# Patient Record
Sex: Female | Born: 1985 | Hispanic: No | Marital: Single | Smoking: Never smoker
Health system: Southern US, Community
[De-identification: ages and names within clinical notes are randomized; demographics above are authoritative.]

## PROBLEM LIST (undated history)

## (undated) DIAGNOSIS — G809 Cerebral palsy, unspecified: Secondary | ICD-10-CM

## (undated) DIAGNOSIS — K219 Gastro-esophageal reflux disease without esophagitis: Secondary | ICD-10-CM

## (undated) HISTORY — PX: OTHER SURGICAL HISTORY: SHX169

---
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
## 2007-07-20 ENCOUNTER — Encounter: Admission: RE | Admit: 2007-07-20 | Discharge: 2007-09-10 | Payer: Self-pay | Admitting: Physician Assistant

## 2011-12-24 ENCOUNTER — Emergency Department (HOSPITAL_COMMUNITY)
Admission: EM | Admit: 2011-12-24 | Discharge: 2011-12-24 | Disposition: A | Discharge status: Home / Self Care | Payer: Medicaid Other | Attending: Emergency Medicine | Admitting: Emergency Medicine

## 2011-12-24 ENCOUNTER — Encounter: Payer: Self-pay | Admitting: *Deleted

## 2011-12-24 DIAGNOSIS — L8992 Pressure ulcer of unspecified site, stage 2: Secondary | ICD-10-CM | POA: Insufficient documentation

## 2011-12-24 DIAGNOSIS — G809 Cerebral palsy, unspecified: Secondary | ICD-10-CM | POA: Insufficient documentation

## 2011-12-24 DIAGNOSIS — L8990 Pressure ulcer of unspecified site, unspecified stage: Secondary | ICD-10-CM | POA: Insufficient documentation

## 2011-12-24 HISTORY — DX: Gastro-esophageal reflux disease without esophagitis: K21.9

## 2011-12-24 HISTORY — DX: Cerebral palsy, unspecified: G80.9

## 2011-12-24 MED ORDER — DUODERM CGF DRESSING EX MISC: 1.0000 | Freq: Once | CUTANEOUS | Status: DC

## 2011-12-24 NOTE — ED Notes (Signed)
Restore placed to LT sided stage 2

## 2011-12-24 NOTE — ED Notes (Signed)
Pt has returned from bathroom on w/c.  Family assisting her back into stretcher bed.

## 2011-12-24 NOTE — ED Notes (Signed)
Pt states "noticed x days ago, where your butt meet your hip is open, also on right foot, there are dark spots"

## 2011-12-24 NOTE — ED Notes (Signed)
Spoke w/AC  About getting a duoderm or equivalent for the pt.  She will be bringing one to the floor shortly.

## 2011-12-24 NOTE — ED Provider Notes (Signed)
History     CSN: [id number]  Arrival date & time 12/24/11  1316   First MD Initiated Contact with Patient 12/24/11 1714      Chief Complaint  Patient presents with  . Skin Ulcer    (Consider location/radiation/quality/duration/timing/severity/associated sxs/prior treatment) HPI Comments: Patient with PMH significant for Cerebral Palsy.  She is currently wheel chair bound.  She comes in today today with a complaint of a skin ulcer on the left side of her lower buttocks.  Her father reports that she gets these sores frequently.  Patient reports that she usually applies barrier cream to the area and then it will resolve.  This ulcer has been present for 2 days.  She denies any drainage from the area.  She denies any fever/chills.  She reports that the area is tender to the touch.  The history is provided by the patient and a parent.    Past Medical History  Diagnosis Date  . Cerebral palsy   . Acid reflux     Past Surgical History  Procedure Date  . Dorsal rhizotomy   . Left arm surgery     No family history on file.  History  Substance Use Topics  . Smoking status: Never Smoker   . Smokeless tobacco: Not on file  . Alcohol Use: No    OB History    Grav Para Term Preterm Abortions TAB SAB Ect Mult Living                  Review of Systems  Constitutional: Negative for fever, chills and diaphoresis.  Respiratory: Negative for shortness of breath.   Gastrointestinal: Negative for nausea, vomiting and abdominal pain.  Musculoskeletal: Negative for back pain.  Skin: Positive for wound.    Allergies  Review of patient's allergies indicates no known allergies.  Home Medications   Current Outpatient Rx  Name Route Sig Dispense Refill  . NAPROXEN 250 MG PO TABS Oral Take 250 mg by mouth 2 (two) times daily with a meal.        BP 83/43  Pulse 71  Temp(Src) 98.4 F (36.9 C) (Oral)  Resp 17  Wt 132 lb 4.4 oz (60 kg)  SpO2 100%  LMP 12/21/2011  Physical  Exam  Nursing note and vitals reviewed. Constitutional: She is oriented to person, place, and time. She appears well-developed and well-nourished. No distress.  HENT:  Head: Normocephalic and atraumatic.  Cardiovascular: Normal rate, regular rhythm and normal heart sounds.   Pulmonary/Chest: Effort normal and breath sounds normal. No respiratory distress.  Abdominal: Soft. There is no tenderness.  Neurological: She is alert and oriented to person, place, and time.  Skin: Skin is warm and dry. She is not diaphoretic.     Psychiatric: She has a normal mood and affect.    ED Course  Procedures (including critical care time)  Labs Reviewed - No data to display No results found.   1. Pressure ulcer stage II     Duoderm applied to the wound.    MDM  Duoderm applied to the area.  Pressure ulcer was stage II.  Therefore, debridment was not needed at this time.   Patient instructed to follow up with Primary Care Physician.        Pascal Lux [facility] 12/27/11 1950

## 2011-12-24 NOTE — ED Notes (Signed)
Patient transported to bathroom on w/c.

## 2011-12-29 NOTE — ED Provider Notes (Signed)
Medical screening examination/treatment/procedure(s) were performed by non-physician practitioner and as supervising physician I was immediately available for consultation/collaboration.   Gerhard Munch, MD 12/29/11 1247

## 2016-05-23 ENCOUNTER — Encounter (HOSPITAL_COMMUNITY): Payer: Self-pay | Admitting: Emergency Medicine

## 2016-05-23 ENCOUNTER — Emergency Department (HOSPITAL_COMMUNITY)
Admission: EM | Admit: 2016-05-23 | Discharge: 2016-05-23 | Disposition: A | Discharge status: Home / Self Care | Payer: Medicaid Other | Attending: Emergency Medicine | Admitting: Emergency Medicine

## 2016-05-23 DIAGNOSIS — N939 Abnormal uterine and vaginal bleeding, unspecified: Secondary | ICD-10-CM | POA: Diagnosis present

## 2016-05-23 DIAGNOSIS — Z791 Long term (current) use of non-steroidal anti-inflammatories (NSAID): Secondary | ICD-10-CM | POA: Diagnosis not present

## 2016-05-23 DIAGNOSIS — N760 Acute vaginitis: Secondary | ICD-10-CM | POA: Diagnosis not present

## 2016-05-23 DIAGNOSIS — Z7982 Long term (current) use of aspirin: Secondary | ICD-10-CM | POA: Diagnosis not present

## 2016-05-23 DIAGNOSIS — B9689 Other specified bacterial agents as the cause of diseases classified elsewhere: Secondary | ICD-10-CM

## 2016-05-23 DIAGNOSIS — R300 Dysuria: Secondary | ICD-10-CM | POA: Diagnosis not present

## 2016-05-23 LAB — CBC WITH DIFFERENTIAL/PLATELET
BASOS ABS: 0 10*3/uL (ref 0.0–0.1)
Basophils Relative: 0 %
EOS ABS: 0.1 10*3/uL (ref 0.0–0.7)
Eosinophils Relative: 1 %
HEMATOCRIT: 31.5 % — AB (ref 36.0–46.0)
Hemoglobin: 8.7 g/dL — ABNORMAL LOW (ref 12.0–15.0)
LYMPHS ABS: 2.8 10*3/uL (ref 0.7–4.0)
Lymphocytes Relative: 30 %
MCH: 16 pg — AB (ref 26.0–34.0)
MCHC: 27.6 g/dL — AB (ref 30.0–36.0)
MCV: 57.9 fL — ABNORMAL LOW (ref 78.0–100.0)
Monocytes Absolute: 0.5 10*3/uL (ref 0.1–1.0)
Monocytes Relative: 5 %
NEUTROS ABS: 6 10*3/uL (ref 1.7–7.7)
Neutrophils Relative %: 64 %
Platelets: 187 10*3/uL (ref 150–400)
RBC: 5.44 MIL/uL — ABNORMAL HIGH (ref 3.87–5.11)
RDW: 21.1 % — AB (ref 11.5–15.5)
WBC: 9.4 10*3/uL (ref 4.0–10.5)

## 2016-05-23 LAB — WET PREP, GENITAL
SPERM: NONE SEEN
TRICH WET PREP: NONE SEEN
YEAST WET PREP: NONE SEEN

## 2016-05-23 LAB — BASIC METABOLIC PANEL
Anion gap: 7 (ref 5–15)
BUN: 20 mg/dL (ref 6–20)
CALCIUM: 9.1 mg/dL (ref 8.9–10.3)
CO2: 24 mmol/L (ref 22–32)
CREATININE: 0.39 mg/dL — AB (ref 0.44–1.00)
Chloride: 105 mmol/L (ref 101–111)
GFR calc non Af Amer: >60 mL/min (ref >60)
Glucose, Bld: 88 mg/dL (ref 65–99)
Potassium: 3.8 mmol/L (ref 3.5–5.1)
SODIUM: 136 mmol/L (ref 135–145)

## 2016-05-23 LAB — URINE MICROSCOPIC-ADD ON

## 2016-05-23 LAB — I-STAT BETA HCG BLOOD, ED (MC, WL, AP ONLY): I-stat hCG, quantitative: <5 m[IU]/mL (ref <5)

## 2016-05-23 LAB — URINALYSIS, ROUTINE W REFLEX MICROSCOPIC
Bilirubin Urine: NEGATIVE
Glucose, UA: NEGATIVE mg/dL
Ketones, ur: NEGATIVE mg/dL
NITRITE: NEGATIVE
Protein, ur: 30 mg/dL — AB
SPECIFIC GRAVITY, URINE: 1.03 (ref 1.005–1.030)
pH: 6 (ref 5.0–8.0)

## 2016-05-23 MED ORDER — METRONIDAZOLE 500 MG PO TABS: 500.0000 mg | ORAL_TABLET | Freq: Two times a day (BID) | ORAL | Status: DC

## 2016-05-23 NOTE — ED Provider Notes (Signed)
CSN: [id number]     Arrival date & time 05/23/16  1312 History   First MD Initiated Contact with Patient 05/23/16 1433     Chief Complaint  Patient presents with  . Vaginal Bleeding     (Consider location/radiation/quality/duration/timing/severity/associated sxs/prior Treatment) HPI Comments: Patient with PMH of cerebral palsy presents to the ED with a chief complaint of vaginal bleeding.  She states that she has been having bleeding for the past 2 weeks.  States that it felt like her normal menstrual cycle, but lasted longer.  Bleeding has now slowed down, but she still complains of some pain.  She states that it now hurts when she sits and when she urinates.  She denies SOB, dizziness, chest pain, or abdominal pain.  There are no modifying factors.  The history is provided by the patient. No language interpreter was used.    Past Medical History  Diagnosis Date  . Cerebral palsy (HCC)   . Acid reflux    Past Surgical History  Procedure Laterality Date  . Dorsal rhizotomy    . Left arm surgery     No family history on file. Social History  Substance Use Topics  . Smoking status: Never Smoker   . Smokeless tobacco: None  . Alcohol Use: No   OB History    No data available     Review of Systems  Constitutional: Negative for fever and chills.  Respiratory: Negative for shortness of breath.   Cardiovascular: Negative for chest pain.  Gastrointestinal: Negative for nausea, vomiting, diarrhea and constipation.  Genitourinary: Positive for dysuria and vaginal bleeding.  All other systems reviewed and are negative.     Allergies  Review of patient's allergies indicates no known allergies.  Home Medications   Prior to Admission medications   Medication Sig Start Date End Date Taking? Authorizing Provider  Control Gel Formula Dressing (DUODERM CGF DRESSING) MISC Apply 1 each topically once. 12/24/11   Heather Laisure, PA-C  naproxen (NAPROSYN) 250 MG tablet Take 250 mg  by mouth 2 (two) times daily with a meal.      Historical Provider, MD   BP 128/75 mmHg  Pulse 96  Temp(Src) 98.4 F (36.9 C) (Oral)  Resp 18  SpO2 100% Physical Exam  Constitutional: She is oriented to person, place, and time. She appears well-developed and well-nourished.  HENT:  Head: Normocephalic and atraumatic.  Eyes: Conjunctivae and EOM are normal. Pupils are equal, round, and reactive to light.  Neck: Normal range of motion. Neck supple.  Cardiovascular: Normal rate and regular rhythm.  Exam reveals no gallop and no friction rub.   No murmur heard. Pulmonary/Chest: Effort normal and breath sounds normal. No respiratory distress. She has no wheezes. She has no rales. She exhibits no tenderness.  Abdominal: Soft. Bowel sounds are normal. She exhibits no distension and no mass. There is no tenderness. There is no rebound and no guarding.  Genitourinary:  Patient unable to tolerate speculum exam, no active bleeding, on visual inspection and bimanual exam, non adnexal tenderness, external exam is remarkable a few small lesions which look like warts, no evidence of herpes  Musculoskeletal: She exhibits no edema or tenderness.  Minimal ROM and movement of lower extremities (baseline)  Neurological: She is alert and oriented to person, place, and time.  Skin: Skin is warm and dry.  Psychiatric: She has a normal mood and affect. Her behavior is normal. Judgment and thought content normal.  Nursing note and vitals reviewed.   ED  Course  Procedures (including critical care time) Results for orders placed or performed during the hospital encounter of 05/23/16  Wet prep, genital  Result Value Ref Range   Yeast Wet Prep HPF POC NONE SEEN NONE SEEN   Trich, Wet Prep NONE SEEN NONE SEEN   Clue Cells Wet Prep HPF POC PRESENT (A) NONE SEEN   WBC, Wet Prep HPF POC FEW (A) NONE SEEN   Sperm NONE SEEN   Urinalysis, Routine w reflex microscopic (not at [facility])  Result Value Ref Range    Color, Urine YELLOW YELLOW   APPearance HAZY (A) CLEAR   Specific Gravity, Urine 1.030 1.005 - 1.030   pH 6.0 5.0 - 8.0   Glucose, UA NEGATIVE NEGATIVE mg/dL   Hgb urine dipstick LARGE (A) NEGATIVE   Bilirubin Urine NEGATIVE NEGATIVE   Ketones, ur NEGATIVE NEGATIVE mg/dL   Protein, ur 30 (A) NEGATIVE mg/dL   Nitrite NEGATIVE NEGATIVE   Leukocytes, UA TRACE (A) NEGATIVE  CBC with Differential/Platelet  Result Value Ref Range   WBC 9.4 4.0 - 10.5 K/uL   RBC 5.44 (H) 3.87 - 5.11 MIL/uL   Hemoglobin 8.7 (L) 12.0 - 15.0 g/dL   HCT 16.1 (L) 09.6 - 04.5 %   MCV 57.9 (L) 78.0 - 100.0 fL   MCH 16.0 (L) 26.0 - 34.0 pg   MCHC 27.6 (L) 30.0 - 36.0 g/dL   RDW 40.9 (H) 81.1 - 91.4 %   Platelets 187 150 - 400 K/uL   Neutrophils Relative % 64 %   Lymphocytes Relative 30 %   Monocytes Relative 5 %   Eosinophils Relative 1 %   Basophils Relative 0 %   Neutro Abs 6.0 1.7 - 7.7 K/uL   Lymphs Abs 2.8 0.7 - 4.0 K/uL   Monocytes Absolute 0.5 0.1 - 1.0 K/uL   Eosinophils Absolute 0.1 0.0 - 0.7 K/uL   Basophils Absolute 0.0 0.0 - 0.1 K/uL   RBC Morphology ELLIPTOCYTES   Basic metabolic panel  Result Value Ref Range   Sodium 136 135 - 145 mmol/L   Potassium 3.8 3.5 - 5.1 mmol/L   Chloride 105 101 - 111 mmol/L   CO2 24 22 - 32 mmol/L   Glucose, Bld 88 65 - 99 mg/dL   BUN 20 6 - 20 mg/dL   Creatinine, Ser 7.82 (L) 0.44 - 1.00 mg/dL   Calcium 9.1 8.9 - 95.6 mg/dL   GFR calc non Af Amer >60 >60 mL/min   GFR calc Af Amer >60 >60 mL/min   Anion gap 7 5 - 15  Urine microscopic-add on  Result Value Ref Range   Squamous Epithelial / LPF 6-30 (A) NONE SEEN   WBC, UA 0-5 0 - 5 WBC/hpf   RBC / HPF 6-30 0 - 5 RBC/hpf   Bacteria, UA FEW (A) NONE SEEN   Urine-Other LESS THAN 10 mL OF URINE SUBMITTED   I-Stat Beta hCG blood, ED ([facility], [facility], [facility] only)  Result Value Ref Range   I-stat hCG, quantitative <5.0 <5 mIU/mL   Comment 3            I have personally reviewed and evaluated these lab results as part  of my medical decision-making.    MDM   Final diagnoses:  Abnormal uterine bleeding  BV (bacterial vaginosis)    Patient with complaint of vaginal bleeding and dysuria. The bleeding has mostly stopped the past few days, but was heavier than normal. Hemoglobin is 8.7, I have nothing to  compare this to. She is not tachycardic nor hypotensive.  Wet prep remarkable for clue cells. Will treat with vaginitis resolved. Patient discussed with Dr. Clayborne Dana.   Roxy Horseman, PA-C 05/23/16 1843  Marily Memos, MD 05/24/16 2250

## 2016-05-23 NOTE — Discharge Instructions (Signed)
Abnormal Uterine Bleeding °Abnormal uterine bleeding means bleeding from the vagina that is not your normal menstrual period. This can be: °· Bleeding or spotting between periods. °· Bleeding after sex (sexual intercourse). °· Bleeding that is heavier or more than normal. °· Periods that last longer than usual. °· Bleeding after menopause. °There are many problems that may cause this. Treatment will depend on the cause of the bleeding. Any kind of bleeding that is not normal should be reviewed by your doctor.  °HOME CARE °Watch your condition for any changes. These actions may lessen any discomfort you are having: °· Do not use tampons or douches as told by your doctor. °· Change your pads often. °You should get regular pelvic exams and Pap tests. Keep all appointments for tests as told by your doctor. °GET HELP IF: °· You are bleeding for more than 1 week. °· You feel dizzy at times. °GET HELP RIGHT AWAY IF:  °· You pass out. °· You have to change pads every 15 to 30 minutes. °· You have belly pain. °· You have a fever. °· You become sweaty or weak. °· You are passing large blood clots from the vagina. °· You feel sick to your stomach (nauseous) and throw up (vomit). °MAKE SURE YOU: °· Understand these instructions. °· Will watch your condition. °· Will get help right away if you are not doing well or get worse. °  °This information is not intended to replace advice given to you by your health care provider. Make sure you discuss any questions you have with your health care provider. °  °Document Released: 10/12/2009 Document Revised: 12/20/2013 Document Reviewed: 07/14/2013 °Elsevier Interactive Patient Education ©2016 Elsevier Inc. ° °Bacterial Vaginosis °Bacterial vaginosis is a vaginal infection that occurs when the normal balance of bacteria in the vagina is disrupted. It results from an overgrowth of certain bacteria. This is the most common vaginal infection in women of childbearing age. Treatment is  important to prevent complications, especially in pregnant women, as it can cause a premature delivery. °CAUSES  °Bacterial vaginosis is caused by an increase in harmful bacteria that are normally present in smaller amounts in the vagina. Several different kinds of bacteria can cause bacterial vaginosis. However, the reason that the condition develops is not fully understood. °RISK FACTORS °Certain activities or behaviors can put you at an increased risk of developing bacterial vaginosis, including: °· Having a new sex partner or multiple sex partners. °· Douching. °· Using an intrauterine device (IUD) for contraception. °Women do not get bacterial vaginosis from toilet seats, bedding, swimming pools, or contact with objects around them. °SIGNS AND SYMPTOMS  °Some women with bacterial vaginosis have no signs or symptoms. Common symptoms include: °· Grey vaginal discharge. °· A fishlike odor with discharge, especially after sexual intercourse. °· Itching or burning of the vagina and vulva. °· Burning or pain with urination. °DIAGNOSIS  °Your health care provider will take a medical history and examine the vagina for signs of bacterial vaginosis. A sample of vaginal fluid may be taken. Your health care provider will look at this sample under a microscope to check for bacteria and abnormal cells. A vaginal pH test may also be done.  °TREATMENT  °Bacterial vaginosis may be treated with antibiotic medicines. These may be given in the form of a pill or a vaginal cream. A second round of antibiotics may be prescribed if the condition comes back after treatment. Because bacterial vaginosis increases your risk for sexually transmitted diseases,   getting treated can help reduce your risk for chlamydia, gonorrhea, HIV, and herpes. °HOME CARE INSTRUCTIONS  °· Only take over-the-counter or prescription medicines as directed by your health care provider. °· If antibiotic medicine was prescribed, take it as directed. Make sure you  finish it even if you start to feel better. °· Tell all sexual partners that you have a vaginal infection. They should see their health care provider and be treated if they have problems, such as a mild rash or itching. °· During treatment, it is important that you follow these instructions: °¨ Avoid sexual activity or use condoms correctly. °¨ Do not douche. °¨ Avoid alcohol as directed by your health care provider. °¨ Avoid breastfeeding as directed by your health care provider. °SEEK MEDICAL CARE IF:  °· Your symptoms are not improving after 3 days of treatment. °· You have increased discharge or pain. °· You have a fever. °MAKE SURE YOU:  °· Understand these instructions. °· Will watch your condition. °· Will get help right away if you are not doing well or get worse. °FOR MORE INFORMATION  °Centers for Disease Control and Prevention, Division of STD Prevention: www.cdc.gov/std °American Sexual Health Association (ASHA): www.ashastd.org  °  °This information is not intended to replace advice given to you by your health care provider. Make sure you discuss any questions you have with your health care provider. °  °Document Released: 12/15/2005 Document Revised: 01/05/2015 Document Reviewed: 07/27/2013 °Elsevier Interactive Patient Education ©2016 Elsevier Inc. ° °

## 2016-05-23 NOTE — ED Notes (Signed)
Pt is in stable condition upon d/c and leaves ED in motorized wheelchair.

## 2016-05-23 NOTE — ED Notes (Addendum)
Vag bleed x 2 weeks now hurts to void  And hurts to sit, pt is in w/c no foley

## 2016-05-24 LAB — URINE CULTURE

## 2016-05-27 LAB — GC/CHLAMYDIA PROBE AMP (~~LOC~~) NOT AT ARMC
CHLAMYDIA, DNA PROBE: NEGATIVE
Neisseria Gonorrhea: NEGATIVE

## 2016-08-20 ENCOUNTER — Encounter (HOSPITAL_COMMUNITY): Payer: Self-pay | Admitting: Emergency Medicine

## 2016-08-20 ENCOUNTER — Emergency Department (HOSPITAL_COMMUNITY)
Admission: EM | Admit: 2016-08-20 | Discharge: 2016-08-20 | Disposition: A | Discharge status: Home / Self Care | Payer: Medicaid Other | Attending: Emergency Medicine | Admitting: Emergency Medicine

## 2016-08-20 DIAGNOSIS — Z7982 Long term (current) use of aspirin: Secondary | ICD-10-CM | POA: Insufficient documentation

## 2016-08-20 DIAGNOSIS — K920 Hematemesis: Secondary | ICD-10-CM | POA: Diagnosis present

## 2016-08-20 DIAGNOSIS — Z791 Long term (current) use of non-steroidal anti-inflammatories (NSAID): Secondary | ICD-10-CM | POA: Diagnosis not present

## 2016-08-20 DIAGNOSIS — G809 Cerebral palsy, unspecified: Secondary | ICD-10-CM | POA: Diagnosis not present

## 2016-08-20 MED ORDER — ONDANSETRON 4 MG PO TBDP
4.0000 mg | ORAL_TABLET | Freq: Once | ORAL | Status: AC
  Administered 2016-08-20: 4 mg via ORAL
  Filled 2016-08-20: qty 1

## 2016-08-20 MED ORDER — OMEPRAZOLE 20 MG PO CPDR: 20.0000 mg | DELAYED_RELEASE_CAPSULE | Freq: Every day | ORAL | 2 refills | Status: DC

## 2016-08-20 MED ORDER — ONDANSETRON 4 MG PO TBDP: 4.0000 mg | ORAL_TABLET | Freq: Three times a day (TID) | ORAL | 0 refills | Status: DC | PRN

## 2016-08-20 NOTE — ED Provider Notes (Signed)
[facility]-EMERGENCY DEPT Provider Note   CSN: [id number] Arrival date & time: 08/20/16  1317     History   Chief Complaint Chief Complaint  Patient presents with  . Hematemesis    HPI Christine Gomez is a 30 y.o. female.  HPI   Christine Gomez is a 30 y.o. female, with a history of GERD and cerebral palsy, presenting to the ED with Hematemesis. Patient states she was eating fried chicken in the [employer]. With her first bite, patient began to vomit blood. States that it was initially blood mixed with the food, but then became loud mixed with saliva. Patient states it felt as though is coming from the back of her throat. Patient adds that she has bad as her reflux, but has not taken any medications for it since her teenage years. Patient has instead been controlling it with dietary changes. However, patient adds that fried foods will exacerbate her GERD. Also endorses occasional nonbloody vomiting over the past couple weeks, especially when eating foods that she knows exacerbate her GERD. Patient has had multiple episodes in the past of hematemesis related to her GERD, but this episode frightened her because she has never had it happen with her first bite of food. Patient's only current complaint is nausea. Vomiting stopped prior to EMS arrival. Denies abdominal pain, chest pain, shortness of breath, diarrhea, or any other pain or complaints.  Patient is accompanied by her mother at bedside.      Past Medical History:  Diagnosis Date  . Acid reflux   . Cerebral palsy (HCC)     There are no active problems to display for this patient.   Past Surgical History:  Procedure Laterality Date  . dorsal rhizotomy    . left arm surgery      OB History    No data available       Home Medications    Prior to Admission medications   Medication Sig Start Date End Date Taking? Authorizing Provider  Aspirin-Salicylamide-Caffeine (BC HEADACHE POWDER PO) Take 1 packet by mouth 2 (two)  times daily as needed (pain).    Yes Historical Provider, MD  Ibuprofen (MIDOL) 200 MG CAPS Take 1 capsule by mouth 2 (two) times daily as needed (pain).    Yes Historical Provider, MD  Control Gel Formula Dressing (DUODERM CGF DRESSING) MISC Apply 1 each topically once. Patient not taking: Reported on 08/20/2016 12/24/11   Santiago Glad, PA-C  metroNIDAZOLE (FLAGYL) 500 MG tablet Take 1 tablet (500 mg total) by mouth 2 (two) times daily. Patient not taking: Reported on 08/20/2016 05/23/16   Roxy Horseman, PA-C  omeprazole (PRILOSEC) 20 MG capsule Take 1 capsule (20 mg total) by mouth daily. Take this medication 20-30 minutes prior to the first meal of the day with a full glass of water. 08/20/16   Jonhatan Hearty C Cinde Ebert, PA-C  ondansetron (ZOFRAN ODT) 4 MG disintegrating tablet Take 1 tablet (4 mg total) by mouth every 8 (eight) hours as needed for nausea or vomiting. 08/20/16   Anselm Pancoast, PA-C    Family History History reviewed. No pertinent family history.  Social History Social History  Substance Use Topics  . Smoking status: Never Smoker  . Smokeless tobacco: Never Used  . Alcohol use No     Allergies   Review of patient's allergies indicates no known allergies.   Review of Systems Review of Systems  Constitutional: Negative for chills, diaphoresis and fever.  Respiratory: Negative for shortness of breath.  Cardiovascular: Negative for chest pain.  Gastrointestinal: Positive for nausea and vomiting (resolved). Negative for abdominal pain.  All other systems reviewed and are negative.    Physical Exam Updated Vital Signs BP 127/88 (BP Location: Right Wrist)   Ht 4\' 9"  (1.448 m)   Wt 77.1 kg   LMP 08/20/2016   BMI 36.79 kg/m   Physical Exam  Constitutional: She appears well-developed and well-nourished. No distress.  HENT:  Head: Normocephalic and atraumatic.  Mouth/Throat: Oropharynx is clear and moist.  Eyes: Conjunctivae are normal.  Neck: Neck supple.    Cardiovascular: Normal rate, regular rhythm, normal heart sounds and intact distal pulses.   Pulmonary/Chest: Effort normal and breath sounds normal. No respiratory distress.  Abdominal: Soft. There is no tenderness. There is no guarding.  Musculoskeletal: She exhibits no edema or tenderness.  Lymphadenopathy:    She has no cervical adenopathy.  Neurological: She is alert.  Skin: Skin is warm and dry. She is not diaphoretic.  Psychiatric: She has a normal mood and affect. Her behavior is normal.  Nursing note and vitals reviewed.    ED Treatments / Results  Labs (all labs ordered are listed, but only abnormal results are displayed) Labs Reviewed  CBC WITH DIFFERENTIAL/PLATELET  COMPREHENSIVE METABOLIC PANEL    EKG  EKG Interpretation None       Radiology No results found.  Procedures Procedures (including critical care time)  Medications Ordered in ED Medications  ondansetron (ZOFRAN-ODT) disintegrating tablet 4 mg (4 mg Oral Given 08/20/16 1458)     Initial Impression / Assessment and Plan / ED Course  I have reviewed the triage vital signs and the nursing notes.  Pertinent labs & imaging results that were available during my care of the patient were reviewed by me and considered in my medical decision making (see chart for details).  Clinical Course    Christine Gomez presents with an episode of bloody emesis that occurred about an hour prior to arrival.  Suspect patient's symptoms are due to progression of her GERD. No vomiting in the presence of medical personnel or at all during her ED course. No abnormalities on exam. Attempts were made to obtain a blood sample to verify the patient's hemoglobin, however, patient is an extraordinarily difficult stick. There are not even veins readily noted on ultrasound. Patient is hemodynamically stable. Patient to follow up with GI. I placed the patient back on a PPI. Return precautions discussed. Patient voices understanding  of these instructions, accepts the plan, and is comfortable with discharge.  Note: Although the patient has cerebral palsy, this does not seem to affect her mentally and she is able to fully make her own decisions.  Vitals:   08/20/16 1329 08/20/16 1331 08/20/16 1507  BP:  127/88 116/66  Pulse:   73  Resp:   16  SpO2:   100%  Weight: 77.1 kg    Height: 4\' 9"  (1.448 m)       Final Clinical Impressions(s) / ED Diagnoses   Final diagnoses:  Hematemesis with nausea    New Prescriptions Discharge Medication List as of 08/20/2016  3:47 PM    START taking these medications   Details  omeprazole (PRILOSEC) 20 MG capsule Take 1 capsule (20 mg total) by mouth daily. Take this medication 20-30 minutes prior to the first meal of the day with a full glass of water., Starting Wed 08/20/2016, Print    ondansetron (ZOFRAN ODT) 4 MG disintegrating tablet Take 1 tablet (4 mg  total) by mouth every 8 (eight) hours as needed for nausea or vomiting., Starting Wed 08/20/2016, Print         Anselm PancoastShawn C Murphy Duzan, PA-C 08/20/16 1606    Benjiman CoreNathan Pickering, MD 08/20/16 [phone]

## 2016-08-20 NOTE — Discharge Instructions (Signed)
You have been seen today for vomiting blood. Follow-up with gastroenterology as soon as possible to establish care and for further assessment. Return to ED should symptoms worsen. Take the Prilosec daily, 20-30 minutes prior to the first meal of the day. Use Zofran as needed for nausea.

## 2016-08-20 NOTE — ED Notes (Signed)
Bed: [facility]20 Expected date:  Expected time:  Means of arrival:  Comments: EMS- 30yo F, emesis x 

## 2016-08-20 NOTE — ED Triage Notes (Signed)
[facility] EMS[facility] states she "ate a bite of chicken and began violently throwing up bright red blood." Pt reports 6 episodes of emesis in 30 minutes. Denies abdominal pain. Hx cerebral palsy

## 2016-08-20 NOTE — ED Notes (Signed)
PA at bedside.

## 2016-08-21 ENCOUNTER — Encounter: Payer: Self-pay | Admitting: Gastroenterology

## 2016-08-27 ENCOUNTER — Ambulatory Visit: Payer: Medicaid Other | Admitting: Gastroenterology

## 2016-09-02 ENCOUNTER — Encounter: Payer: Self-pay | Admitting: Physician Assistant

## 2016-10-03 ENCOUNTER — Encounter (HOSPITAL_COMMUNITY): Payer: Self-pay | Admitting: *Deleted

## 2016-10-06 ENCOUNTER — Other Ambulatory Visit: Payer: Self-pay | Admitting: Gastroenterology

## 2016-10-08 ENCOUNTER — Ambulatory Visit (HOSPITAL_COMMUNITY): Payer: Medicaid Other | Admitting: Anesthesiology

## 2016-10-08 ENCOUNTER — Encounter (HOSPITAL_COMMUNITY): Payer: Self-pay

## 2016-10-08 ENCOUNTER — Ambulatory Visit (HOSPITAL_COMMUNITY)
Admission: RE | Admit: 2016-10-08 | Discharge: 2016-10-08 | Disposition: A | Discharge status: Home / Self Care | Payer: Medicaid Other | Source: Ambulatory Visit | Attending: Gastroenterology | Admitting: Gastroenterology

## 2016-10-08 ENCOUNTER — Encounter (HOSPITAL_COMMUNITY)
Admission: RE | Disposition: A | Discharge status: Home / Self Care | Payer: Self-pay | Source: Ambulatory Visit | Attending: Gastroenterology

## 2016-10-08 DIAGNOSIS — G809 Cerebral palsy, unspecified: Secondary | ICD-10-CM | POA: Insufficient documentation

## 2016-10-08 DIAGNOSIS — K209 Esophagitis, unspecified: Secondary | ICD-10-CM | POA: Insufficient documentation

## 2016-10-08 DIAGNOSIS — R1310 Dysphagia, unspecified: Secondary | ICD-10-CM | POA: Diagnosis present

## 2016-10-08 DIAGNOSIS — K222 Esophageal obstruction: Secondary | ICD-10-CM | POA: Diagnosis not present

## 2016-10-08 DIAGNOSIS — K219 Gastro-esophageal reflux disease without esophagitis: Secondary | ICD-10-CM | POA: Insufficient documentation

## 2016-10-08 DIAGNOSIS — K449 Diaphragmatic hernia without obstruction or gangrene: Secondary | ICD-10-CM | POA: Insufficient documentation

## 2016-10-08 DIAGNOSIS — Z79899 Other long term (current) drug therapy: Secondary | ICD-10-CM | POA: Insufficient documentation

## 2016-10-08 HISTORY — PX: ESOPHAGOGASTRODUODENOSCOPY (EGD) WITH PROPOFOL: SHX5813

## 2016-10-08 SURGERY — ESOPHAGOGASTRODUODENOSCOPY (EGD) WITH PROPOFOL
Anesthesia: Monitor Anesthesia Care

## 2016-10-08 MED ORDER — LIDOCAINE 2% (20 MG/ML) 5 ML SYRINGE
INTRAMUSCULAR | Status: DC | PRN
  Administered 2016-10-08: 40 mg via INTRAVENOUS

## 2016-10-08 MED ORDER — SODIUM CHLORIDE 0.9 % IV SOLN: INTRAVENOUS | Status: DC

## 2016-10-08 MED ORDER — PROPOFOL 10 MG/ML IV BOLUS
INTRAVENOUS | Status: AC
  Filled 2016-10-08: qty 20

## 2016-10-08 MED ORDER — LACTATED RINGERS IV SOLN
INTRAVENOUS | Status: DC
  Administered 2016-10-08: 1000 mL via INTRAVENOUS

## 2016-10-08 MED ORDER — PROPOFOL 10 MG/ML IV BOLUS
INTRAVENOUS | Status: AC
Start: 2016-10-08 — End: 2016-10-08
  Filled 2016-10-08: qty 20

## 2016-10-08 MED ORDER — PROPOFOL 500 MG/50ML IV EMUL
INTRAVENOUS | Status: DC | PRN
  Administered 2016-10-08: 75 ug/kg/min via INTRAVENOUS

## 2016-10-08 MED ORDER — PROPOFOL 10 MG/ML IV BOLUS
INTRAVENOUS | Status: DC | PRN
  Administered 2016-10-08: 40 mg via INTRAVENOUS
  Administered 2016-10-08 (×2): 20 mg via INTRAVENOUS

## 2016-10-08 SURGICAL SUPPLY — 14 items

## 2016-10-08 NOTE — Op Note (Signed)
[facility] Patient Name: Christine Gomez Procedure Date: 10/08/2016 MRN: [id number] Attending MD: Willis Modena , MD Date of Birth: 1986/10/03 CSN: [id number] Age: 30 Admit Type: Outpatient Procedure:                Upper GI endoscopy Indications:              Dysphagia, Suspected gastro-esophageal reflux                            disease Providers:                Willis Modena, MD, Dwain Sarna, RN, Rolm Bookbinder, Technician, Sherald Barge, CRNA Referring MD:              Medicines:                Propofol per Anesthesia Complications:            No immediate complications. Estimated Blood Loss:     Estimated blood loss: none. Procedure:                Pre-Anesthesia Assessment:                           - Prior to the procedure, a History and Physical                            was performed, and patient medications and                            allergies were reviewed. The patient's tolerance of                            previous anesthesia was also reviewed. The risks                            and benefits of the procedure and the sedation                            options and risks were discussed with the patient.                            All questions were answered, and informed consent                            was obtained. Prior Anticoagulants: The patient has                            taken no previous anticoagulant or antiplatelet                            agents. ASA Grade Assessment: II - A patient with                            mild  systemic disease. After reviewing the risks                            and benefits, the patient was deemed in                            satisfactory condition to undergo the procedure.                           After obtaining informed consent, the endoscope was                            passed under direct vision. Throughout the                            procedure, the  patient's blood pressure, pulse, and                            oxygen saturations were monitored continuously. The                            [id number] ([id number]) scope was introduced through the                            mouth, and advanced to the second part of duodenum.                            The upper GI endoscopy was accomplished without                            difficulty. The patient tolerated the procedure                            well. Scope In: Scope Out: Findings:      A few mild (non-circumferential scarring) benign-appearing, intrinsic       stenoses were found. Diagnostic endoscope readily passed through this       area. Given this, neighboring esophagitis (see below) as well as       patient's lack of dysphagia, I elected not to dilate this area.      LA Grade B (one or more mucosal breaks greater than 5 mm, not extending       between the tops of two mucosal folds) esophagitis was found.       Predominant in the proximal- to mid-esophagus. Some scarring in the       proximal esophagus as well. Intestinal metaplasia of the esophagus was       not seen, but can't be definitively excluded.      A large complicated-appearing hiatal hernia was present.      There was free reflux from the stomach into the esophagus witnessed       during our exam. The exam of the esophagus was otherwise normal.      The entire examined stomach was normal.      The duodenal bulb, first portion of the duodenum and second portion of       the duodenum were normal.  Impression:               - Benign-appearing esophageal stenoses with                            associated inflammation. Overall indicative of                            longstanding acid reflux.                           - LA Grade B esophagitis.                           - Large complicated hiatal hernia.                           - Normal stomach.                           - Normal duodenal bulb, first portion of the                             duodenum and second portion of the duodenum. Moderate Sedation:      None Recommendation:           - Patient has a contact number available for                            emergencies. The signs and symptoms of potential                            delayed complications were discussed with the                            patient. Return to normal activities tomorrow.                            Written discharge instructions were provided to the                            patient.                           - Discharge patient to home (via wheelchair).                           - Resume previous diet today.                           - Follow an antireflux regimen indefinitely.                           - Head of bed up at least 30 degrees at all time.                            Small, frequent meals. Avoid late-night eating.  Routine dietary precautions (avoiding spicy,                            greasy, caffeinated, acidic products).                           - Use Dexilant (dexlansoprazole) 30 mg PO daily                            indefinitely.                           - Continue present medications.                           - Return to GI clinic in 4 months.                           - Return to referring physician as previously                            scheduled. Procedure Code(s):        --- Professional ---                           [id number], Esophagogastroduodenoscopy, flexible,                            transoral; diagnostic, including collection of                            specimen(s) by brushing or washing, when performed                            (separate procedure) Diagnosis Code(s):        --- Professional ---                           K22.2, Esophageal obstruction                           K20.9, Esophagitis, unspecified                           K44.9, Diaphragmatic hernia without obstruction or                             gangrene                           R13.10, Dysphagia, unspecified CPT copyright 2016 [employer]. All rights reserved. The codes documented in this report are preliminary and upon coder review may  be revised to meet current compliance requirements. Willis ModenaWilliam Ziyah Cordoba, MD 10/08/2016 10:06:13 AM This report has been signed electronically. Number of Addenda: 0

## 2016-10-08 NOTE — Transfer of Care (Signed)
Immediate Anesthesia Transfer of Care Note  Patient: Christine Gomez  Procedure(s) Performed: Procedure(s): ESOPHAGOGASTRODUODENOSCOPY (EGD) WITH PROPOFOL (N/A)  Patient Location: PACU  Anesthesia Type:MAC  Level of Consciousness:  sedated, patient cooperative and responds to stimulation  Airway & Oxygen Therapy:Patient Spontanous Breathing and Patient connected to face mask oxgen  Post-op Assessment:  Report given to PACU RN and Post -op Vital signs reviewed and stable  Post vital signs:  Reviewed and stable  Last Vitals:  Vitals:   10/08/16 0840  BP: (!) 192/142  Pulse: 70  Resp: 16  Temp: 36.8 C    Complications: No apparent anesthesia complications

## 2016-10-08 NOTE — H&P (Signed)
[facility] Admission Note  Chief Complaint: dysphagia  HPI: Christine Gomez is an 30 y.o. female.  For evaluation of dysphagia and odynophagia and hematemesis.  Resolved with Dexilant.  No abdominal pain or dysphagia or odynophagia or hematemesis at this time.  Past Medical History:  Diagnosis Date  . Acid reflux   . Cerebral palsy [facility](HCC)     Past Surgical History:  Procedure Laterality Date  . dorsal rhizotomy     back ligament looosening surgery  . left arm surgery      Medications Prior to Admission  Medication Sig Dispense Refill  . barrier cream (NON-SPECIFIED) CREA Apply 1 application topically 2 (two) times daily. Between buttocks and thighs area healing    . Dexlansoprazole 30 MG capsule Take 30 mg by mouth daily.    . Ibuprofen (MIDOL) 200 MG CAPS Take 1 capsule by mouth 2 (two) times daily as needed (pain).     . ondansetron (ZOFRAN ODT) 4 MG disintegrating tablet Take 1 tablet (4 mg total) by mouth every 8 (eight) hours as needed for nausea or vomiting. 20 tablet 0  . Aspirin-Salicylamide-Caffeine (BC HEADACHE POWDER PO) Take 1 packet by mouth 2 (two) times daily as needed (pain).     . Control Gel Formula Dressing (DUODERM CGF DRESSING) MISC Apply 1 each topically once. (Patient not taking: Reported on 10/08/2016) 5 each 0  . metroNIDAZOLE (FLAGYL) 500 MG tablet Take 1 tablet (500 mg total) by mouth 2 (two) times daily. (Patient not taking: Reported on 08/20/2016) 14 tablet 0  . omeprazole (PRILOSEC) 20 MG capsule Take 1 capsule (20 mg total) by mouth daily. Take this medication 20-30 minutes prior to the first meal of the day with a full glass of water. 30 capsule 2    Allergies: No Known Allergies  History reviewed. No pertinent family history.  Social History:  reports that she has never smoked. She has never used smokeless tobacco. She reports that she does not drink alcohol or use drugs.   ROS: As per HPI, all others negative   Blood pressure (!)  192/142, pulse 70, temperature 98.3 F (36.8 C), temperature source Oral, resp. rate 16, height 4\' 9"  (1.448 m), weight 77.1 kg (170 lb), last menstrual period 09/27/2016, SpO2 100 %. General appearance: in wheelchair, overweight, NAD Cardio: Regular GI: Soft, non-tender Neurologic: alert and oriented  No results found for this or any previous visit (from the past 48 hour(s)). No results found.  Assessment/Plan  1.  Dysphagia and odynophagia and hematemesis.  Resolved. 2.  Plan for endoscopy with possible esophageal dilatation as next step in management. 3.  Risks (bleeding, infection, bowel perforation that could require surgery, sedation-related changes in cardiopulmonary systems), benefits (identification and possible treatment of source of symptoms, exclusion of certain causes of symptoms), and alternatives (watchful waiting, radiographic imaging studies, empiric medical treatment) of upper endoscopy with possible esophageal dilatation (EGD + DIL) were explained to patient/family in detail and patient wishes to proceed.  Freddy JakschOUTLAW,Maurilio Puryear M 10/08/2016, 9:36 AM

## 2016-10-08 NOTE — Anesthesia Preprocedure Evaluation (Addendum)
Anesthesia Evaluation  Patient identified by MRN, date of birth, ID band Patient awake    Reviewed: Allergy & Precautions, NPO status , Patient's Chart, lab work & pertinent test results  Airway Mallampati: I  TM Distance: >3 FB Neck ROM: Full    Dental  (+) Teeth Intact, Dental Advisory Given   Pulmonary neg pulmonary ROS,    breath sounds clear to auscultation       Cardiovascular negative cardio ROS   Rhythm:Regular Rate:Normal     Neuro/Psych negative neurological ROS  negative psych ROS   GI/Hepatic Neg liver ROS, GERD  Medicated,  Endo/Other  negative endocrine ROS  Renal/GU negative Renal ROS  negative genitourinary   Musculoskeletal negative musculoskeletal ROS (+)   Abdominal   Peds negative pediatric ROS (+)  Hematology negative hematology ROS (+)   Anesthesia Other Findings   Reproductive/Obstetrics negative OB ROS                            Anesthesia Physical Anesthesia Plan  ASA: II  Anesthesia Plan: MAC   Post-op Pain Management:    Induction: Intravenous  Airway Management Planned: Natural Airway  Additional Equipment:   Intra-op Plan:   Post-operative Plan:   Informed Consent: I have reviewed the patients History and Physical, chart, labs and discussed the procedure including the risks, benefits and alternatives for the proposed anesthesia with the patient or authorized representative who has indicated his/her understanding and acceptance.     Plan Discussed with: CRNA  Anesthesia Plan Comments:        Anesthesia Quick Evaluation

## 2016-10-08 NOTE — Discharge Instructions (Signed)
Endoscopy °Care After °Please read the instructions outlined below and refer to this sheet in the next few weeks. These discharge instructions provide you with general information on caring for yourself after you leave the hospital. Your doctor may also give you specific instructions. While your treatment has been planned according to the most current medical practices available, unavoidable complications occasionally occur. If you have any problems or questions after discharge, please call Dr. Nikeisha Klutz ([facility]) at [phone]. ° °HOME CARE INSTRUCTIONS °Activity °· You may resume your regular activity but move at a slower pace for the next 24 hours.  °· Take frequent rest periods for the next 24 hours.  °· Walking will help expel (get rid of) the air and reduce the bloated feeling in your abdomen.  °· No driving for 24 hours (because of the anesthesia (medicine) used during the test).  °· You may shower.  °· Do not sign any important legal documents or operate any machinery for 24 hours (because of the anesthesia used during the test).  °Nutrition °· Drink plenty of fluids.  °· You may resume your normal diet.  °· Begin with a light meal and progress to your normal diet.  °· Avoid alcoholic beverages for 24 hours or as instructed by your caregiver.  °Medications °You may resume your normal medications unless your caregiver tells you otherwise. °What you can expect today °· You may experience abdominal discomfort such as a feeling of fullness or "gas" pains.  °· You may experience a sore throat for 2 to 3 days. This is normal. Gargling with salt water may help this.  °·  °SEEK IMMEDIATE MEDICAL CARE IF: °· You have excessive nausea (feeling sick to your stomach) and/or vomiting.  °· You have severe abdominal pain and distention (swelling).  °· You have trouble swallowing.  °· You have a temperature over 100° F (37.8° C).  °· You have rectal bleeding or vomiting of blood.  °Document Released:  07/29/2004 Document Revised: 08/27/2011 Document Reviewed: 02/09/2008 °[employer]® Patient Information ©2012 [employer]. °

## 2016-10-08 NOTE — Anesthesia Postprocedure Evaluation (Signed)
Anesthesia Post Note  Patient: Christine Gomez  Procedure(s) Performed: Procedure(s) (LRB): ESOPHAGOGASTRODUODENOSCOPY (EGD) WITH PROPOFOL (N/A)  Patient location during evaluation: PACU Anesthesia Type: MAC Level of consciousness: awake and alert Pain management: pain level controlled Vital Signs Assessment: post-procedure vital signs reviewed and stable Respiratory status: spontaneous breathing, nonlabored ventilation, respiratory function stable and patient connected to nasal cannula oxygen Cardiovascular status: stable and blood pressure returned to baseline Anesthetic complications: no    Last Vitals:  Vitals:   10/08/16 1005 10/08/16 1010  BP:    Pulse: 87   Resp: 16   Temp:  36.5 C    Last Pain:  Vitals:   10/08/16 1010  TempSrc: Oral                 Shelton SilvasKevin D Keven Osborn

## 2016-10-10 ENCOUNTER — Encounter (HOSPITAL_COMMUNITY): Payer: Self-pay | Admitting: Gastroenterology

## 2017-07-10 ENCOUNTER — Ambulatory Visit (HOSPITAL_BASED_OUTPATIENT_CLINIC_OR_DEPARTMENT_OTHER): Payer: Medicaid Other

## 2017-07-10 ENCOUNTER — Telehealth: Payer: Self-pay | Admitting: Hematology and Oncology

## 2017-07-10 ENCOUNTER — Ambulatory Visit (HOSPITAL_BASED_OUTPATIENT_CLINIC_OR_DEPARTMENT_OTHER): Payer: Medicaid Other | Admitting: Hematology and Oncology

## 2017-07-10 ENCOUNTER — Encounter: Payer: Self-pay | Admitting: Hematology and Oncology

## 2017-07-10 VITALS — BP 116/59 | HR 88 | Temp 98.3°F | Resp 18 | Ht <= 58 in | Wt 175.0 lb

## 2017-07-10 DIAGNOSIS — D500 Iron deficiency anemia secondary to blood loss (chronic): Secondary | ICD-10-CM | POA: Insufficient documentation

## 2017-07-10 DIAGNOSIS — D509 Iron deficiency anemia, unspecified: Secondary | ICD-10-CM

## 2017-07-10 LAB — CBC & DIFF AND RETIC
BASO%: 0.4 % (ref 0.0–2.0)
BASOS ABS: 0 10*3/uL (ref 0.0–0.1)
EOS ABS: 0.1 10*3/uL (ref 0.0–0.5)
EOS%: 1.6 % (ref 0.0–7.0)
HCT: 29.5 % — ABNORMAL LOW (ref 34.8–46.6)
HEMOGLOBIN: 8.2 g/dL — AB (ref 11.6–15.9)
IMMATURE RETIC FRACT: 10.4 % — AB (ref 1.60–10.00)
LYMPH%: 29.4 % (ref 14.0–49.7)
MCH: 15.6 pg — ABNORMAL LOW (ref 25.1–34.0)
MCHC: 27.8 g/dL — ABNORMAL LOW (ref 31.5–36.0)
MCV: 56 fL — AB (ref 79.5–101.0)
MONO#: 0.5 10*3/uL (ref 0.1–0.9)
MONO%: 7.2 % (ref 0.0–14.0)
NEUT#: 4.5 10*3/uL (ref 1.5–6.5)
NEUT%: 61.4 % (ref 38.4–76.8)
NRBC: 0 % (ref 0–0)
PLATELETS: 179 10*3/uL (ref 145–400)
RBC: 5.27 10*6/uL (ref 3.70–5.45)
RDW: 21.1 % — ABNORMAL HIGH (ref 11.2–14.5)
Retic %: 1.3 % (ref 0.70–2.10)
Retic Ct Abs: 68.51 10*3/uL (ref 33.70–90.70)
WBC: 7.4 10*3/uL (ref 3.9–10.3)
lymph#: 2.2 10*3/uL (ref 0.9–3.3)

## 2017-07-10 LAB — COMPREHENSIVE METABOLIC PANEL
ALBUMIN: 3.6 g/dL (ref 3.5–5.0)
ALK PHOS: 80 U/L (ref 40–150)
ALT: 10 U/L (ref 0–55)
AST: 13 U/L (ref 5–34)
Anion Gap: 12 mEq/L — ABNORMAL HIGH (ref 3–11)
BILIRUBIN TOTAL: 0.23 mg/dL (ref 0.20–1.20)
BUN: 14.4 mg/dL (ref 7.0–26.0)
CO2: 24 mEq/L (ref 22–29)
CREATININE: 0.6 mg/dL (ref 0.6–1.1)
Calcium: 9.3 mg/dL (ref 8.4–10.4)
Chloride: 107 mEq/L (ref 98–109)
GLUCOSE: 97 mg/dL (ref 70–140)
Potassium: 3.7 mEq/L (ref 3.5–5.1)
Sodium: 143 mEq/L (ref 136–145)
TOTAL PROTEIN: 7.5 g/dL (ref 6.4–8.3)

## 2017-07-10 NOTE — Telephone Encounter (Signed)
Scheduled appt per 7/13 los- Gave patient AVS and calender per los. - and release form for labs from PCP.

## 2017-07-10 NOTE — Assessment & Plan Note (Signed)
31 year old female with microcytic hypochromic anemia with peripheral blood characterized by presence of elliptocytes, target cells, and occasional teardrop cells in the setting of previous history of upper GI bleeding and heavy menstrual periods.  High on our differential is anemia of iron deficiency. I am not sure if iron levels have been checked by her primary care provider or a gastroenterologist as I do not have any of the lab work of the kinesin to us. Recent available lab work from referral documents dates back to May 2017.  In this situation, we will obtain blood work including CBC with differential, CMP, iron panel, and hemoglobin electrophoresis.  Plan: --Labs as outlined below --Patient can start iron sulfate 1 tablet daily for one week then 2 tablets daily for 1 week and then, if tolerated, up to 3 tablets per day. The patient has hard time swallowing the pills, we will adjust oral supplementation chewable gummy forms or liquid forms of iron. Monthly, if absorption of the iron is poor or if the patient shows ability to tolerate adequate oral supplementation, we can consider intravenous iron supplementation. --RTC 1 week for labwork review  Voice recognition software was used and creation of this note. Despite my best effort at editing the text, some misspelling/errors may have occurred.

## 2017-07-10 NOTE — Progress Notes (Signed)
[facility] New Visit:  Assessment: Microcytic anemia 31 year old female with microcytic hypochromic anemia with peripheral blood characterized by presence of elliptocytes, target cells, and occasional teardrop cells in the setting of previous history of upper GI bleeding and heavy menstrual periods.  High on our differential is anemia of iron deficiency. I am not sure if iron levels have been checked by her primary care provider or a gastroenterologist as I do not have any of the lab work of the kinesin to [place]. Recent available lab work from referral documents dates back to May 2017.  In this situation, we will obtain blood work including CBC with differential, CMP, iron panel, and hemoglobin electrophoresis.  Plan: --Labs as outlined below --Patient can start iron sulfate 1 tablet daily for one week then 2 tablets daily for 1 week and then, if tolerated, up to 3 tablets per day. The patient has hard time swallowing the pills, we will adjust oral supplementation chewable gummy forms or liquid forms of iron. Monthly, if absorption of the iron is poor or if the patient shows ability to tolerate adequate oral supplementation, we can consider intravenous iron supplementation. --RTC 1 week for labwork review  Voice recognition software was used and creation of this note. Despite my best effort at editing the text, some misspelling/errors may have occurred.   Orders Placed This Encounter  Procedures  . CBC & Diff and Retic    Standing Status:   Future    Standing Expiration Date:   07/10/2018  . Ferritin    Standing Status:   Future    Standing Expiration Date:   07/10/2018  . Iron and TIBC    Standing Status:   Future    Standing Expiration Date:   07/10/2018  . Hemoglobinopathy evaluation    Standing Status:   Future    Standing Expiration Date:   07/10/2018  . Comprehensive metabolic panel    Standing Status:   Future    Standing Expiration Date:   07/10/2018    All  questions were answered.  . The patient knows to call the clinic with any problems, questions or concerns.  This note was electronically signed.    History of Presenting Illness Christine Gomez 31 y.o. presenting to the [facility] for Additional evaluation of diagnosis of anemia. Patient has a history of palsy, but remains very highly functional and active. The only available CBC dates back to May 2017 when patient was found to have hemoglobin of 8.7 with an presence of elliptocytes, target cells, and teardrop cells. It appears that the anemia time was attributed to heavy menstrual periods. In October 2017, patient had an episode of progressive dysphagia with abrupt onset, hematemesis. Patient was evaluated with endoscopy with results reported below. To my knowledge, blood counts have not been repeated at that time. Patient reports that she had another blood draw done approximately 3 weeks ago, but the results of that evaluation are not immediately available at this time. Based on the results of the recent blood work, patient was started on iron, which she has not started taking it yet, and was referred to [place] with question of possible presence of hemoglobinopathy or other causes of anemia.  The present time, patient denies any active complaints. She denies lightheadedness, dizziness. No fevers, chills or night sweats. No appetite changes or activity tolerance change. Denies any epistaxis, hemoptysis, hematemesis, hemoptysis, melena, or hematochezia. Reports regular menstrual periods. Patient denies any family history of anemia, bleeding disorder,  or hemoglobinopathy to her knowledge.  Oncological/hematological History: --Labs, 05/23/16: WBC 9.4, Hgb 8.7, MCV 57.9, MCH 16.0, RDW 21.1, Plt 187; pBlood smear --  Positive for elliptocytes, target cells and teardrop cells;  --EGD, 10/08/16: LA grade B esophagitis in the proximal to mid esophagus without intestinal metaplasia. Her multiple appearance of  stomach and duodenum.  Medical History: Past Medical History:  Diagnosis Date  . Acid reflux   . Cerebral palsy [facility])     Surgical History: Past Surgical History:  Procedure Laterality Date  . dorsal rhizotomy     back ligament looosening surgery  . ESOPHAGOGASTRODUODENOSCOPY (EGD) WITH PROPOFOL N/A 10/08/2016   Procedure: ESOPHAGOGASTRODUODENOSCOPY (EGD) WITH PROPOFOL;  Surgeon: Arta Silence, MD;  Location: [facility];  Service: Endoscopy;  Laterality: N/A;  . left arm surgery      Family History: Family History  Problem Relation Age of Onset  . Diabetes Father   . Cancer Maternal Grandmother        Breast  . Diabetes Maternal Grandfather   . Diabetes Paternal Grandfather     Social History: Social History   Social History  . Marital status: Single    Spouse name: N/A  . Number of children: N/A  . Years of education: N/A   Occupational History  . Not on file.   Social History Main Topics  . Smoking status: Never Smoker  . Smokeless tobacco: Never Used  . Alcohol use No  . Drug use: No  . Sexual activity: No   Other Topics Concern  . Not on file   Social History Narrative  . No narrative on file    Allergies: No Known Allergies  Medications:  Current Outpatient Prescriptions  Medication Sig Dispense Refill  . naproxen sodium (ANAPROX) 220 MG tablet Take 220 mg by mouth as needed.    . pantoprazole (PROTONIX) 40 MG tablet Take 40 mg by mouth daily.     No current facility-administered medications for this visit.     Review of Systems: Review of Systems  All other systems reviewed and are negative.    PHYSICAL EXAMINATION Blood pressure (!) 116/59, pulse 88, temperature 98.3 F (36.8 C), temperature source Oral, resp. rate 18, height _0  (1.448 m), weight 175 lb (79.4 kg), SpO2 100 %.  ECOG PERFORMANCE STATUS: 0 - Asymptomatic  Physical Exam  Constitutional:  Wheel-chair bound, African-American female who appears to be in no acute  distress. Alert, awake, Oriented 3.  HENT:  Mouth/Throat: Oropharynx is clear and moist. No oropharyngeal exudate.  Eyes: Pupils are equal, round, and reactive to light. EOM are normal. No scleral icterus.  Cardiovascular: Normal rate and regular rhythm.  Exam reveals no gallop.   No murmur heard. Pulmonary/Chest: Breath sounds normal. She has no wheezes.  Abdominal: Soft. She exhibits no distension and no mass.  No hepatosplenomegaly  Musculoskeletal: She exhibits no edema.  Lymphadenopathy:    She has no cervical adenopathy.  Skin: Skin is warm and dry. No rash noted. No erythema.     LABORATORY DATA: I have personally reviewed the data as listed: No visits with results within 1 Week(s) from this visit.  Latest known visit with results is:  Admission on 05/23/2016, Discharged on 05/23/2016  Component Date Value Ref Range Status  . Color, Urine 05/23/2016 YELLOW  YELLOW Final  . APPearance 05/23/2016 HAZY* CLEAR Final  . Specific Gravity, Urine 05/23/2016 1.030  1.005 - 1.030 Final  . pH 05/23/2016 6.0  5.0 - 8.0 Final  .  Glucose, UA 05/23/2016 NEGATIVE  NEGATIVE mg/dL Final  . Hgb urine dipstick 05/23/2016 LARGE* NEGATIVE Final  . Bilirubin Urine 05/23/2016 NEGATIVE  NEGATIVE Final  . Ketones, ur 05/23/2016 NEGATIVE  NEGATIVE mg/dL Final  . Protein, ur 05/23/2016 30* NEGATIVE mg/dL Final  . Nitrite 05/23/2016 NEGATIVE  NEGATIVE Final  . Leukocytes, UA 05/23/2016 TRACE* NEGATIVE Final  . Yeast Wet Prep HPF POC 05/23/2016 NONE SEEN  NONE SEEN Final  . Trich, Wet Prep 05/23/2016 NONE SEEN  NONE SEEN Final  . Clue Cells Wet Prep HPF POC 05/23/2016 PRESENT* NONE SEEN Final  . WBC, Wet Prep HPF POC 05/23/2016 FEW* NONE SEEN Final  . Sperm 05/23/2016 NONE SEEN   Final  . Chlamydia 05/23/2016 Negative   Final   Normal Reference Range - Negative  . Neisseria gonorrhea 05/23/2016 Negative   Final   Normal Reference Range - Negative  . WBC 05/23/2016 9.4  4.0 - 10.5 K/uL Final  .  RBC 05/23/2016 5.44* 3.87 - 5.11 MIL/uL Final  . Hemoglobin 05/23/2016 8.7* 12.0 - 15.0 g/dL Final  . HCT 05/23/2016 31.5* 36.0 - 46.0 % Final  . MCV 05/23/2016 57.9* 78.0 - 100.0 fL Final  . MCH 05/23/2016 16.0* 26.0 - 34.0 pg Final  . MCHC 05/23/2016 27.6* 30.0 - 36.0 g/dL Final  . RDW 05/23/2016 21.1* 11.5 - 15.5 % Final  . Platelets 05/23/2016 187  150 - 400 K/uL Final   Comment: REPEATED TO VERIFY PLATELET COUNT CONFIRMED BY SMEAR   . Neutrophils Relative % 05/23/2016 64  % Final  . Lymphocytes Relative 05/23/2016 30  % Final  . Monocytes Relative 05/23/2016 5  % Final  . Eosinophils Relative 05/23/2016 1  % Final  . Basophils Relative 05/23/2016 0  % Final  . Neutro Abs 05/23/2016 6.0  1.7 - 7.7 K/uL Final  . Lymphs Abs 05/23/2016 2.8  0.7 - 4.0 K/uL Final  . Monocytes Absolute 05/23/2016 0.5  0.1 - 1.0 K/uL Final  . Eosinophils Absolute 05/23/2016 0.1  0.0 - 0.7 K/uL Final  . Basophils Absolute 05/23/2016 0.0  0.0 - 0.1 K/uL Final  . RBC Morphology 05/23/2016 ELLIPTOCYTES   Final   Comment: POLYCHROMASIA PRESENT TARGET CELLS TEARDROP CELLS   . Sodium 05/23/2016 136  135 - 145 mmol/L Final  . Potassium 05/23/2016 3.8  3.5 - 5.1 mmol/L Final  . Chloride 05/23/2016 105  101 - 111 mmol/L Final  . CO2 05/23/2016 24  22 - 32 mmol/L Final  . Glucose, Bld 05/23/2016 88  65 - 99 mg/dL Final  . BUN 05/23/2016 20  6 - 20 mg/dL Final  . Creatinine, Ser 05/23/2016 0.39* 0.44 - 1.00 mg/dL Final  . Calcium 05/23/2016 9.1  8.9 - 10.3 mg/dL Final  . GFR calc non Af Amer 05/23/2016 >60  >60 mL/min Final  . GFR calc Af Amer 05/23/2016 >60  >60 mL/min Final   Comment: (NOTE) The eGFR has been calculated using the CKD EPI equation. This calculation has not been validated in all clinical situations. eGFR's persistently <60 mL/min signify possible Chronic Kidney Disease.   . Anion gap 05/23/2016 7  5 - 15 Final  . I-stat hCG, quantitative 05/23/2016 <5.0  <5 mIU/mL Final  . Comment 3  05/23/2016          Final   Comment:   GEST. AGE      CONC.  (mIU/mL)   <=1 WEEK        5 - 50  2 WEEKS       50 - 500     3 WEEKS       100 - 10,000     4 WEEKS     1,000 - 30,000        FEMALE AND NON-PREGNANT FEMALE:     LESS THAN 5 mIU/mL   . Squamous Epithelial / LPF 05/23/2016 6-30* NONE SEEN Final  . WBC, UA 05/23/2016 0-5  0 - 5 WBC/hpf Final  . RBC / HPF 05/23/2016 6-30  0 - 5 RBC/hpf Final  . Bacteria, UA 05/23/2016 FEW* NONE SEEN Final  . Urine-Other 05/23/2016 LESS THAN 10 mL OF URINE SUBMITTED   Final   Comment: MICROSCOPIC EXAM PERFORMED ON UNCONCENTRATED URINE MUCOUS PRESENT   . Specimen Description 05/23/2016 URINE, CATHETERIZED   Final  . Special Requests 05/23/2016 NONE   Final  . Culture 05/23/2016 MULTIPLE SPECIES PRESENT, SUGGEST RECOLLECTION*  Final  . Report Status 05/23/2016 05/24/2016 FINAL   Final         Ardath Sax, MD

## 2017-07-13 LAB — IRON AND TIBC
%SAT: 3 % — AB (ref 21–57)
Iron: 11 ug/dL — ABNORMAL LOW (ref 41–142)
TIBC: 383 ug/dL (ref 236–444)
UIBC: 371 ug/dL (ref 120–384)

## 2017-07-13 LAB — FERRITIN: Ferritin: <4 ng/ml — ABNORMAL LOW (ref 9–269)

## 2017-07-16 LAB — HEMOGLOBINOPATHY EVALUATION
HEMOGLOBIN A2 QUANTITATION: 1.4 % — AB (ref 1.8–3.2)
HGB C: 0 %
HGB S: 0 %
HGB VARIANT: 0 %
Hemoglobin F Quantitation: 0 % (ref 0.0–2.0)
Hgb A: 98.6 % (ref 96.4–98.8)

## 2017-07-17 ENCOUNTER — Ambulatory Visit (HOSPITAL_BASED_OUTPATIENT_CLINIC_OR_DEPARTMENT_OTHER): Payer: Medicaid Other | Admitting: Hematology and Oncology

## 2017-07-17 ENCOUNTER — Encounter: Payer: Self-pay | Admitting: Hematology and Oncology

## 2017-07-17 VITALS — BP 110/69 | HR 82 | Temp 98.6°F | Resp 20 | Ht <= 58 in

## 2017-07-17 DIAGNOSIS — D509 Iron deficiency anemia, unspecified: Secondary | ICD-10-CM

## 2017-07-17 DIAGNOSIS — D500 Iron deficiency anemia secondary to blood loss (chronic): Secondary | ICD-10-CM

## 2017-07-17 MED ORDER — FERROUS GLUCONATE 324 (38 FE) MG PO TABS: 324.0000 mg | ORAL_TABLET | Freq: Three times a day (TID) | ORAL | 3 refills | Status: DC

## 2017-07-17 NOTE — Assessment & Plan Note (Signed)
31 year old female with microcytic hypochromic anemia with peripheral blood characterized by presence of elliptocytes, target cells, and occasional teardrop cells in the setting of previous history of upper GI bleeding and heavy menstrual periods. Lab work obtained at the last visit to the clinic confirms presence of severe deficiency likely causing the anemia with undetectable ferritin level indicating near complete exhaustion of the iron stores. Patient was started on oral iron supplementation, but has been experiencing significant difficulties with nausea and constipation even at one tablet of iron sulfate per day. This is unlikely to be a tolerable treatment for this particular patient.   Plan: --Change iron supplement to iron gluconate, patient will start with one tablet per day and increase as tolerated up to 3 tablets per day --Patient will call us in the clinic if not tolerating the supplementation by mouth. --RTC 4weeks with labs to assess illness on oral iron supplementation. If patient's iron stores and hemoglobin and not responding, we'll consider parenteral iron supplementation at that time.  Voice recognition software was used and creation of this note. Despite my best effort at editing the text, some misspelling/errors may have occurred.

## 2017-07-17 NOTE — Progress Notes (Signed)
[facility] Cancer Follow-up Visit:  Assessment: No problem-specific Assessment & Plan notes found for this encounter.   Orders Placed This Encounter  Procedures  . CBC & Diff and Retic    Standing Status:   Future    Standing Expiration Date:   07/17/2018  . Ferritin    Standing Status:   Future    Standing Expiration Date:   07/17/2018  . Iron and TIBC    Standing Status:   Future    Standing Expiration Date:   07/17/2018  . Comprehensive metabolic panel    Standing Status:   Future    Standing Expiration Date:   07/17/2018    All questions were answered.  . The patient knows to call the clinic with any problems, questions or concerns.  This note was electronically signed.    History of Presenting Illness Christine Gomez 31 y.o. returning [facility] the [facility] for evaluation of diagnosis of anemia.   Patient has a history of cerebral palsy, but remains very highly functional and active. The only available CBC dates back to May 2017 when patient was found to have hemoglobin of 8.7 with an presence of elliptocytes, target cells, and teardrop cells. It appears that the anemia time was attributed to heavy menstrual periods. In October 2017, patient had an episode of progressive dysphagia with abrupt onset, hematemesis. Patient was evaluated with endoscopy with results reported below. To my knowledge, blood counts have not been repeated at that time. Patient reports that she had another blood draw done approximately 3 weeks ago, but the results of that evaluation are not immediately available at this time. Based on the results of the recent blood work, patient was started on iron, which she has not started taking it yet, and was referred to us with question of possible presence of hemoglobinopathy or other causes of anemia.  The present time, patient denies any active complaints. She denies lightheadedness, dizziness. No fevers, chills or night sweats. No appetite changes or  activity tolerance change. Denies any epistaxis, hemoptysis, hematemesis, hemoptysis, melena, or hematochezia. Reports regular menstrual periods. Patient denies any family history of anemia, bleeding disorder, or hemoglobinopathy to her knowledge.  At the last visit to the clinic, patient was started on oral ferrous sulfate supplementation. She has taken 1 pill per day over the past week, but has difficulty continuing due to the Gleevec and nausea without vomiting and constipation caused by the medication. Otherwise, patient denies any new complaints.  Oncological/hematological History: --Labs, 05/23/16: WBC 9.4, Hgb 8.7, MCV 57.9, MCH 16.0, RDW 21.1, Plt 187; pBlood smear --  Positive for elliptocytes, target cells and teardrop cells;  --EGD, 10/08/16: LA grade B esophagitis in the proximal to mid esophagus without intestinal metaplasia. Her multiple appearance of stomach and duodenum. --Labs, 07/10/17: WBC 7.4, Hgb 8.2, Retic 1.3%, MCV 56.0, MCH 15.6, RDW 21.1, Plt 179; Fe 11, FeSat 3%, TIBC 383, Ferritin <4; Hgb electrophoresis -- normal profile without aberrant hemoglobin forms;  Medical History: Past Medical History:  Diagnosis Date  . Acid reflux   . Cerebral palsy (HCC)     Surgical History: Past Surgical History:  Procedure Laterality Date  . dorsal rhizotomy     back ligament looosening surgery  . ESOPHAGOGASTRODUODENOSCOPY (EGD) WITH PROPOFOL N/A 10/08/2016   Procedure: ESOPHAGOGASTRODUODENOSCOPY (EGD) WITH PROPOFOL;  Surgeon: William Outlaw, MD;  Location: [facility];  Service: Endoscopy;  Laterality: N/A;  . left arm surgery      Family History: Family History    Problem Relation Age of Onset  . Diabetes Father   . Cancer Maternal Grandmother        Breast  . Diabetes Maternal Grandfather   . Diabetes Paternal Grandfather     Social History: Social History   Social History  . Marital status: Single    Spouse name: N/A  . Number of children: N/A  . Years of  education: N/A   Occupational History  . Not on file.   Social History Main Topics  . Smoking status: Never Smoker  . Smokeless tobacco: Never Used  . Alcohol use No  . Drug use: No  . Sexual activity: No   Other Topics Concern  . Not on file   Social History Narrative  . No narrative on file    Allergies: No Known Allergies  Medications:  Current Outpatient Prescriptions  Medication Sig Dispense Refill  . naproxen sodium (ANAPROX) 220 MG tablet Take 220 mg by mouth as needed.    . pantoprazole (PROTONIX) 40 MG tablet Take 40 mg by mouth daily.    . ferrous gluconate (FERGON) 324 MG tablet Take 1 tablet (324 mg total) by mouth 3 (three) times daily with meals. 30 tablet 3   No current facility-administered medications for this visit.     Review of Systems: Review of Systems  All other systems reviewed and are negative.    PHYSICAL EXAMINATION Blood pressure 110/69, pulse 82, temperature 98.6 F (37 C), temperature source Oral, resp. rate 20, height 4' 9" (1.448 m), SpO2 100 %.  ECOG PERFORMANCE STATUS: 1 - Symptomatic but completely ambulatory  Physical Exam  Constitutional:  Wheel-chair bound, African-American female who appears to be in no acute distress. Alert, awake, Oriented 3.  HENT:  Mouth/Throat: Oropharynx is clear and moist. No oropharyngeal exudate.  Eyes: Pupils are equal, round, and reactive to light. EOM are normal. No scleral icterus.  Cardiovascular: Normal rate and regular rhythm.  Exam reveals no gallop.   No murmur heard. Pulmonary/Chest: Breath sounds normal. She has no wheezes.  Abdominal: Soft. She exhibits no distension and no mass.  No hepatosplenomegaly  Musculoskeletal: She exhibits no edema.  Lymphadenopathy:    She has no cervical adenopathy.  Skin: Skin is warm and dry. No rash noted. No erythema.     LABORATORY DATA: I have personally reviewed the data as listed: No visits with results within 1 Week(s) from this visit.   Latest known visit with results is:  Appointment on 07/10/2017  Component Date Value Ref Range Status  . WBC 07/10/2017 7.4  3.9 - 10.3 10e3/uL Final  . NEUT# 07/10/2017 4.5  1.5 - 6.5 10e3/uL Final  . HGB 07/10/2017 8.2* 11.6 - 15.9 g/dL Final  . HCT 07/10/2017 29.5* 34.8 - 46.6 % Final  . Platelets 07/10/2017 179  145 - 400 10e3/uL Final  . MCV 07/10/2017 56.0* 79.5 - 101.0 fL Final  . MCH 07/10/2017 15.6* 25.1 - 34.0 pg Final  . MCHC 07/10/2017 27.8* 31.5 - 36.0 g/dL Final  . RBC 07/10/2017 5.27  3.70 - 5.45 10e6/uL Final  . RDW 07/10/2017 21.1* 11.2 - 14.5 % Final  . lymph# 07/10/2017 2.2  0.9 - 3.3 10e3/uL Final  . MONO# 07/10/2017 0.5  0.1 - 0.9 10e3/uL Final  . Eosinophils Absolute 07/10/2017 0.1  0.0 - 0.5 10e3/uL Final  . Basophils Absolute 07/10/2017 0.0  0.0 - 0.1 10e3/uL Final  . NEUT% 07/10/2017 61.4  38.4 - 76.8 % Final  . LYMPH% 07/10/2017 29.4  14.0 - 49.7 % Final  . MONO% 07/10/2017 7.2  0.0 - 14.0 % Final  . EOS% 07/10/2017 1.6  0.0 - 7.0 % Final  . BASO% 07/10/2017 0.4  0.0 - 2.0 % Final  . nRBC 07/10/2017 0  0 - 0 % Final  . Retic % 07/10/2017 1.30  0.70 - 2.10 % Final  . Retic Ct Abs 07/10/2017 68.51  33.70 - 90.70 10e3/uL Final  . Immature Retic Fract 07/10/2017 10.40* 1.60 - 10.00 % Final  . Ferritin 07/10/2017 <4* 9 - 269 ng/ml Final  . Iron 07/10/2017 11* 41 - 142 ug/dL Final  . TIBC 07/10/2017 383  236 - 444 ug/dL Final  . UIBC 07/10/2017 371  120 - 384 ug/dL Final  . %SAT 07/10/2017 3* 21 - 57 % Final  . Hemoglobin F Quantitation 07/10/2017 0.0  0.0 - 2.0 % Final  . Hgb A 07/10/2017 98.6  96.4 - 98.8 % Final  . HGB S 07/10/2017 0.0  0.0 % Final  . HGB C 07/10/2017 0.0  0.0 % Final  . Hemoglobin A2 Quantitation 07/10/2017 1.4* 1.8 - 3.2 % Final  . HGB VARIANT 07/10/2017 0.0  0.0 % Final  . HGB INTERPRETATION 07/10/2017 Comment   Final   Comment: Normal adult hemoglobin present. Low Hgb A2 may indicate an alpha-thalassemia or iron deficiency. Suggest  clinical and hematologic correlation.   . Sodium 07/10/2017 143  136 - 145 mEq/L Final  . Potassium 07/10/2017 3.7  3.5 - 5.1 mEq/L Final  . Chloride 07/10/2017 107  98 - 109 mEq/L Final  . CO2 07/10/2017 24  22 - 29 mEq/L Final  . Glucose 07/10/2017 97  70 - 140 mg/dl Final   Glucose reference range is for nonfasting patients. Fasting glucose reference range is 70- 100.  . BUN 07/10/2017 14.4  7.0 - 26.0 mg/dL Final  . Creatinine 07/10/2017 0.6  0.6 - 1.1 mg/dL Final  . Total Bilirubin 07/10/2017 0.23  0.20 - 1.20 mg/dL Final  . Alkaline Phosphatase 07/10/2017 80  40 - 150 U/L Final  . AST 07/10/2017 13  5 - 34 U/L Final  . ALT 07/10/2017 10  0 - 55 U/L Final  . Total Protein 07/10/2017 7.5  6.4 - 8.3 g/dL Final  . Albumin 07/10/2017 3.6  3.5 - 5.0 g/dL Final  . Calcium 07/10/2017 9.3  8.4 - 10.4 mg/dL Final  . Anion Gap 07/10/2017 12* 3 - 11 mEq/L Final  . EGFR 07/10/2017 >90  >90 ml/min/1.73 m2 Final   eGFR is calculated using the CKD-EPI Creatinine Equation (2009)       Mikhail G Perlov, MD  

## 2017-07-17 NOTE — Patient Instructions (Signed)
Thank you for choosing [facility] to provide your oncology and hematology care.  To afford each patient quality time with our providers, please arrive 30 minutes before your scheduled appointment time.  If you arrive late for your appointment, you may be asked to reschedule.  We strive to give you quality time with our providers, and arriving late affects you and other patients whose appointments are after yours.   If you are a no show for multiple scheduled visits, you may be dismissed from the clinic at the providers discretion.    Again, thank you for choosing [facility], our hope is that these requests will decrease the amount of time that you wait before being seen by our physicians.  ______________________________________________________________________  Should you have questions after your visit to the [facility], please contact our office at [phone] between the hours of 8:30 and 4:30 p.m.    Voicemails left after 4:30p.m will not be returned until the following business day.    For prescription refill requests, please have your pharmacy contact us directly.  Please also try to allow 48 hours for prescription requests.    Please contact the scheduling department for questions regarding scheduling.  For scheduling of procedures such as PET scans, CT scans, MRI, Ultrasound, etc please contact central scheduling at [phone].    Resources For Cancer Patients and Caregivers:   Oncolink.org:  A wonderful resource for patients and healthcare providers for information regarding your disease, ways to tract your treatment, what to expect, etc.     American Cancer Society:  800-227-2345  Can help patients locate various types of support and financial assistance  Cancer Care: 1-800-813-HOPE (4673) Provides financial assistance, online support groups, medication/co-pay assistance.    [place] DSS:  336-641-3447 Where to apply for food  stamps, Medicaid, and utility assistance  Medicare Rights Center: 800-333-4114 Helps people with Medicare understand their rights and benefits, navigate the Medicare system, and secure the quality healthcare they deserve  SCAT: 336-333-6589 [employer] [employer]'s shared-ride transportation service for eligible riders who have a disability that prevents them from riding the fixed route bus.    For additional information on assistance programs please contact our social worker:   Grier Hock/Abigail Elmore:  [phone]            

## 2017-08-11 ENCOUNTER — Other Ambulatory Visit (HOSPITAL_BASED_OUTPATIENT_CLINIC_OR_DEPARTMENT_OTHER): Payer: Medicaid Other

## 2017-08-11 DIAGNOSIS — D500 Iron deficiency anemia secondary to blood loss (chronic): Secondary | ICD-10-CM

## 2017-08-11 LAB — CBC & DIFF AND RETIC
BASO%: 1 % (ref 0.0–2.0)
BASOS ABS: 0.1 10*3/uL (ref 0.0–0.1)
EOS ABS: 0.1 10*3/uL (ref 0.0–0.5)
EOS%: 1.3 % (ref 0.0–7.0)
HCT: 33.3 % — ABNORMAL LOW (ref 34.8–46.6)
HEMOGLOBIN: 10.3 g/dL — AB (ref 11.6–15.9)
IMMATURE RETIC FRACT: 8.5 % (ref 1.60–10.00)
LYMPH#: 2.3 10*3/uL (ref 0.9–3.3)
LYMPH%: 31.1 % (ref 14.0–49.7)
MCH: 18.4 pg — ABNORMAL LOW (ref 25.1–34.0)
MCHC: 30.9 g/dL — ABNORMAL LOW (ref 31.5–36.0)
MCV: 59.6 fL — AB (ref 79.5–101.0)
MONO#: 0.5 10*3/uL (ref 0.1–0.9)
MONO%: 6.3 % (ref 0.0–14.0)
NEUT#: 4.5 10*3/uL (ref 1.5–6.5)
NEUT%: 60.3 % (ref 38.4–76.8)
PLATELETS: 183 10*3/uL (ref 145–400)
RBC: 5.59 10*6/uL — ABNORMAL HIGH (ref 3.70–5.45)
RDW: 28.3 % — ABNORMAL HIGH (ref 11.2–14.5)
RETIC CT ABS: 46.4 10*3/uL (ref 33.70–90.70)
Retic %: 0.83 % (ref 0.70–2.10)
WBC: 7.5 10*3/uL (ref 3.9–10.3)

## 2017-08-11 LAB — COMPREHENSIVE METABOLIC PANEL
ALT: 13 U/L (ref 0–55)
AST: 17 U/L (ref 5–34)
Albumin: 3.7 g/dL (ref 3.5–5.0)
Alkaline Phosphatase: 78 U/L (ref 40–150)
Anion Gap: 9 mEq/L (ref 3–11)
BUN: 13.4 mg/dL (ref 7.0–26.0)
CALCIUM: 9.5 mg/dL (ref 8.4–10.4)
CO2: 21 meq/L — AB (ref 22–29)
CREATININE: 0.6 mg/dL (ref 0.6–1.1)
Chloride: 109 mEq/L (ref 98–109)
EGFR: >90 mL/min/{1.73_m2} (ref >90)
GLUCOSE: 115 mg/dL (ref 70–140)
Potassium: 3.6 mEq/L (ref 3.5–5.1)
Sodium: 139 mEq/L (ref 136–145)
Total Bilirubin: 0.31 mg/dL (ref 0.20–1.20)
Total Protein: 7.7 g/dL (ref 6.4–8.3)

## 2017-08-11 LAB — IRON AND TIBC
%SAT: 5 % — AB (ref 21–57)
Iron: 19 ug/dL — ABNORMAL LOW (ref 41–142)
TIBC: 405 ug/dL (ref 236–444)
UIBC: 386 ug/dL — AB (ref 120–384)

## 2017-08-11 LAB — FERRITIN: Ferritin: 5 ng/ml — ABNORMAL LOW (ref 9–269)

## 2017-08-14 ENCOUNTER — Telehealth: Payer: Self-pay | Admitting: Hematology and Oncology

## 2017-08-14 ENCOUNTER — Encounter: Payer: Self-pay | Admitting: Hematology and Oncology

## 2017-08-14 ENCOUNTER — Ambulatory Visit (HOSPITAL_BASED_OUTPATIENT_CLINIC_OR_DEPARTMENT_OTHER): Payer: Medicaid Other | Admitting: Hematology and Oncology

## 2017-08-14 ENCOUNTER — Other Ambulatory Visit: Payer: Self-pay

## 2017-08-14 VITALS — BP 109/73 | HR 67 | Temp 98.1°F | Resp 18 | Ht <= 58 in

## 2017-08-14 DIAGNOSIS — D500 Iron deficiency anemia secondary to blood loss (chronic): Secondary | ICD-10-CM

## 2017-08-14 MED ORDER — FERROUS GLUCONATE 324 (38 FE) MG PO TABS: 324.0000 mg | ORAL_TABLET | Freq: Three times a day (TID) | ORAL | 3 refills | Status: DC

## 2017-08-14 NOTE — Telephone Encounter (Signed)
Gave pt avs and calenders for upcoming appts in Sept.

## 2017-08-14 NOTE — Progress Notes (Signed)
[name] Follow-up Visit:  Assessment: Iron deficiency anemia due to chronic blood loss 31 year old female with microcytic hypochromic anemia with peripheral blood characterized by presence of elliptocytes, target cells, and occasional teardrop cells in the setting of previous history of upper GI bleeding and heavy menstrual periods. Lab work obtained at the last visit to the clinic confirms presence of severe deficiency likely causing the anemia with undetectable ferritin level indicating near complete exhaustion of the iron stores. Patient was started on oral iron supplementation, but has been experiencing significant difficulties with nausea and constipation even at one tablet of iron sulfate per day. This is unlikely to be a tolerable treatment for this particular patient.  Patient has not started taking iron. We do have some improvement in anemia and mild improvement in the iron storage at this point in time, but this changes and likely very continuous without additional iron supplementation.  Plan: --Change iron supplement to iron gluconate, patient will start with one tablet per day and increase as tolerated up to 3 tablets per day --Patient will call us in the clinic if not tolerating the supplementation by mouth. --RTC 4weeks with labs to assess illness on oral iron supplementation. If patient's iron stores and hemoglobin and not responding, we'll consider parenteral iron supplementation at that time.  Voice recognition software was used and creation of this note. Despite my best effort at editing the text, some misspelling/errors may have occurred.   Orders Placed This Encounter  Procedures  . CBC & Diff and Retic    Standing Status:   Future    Standing Expiration Date:   08/14/2018  . Comprehensive metabolic panel    Standing Status:   Future    Standing Expiration Date:   08/14/2018  . Ferritin    Standing Status:   Future    Standing Expiration Date:    08/14/2018  . Iron and TIBC    Standing Status:   Future    Standing Expiration Date:   08/14/2018    All questions were answered.  . The patient knows to call the clinic with any problems, questions or concerns.  This note was electronically signed.    History of Presenting Illness Christine Gomez is a 31 y.o. female followed in the [facility] for  diagnosis of Iron deficiency anemia. Please see details of the initial presentation and symptoms in my note from 07/10/17.  Patient returns to the clinic to review the lab work. In the interim, patient reports improving symptoms, but she did not take her iron that was prescribed at the last visit to the clinic. It appears that the prescription went to the wrong pharmacy, but patient did not inform us about that and did not seek another prescription until now.   Oncological/hematological History: --Labs, 05/23/16: WBC 9.4, Hgb 8.7, MCV 57.9, MCH 16.0, RDW 21.1, Plt 187; pBlood smear --  Positive for elliptocytes, target cells and teardrop cells;  --EGD, 10/08/16: LA grade B esophagitis in the proximal to mid esophagus without intestinal metaplasia. Her multiple appearance of stomach and duodenum. --Labs, 07/10/17: WBC 7.4, Hgb 8.2, Retic 1.3%, MCV 56.0, MCH 15.6, RDW 21.1, Plt 179; Fe 11, FeSat 3%, TIBC 383, Ferritin <4; Hgb electrophoresis -- normal profile without aberrant hemoglobin forms; --Labs, 08/12/17: WBC 7.5, Hgb 10.3, MCV 59.6, MCH 18.4, RDW 28.3, Plt 183; Fe 19, FeSat 5%, TIBC 405, Ferritin 5;   Medical History: Past Medical History:  Diagnosis Date  . Acid reflux   .  Cerebral palsy [facility])     Surgical History: Past Surgical History:  Procedure Laterality Date  . dorsal rhizotomy     back ligament looosening surgery  . ESOPHAGOGASTRODUODENOSCOPY (EGD) WITH PROPOFOL N/A 10/08/2016   Procedure: ESOPHAGOGASTRODUODENOSCOPY (EGD) WITH PROPOFOL;  Surgeon: Arta Silence, MD;  Location: [facility] END[facility]COPY;  Service: Endoscopy;   Laterality: N/A;  . left arm surgery      Family History: Family History  Problem Relation Age of Onset  . Diabetes Father   . Cancer Maternal Grandmother        Breast  . Diabetes Maternal Grandfather   . Diabetes Paternal Grandfather     Social History: Social History   Social History  . Marital status: Single    Spouse name: N/A  . Number of children: N/A  . Years of education: N/A   Occupational History  . Not on file.   Social History Main Topics  . Smoking status: Never Smoker  . Smokeless tobacco: Never Used  . Alcohol use No  . Drug use: No  . Sexual activity: No   Other Topics Concern  . Not on file   Social History Narrative  . No narrative on file    Allergies: No Known Allergies  Medications:  Current Outpatient Prescriptions  Medication Sig Dispense Refill  . ferrous gluconate (FERGON) 324 MG tablet Take 1 tablet (324 mg total) by mouth 3 (three) times daily with meals. 30 tablet 3  . naproxen sodium (ANAPROX) 220 MG tablet Take 220 mg by mouth as needed.    . pantoprazole (PROTONIX) 40 MG tablet Take 40 mg by mouth daily.     No current facility-administered medications for this visit.     Review of Systems: Review of Systems  All other systems reviewed and are negative.    PHYSICAL EXAMINATION Blood pressure 109/73, pulse 67, temperature 98.1 F (36.7 C), temperature source Oral, resp. rate 18, height 4' 9"  (1.448 m), SpO2 100 %.  ECOG PERFORMANCE STATUS: 1 - Symptomatic but completely ambulatory  Physical Exam  Constitutional:  Wheel-chair bound, African-American female who appears to be in no acute distress. Alert, awake, Oriented 3.  HENT:  Mouth/Throat: Oropharynx is clear and moist. No oropharyngeal exudate.  Eyes: Pupils are equal, round, and reactive to light. EOM are normal. No scleral icterus.  Cardiovascular: Normal rate and regular rhythm.  Exam reveals no gallop.   No murmur heard. Pulmonary/Chest: Breath sounds  normal. She has no wheezes.  Abdominal: Soft. She exhibits no distension and no mass.  No hepatosplenomegaly  Musculoskeletal: She exhibits no edema.  Lymphadenopathy:    She has no cervical adenopathy.  Skin: Skin is warm and dry. No rash noted. No erythema.     LABORATORY DATA: I have personally reviewed the data as listed: Appointment on 08/11/2017  Component Date Value Ref Range Status  . WBC 08/11/2017 7.5  3.9 - 10.3 10e3/uL Final  . NEUT# 08/11/2017 4.5  1.5 - 6.5 10e3/uL Final  . HGB 08/11/2017 10.3* 11.6 - 15.9 g/dL Final  . HCT 08/11/2017 33.3* 34.8 - 46.6 % Final  . Platelets 08/11/2017 183  145 - 400 10e3/uL Final  . MCV 08/11/2017 59.6* 79.5 - 101.0 fL Final  . MCH 08/11/2017 18.4* 25.1 - 34.0 pg Final  . MCHC 08/11/2017 30.9* 31.5 - 36.0 g/dL Final  . RBC 08/11/2017 5.59* 3.70 - 5.45 10e6/uL Final  . RDW 08/11/2017 28.3* 11.2 - 14.5 % Final  . lymph# 08/11/2017 2.3  0.9 - 3.3  10e3/uL Final  . MONO# 08/11/2017 0.5  0.1 - 0.9 10e3/uL Final  . Eosinophils Absolute 08/11/2017 0.1  0.0 - 0.5 10e3/uL Final  . Basophils Absolute 08/11/2017 0.1  0.0 - 0.1 10e3/uL Final  . NEUT% 08/11/2017 60.3  38.4 - 76.8 % Final  . LYMPH% 08/11/2017 31.1  14.0 - 49.7 % Final  . MONO% 08/11/2017 6.3  0.0 - 14.0 % Final  . EOS% 08/11/2017 1.3  0.0 - 7.0 % Final  . BASO% 08/11/2017 1.0  0.0 - 2.0 % Final  . Retic % 08/11/2017 0.83  0.70 - 2.10 % Final  . Retic Ct Abs 08/11/2017 46.40  33.70 - 90.70 10e3/uL Final  . Immature Retic Fract 08/11/2017 8.50  1.60 - 10.00 % Final  . Ferritin 08/11/2017 5* 9 - 269 ng/ml Final  . Iron 08/11/2017 19* 41 - 142 ug/dL Final  . TIBC 08/11/2017 405  236 - 444 ug/dL Final  . UIBC 08/11/2017 386* 120 - 384 ug/dL Final  . %SAT 08/11/2017 5* 21 - 57 % Final  . Sodium 08/11/2017 139  136 - 145 mEq/L Final  . Potassium 08/11/2017 3.6  3.5 - 5.1 mEq/L Final  . Chloride 08/11/2017 109  98 - 109 mEq/L Final  . CO2 08/11/2017 21* 22 - 29 mEq/L Final  .  Glucose 08/11/2017 115  70 - 140 mg/dl Final   Glucose reference range is for nonfasting patients. Fasting glucose reference range is 70- 100.  Marland Kitchen BUN 08/11/2017 13.4  7.0 - 26.0 mg/dL Final  . Creatinine 08/11/2017 0.6  0.6 - 1.1 mg/dL Final  . Total Bilirubin 08/11/2017 0.31  0.20 - 1.20 mg/dL Final  . Alkaline Phosphatase 08/11/2017 78  40 - 150 U/L Final  . AST 08/11/2017 17  5 - 34 U/L Final  . ALT 08/11/2017 13  0 - 55 U/L Final  . Total Protein 08/11/2017 7.7  6.4 - 8.3 g/dL Final  . Albumin 08/11/2017 3.7  3.5 - 5.0 g/dL Final  . Calcium 08/11/2017 9.5  8.4 - 10.4 mg/dL Final  . Anion Gap 08/11/2017 9  3 - 11 mEq/L Final  . EGFR 08/11/2017 >90  >90 ml/min/1.73 m2 Final   eGFR is calculated using the CKD-EPI Creatinine Equation (2009)       Ardath Sax, MD

## 2017-08-14 NOTE — Assessment & Plan Note (Signed)
31 year old female with microcytic hypochromic anemia with peripheral blood characterized by presence of elliptocytes, target cells, and occasional teardrop cells in the setting of previous history of upper GI bleeding and heavy menstrual periods. Lab work obtained at the last visit to the clinic confirms presence of severe deficiency likely causing the anemia with undetectable ferritin level indicating near complete exhaustion of the iron stores. Patient was started on oral iron supplementation, but has been experiencing significant difficulties with nausea and constipation even at one tablet of iron sulfate per day. This is unlikely to be a tolerable treatment for this particular patient.  Patient has not started taking iron. We do have some improvement in anemia and mild improvement in the iron storage at this point in time, but this changes and likely very continuous without additional iron supplementation.  Plan: --Change iron supplement to iron gluconate, patient will start with one tablet per day and increase as tolerated up to 3 tablets per day --Patient will call us in the clinic if not tolerating the supplementation by mouth. --RTC 4weeks with labs to assess illness on oral iron supplementation. If patient's iron stores and hemoglobin and not responding, we'll consider parenteral iron supplementation at that time.  Voice recognition software was used and creation of this note. Despite my best effort at editing the text, some misspelling/errors may have occurred.

## 2017-09-09 ENCOUNTER — Other Ambulatory Visit: Payer: Medicaid Other

## 2017-09-11 ENCOUNTER — Ambulatory Visit: Payer: Medicaid Other | Admitting: Hematology and Oncology

## 2017-09-14 ENCOUNTER — Other Ambulatory Visit: Payer: Medicaid Other

## 2017-09-16 ENCOUNTER — Ambulatory Visit: Payer: Medicaid Other | Admitting: Hematology and Oncology

## 2017-09-28 ENCOUNTER — Other Ambulatory Visit: Payer: Self-pay | Admitting: Gastroenterology

## 2017-09-28 DIAGNOSIS — R1013 Epigastric pain: Secondary | ICD-10-CM

## 2017-09-28 DIAGNOSIS — K449 Diaphragmatic hernia without obstruction or gangrene: Secondary | ICD-10-CM

## 2017-10-07 ENCOUNTER — Other Ambulatory Visit: Payer: Medicaid Other

## 2017-10-07 ENCOUNTER — Inpatient Hospital Stay
Admission: RE | Admit: 2017-10-07 | Discharge: 2017-10-07 | Disposition: A | Discharge status: Home / Self Care | Payer: Medicaid Other | Source: Ambulatory Visit | Attending: Gastroenterology | Admitting: Gastroenterology

## 2017-10-19 ENCOUNTER — Other Ambulatory Visit: Payer: Self-pay | Admitting: Gastroenterology

## 2017-10-19 ENCOUNTER — Ambulatory Visit
Admission: RE | Admit: 2017-10-19 | Discharge: 2017-10-19 | Disposition: A | Discharge status: Home / Self Care | Payer: Medicaid Other | Source: Ambulatory Visit | Attending: Gastroenterology | Admitting: Gastroenterology

## 2017-10-19 DIAGNOSIS — K449 Diaphragmatic hernia without obstruction or gangrene: Secondary | ICD-10-CM

## 2017-10-19 DIAGNOSIS — R1013 Epigastric pain: Secondary | ICD-10-CM

## 2017-10-20 ENCOUNTER — Telehealth: Payer: Self-pay

## 2017-10-20 NOTE — Telephone Encounter (Signed)
Dr. Gweneth DimitriPerlov would like to f/u with pt in the next week or two with labs and doctor visit. Pt in agreement with plan to f/u on change in iron dosage and how she is responding. Scheduling message sent. Lab work CBC, CMET, Iron, and Ferritin.

## 2017-10-21 ENCOUNTER — Other Ambulatory Visit: Payer: Self-pay

## 2017-10-21 ENCOUNTER — Telehealth: Payer: Self-pay | Admitting: Hematology and Oncology

## 2017-10-21 NOTE — Telephone Encounter (Signed)
Scheduled appt per 10/23 sch message - left message with appt date and time and sent reminder letter in the mail.

## 2017-10-28 ENCOUNTER — Ambulatory Visit: Payer: Medicaid Other | Admitting: Hematology and Oncology

## 2017-10-28 ENCOUNTER — Other Ambulatory Visit: Payer: Medicaid Other

## 2018-12-09 IMAGING — RF DG UGI W/ KUB
3 series · 15 of 19 positions shown · non-contrast
Comparison: None.

CLINICAL DATA: Persistent right upper quadrant abdominal pain.

EXAM:
UPPER GI SERIES WITH KUB
TECHNIQUE: After obtaining a scout radiograph a routine upper GI series was
performed using thin density barium.
FLUOROSCOPY TIME:  Fluoroscopy Time:  2 minutes and 0 seconds
Radiation Exposure Index (if provided by the fluoroscopic device):
212 mGy
Number of Acquired Spot Images: 0

[Series 1: one shot · 1 of 1 slices shown (1 of 2)]
[im 1/1]
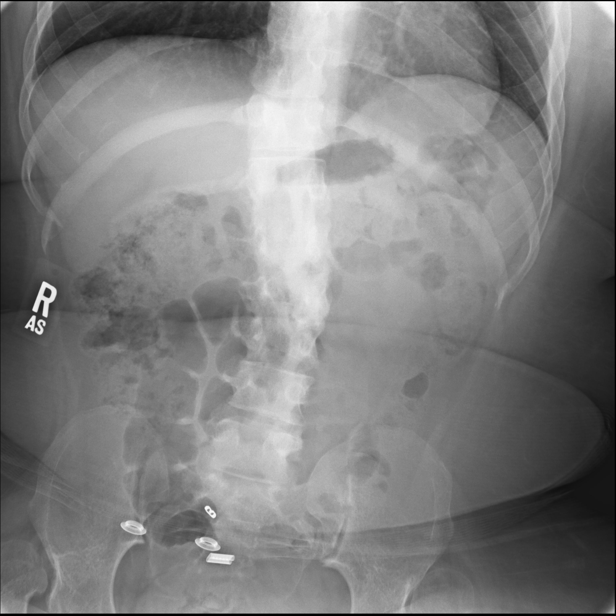

[Series 2: sequence · 3 of 33 frames shown]
[frame 5/33]
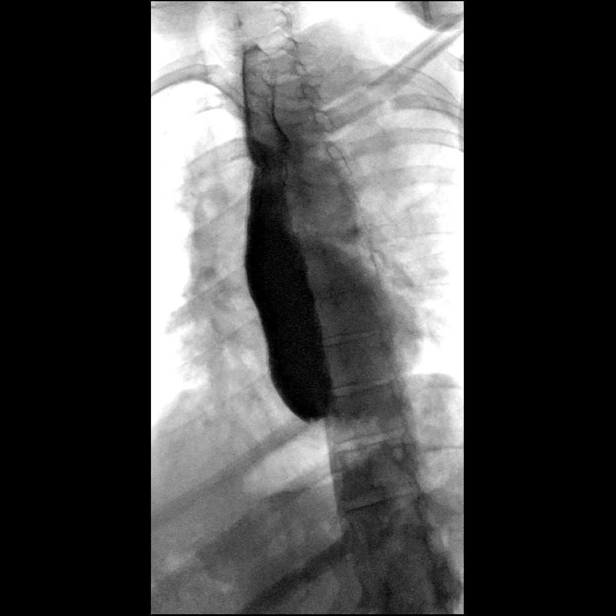
[frame 17/33]
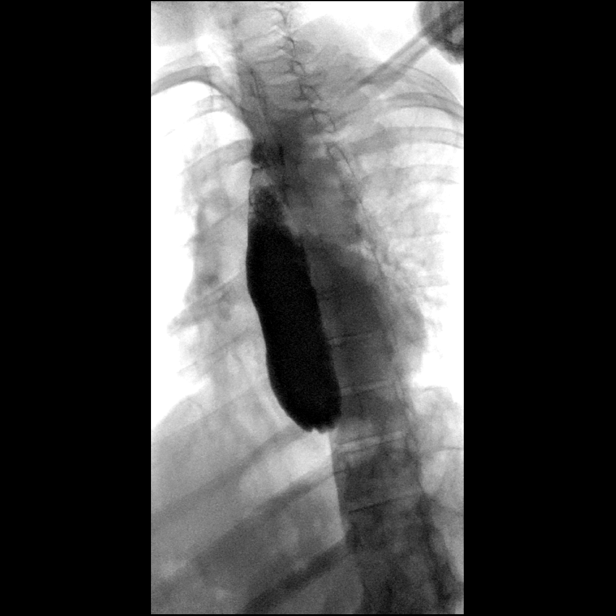
[frame 29/33]
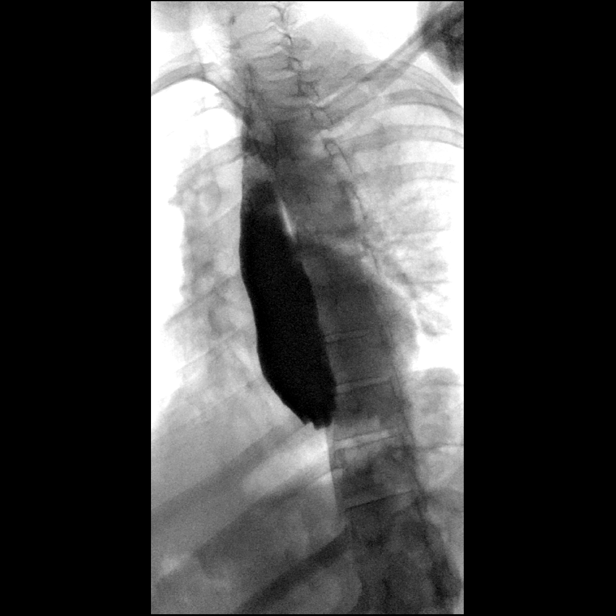

[Series 3: one shot · 11 of 14 slices shown (2 of 2)]
[im 1/14]
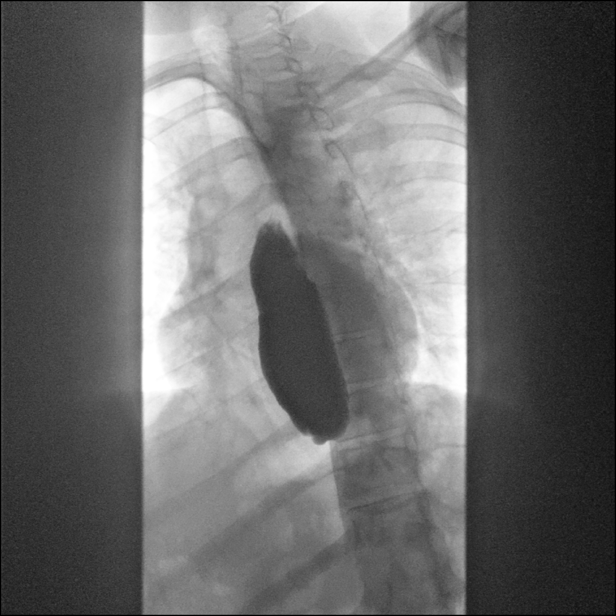
[im 2/14]
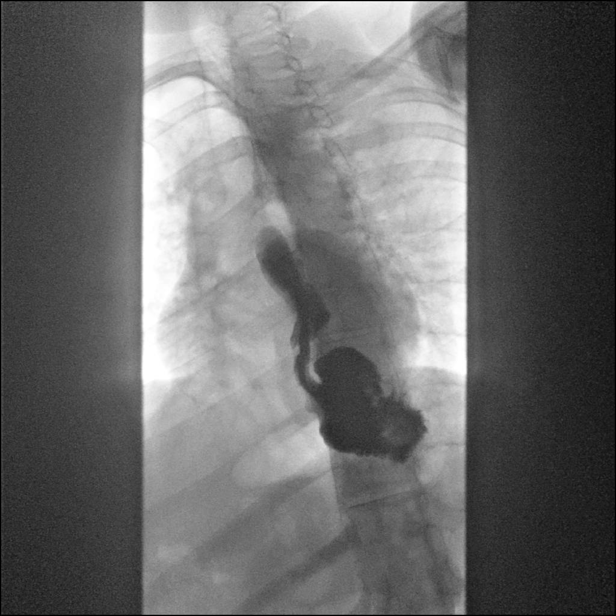
[im 4/14]
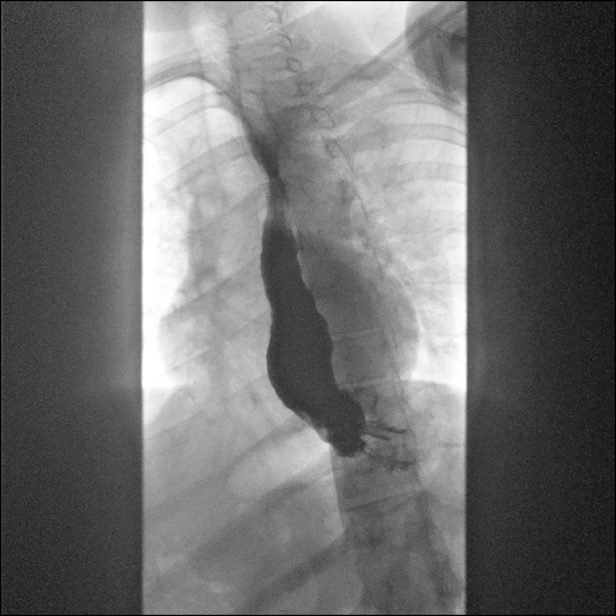
[im 5/14]
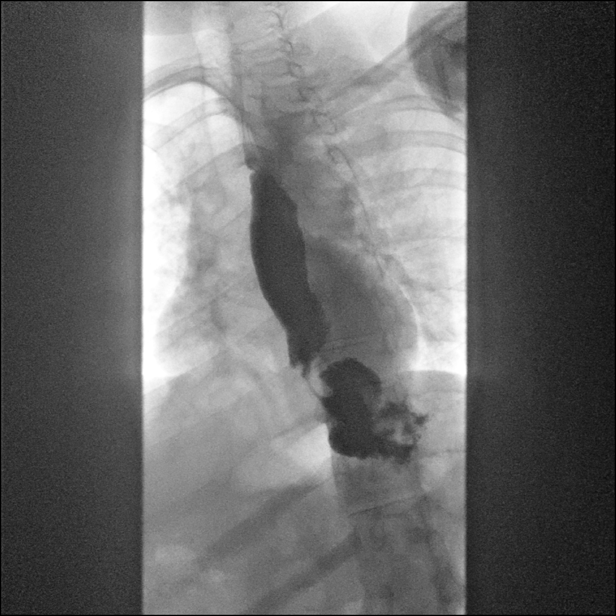
[im 6/14]
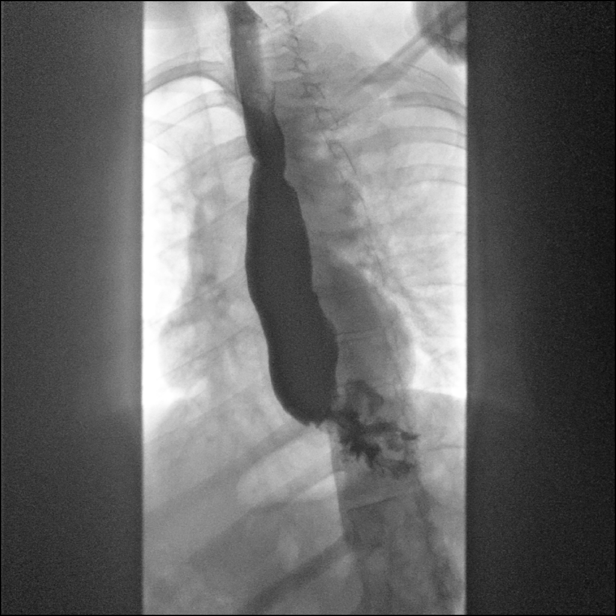
[im 8/14]
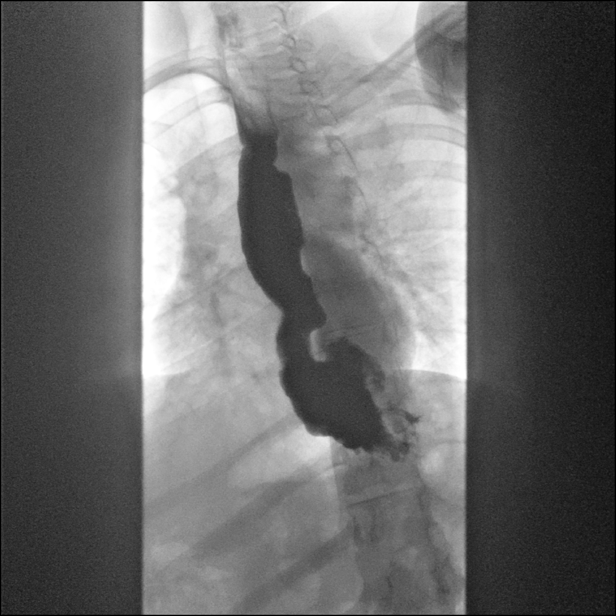
[im 9/14]
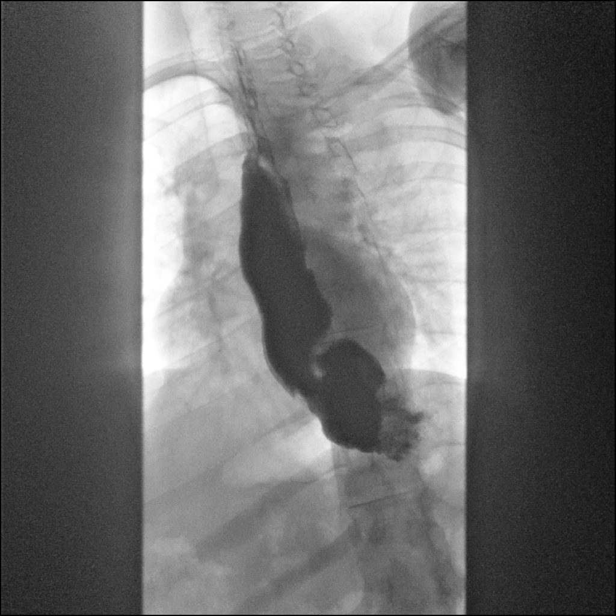
[im 10/14]
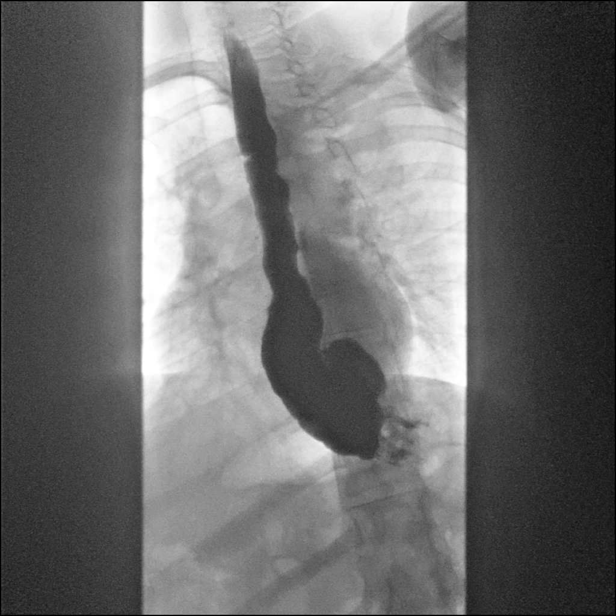
[im 11/14]
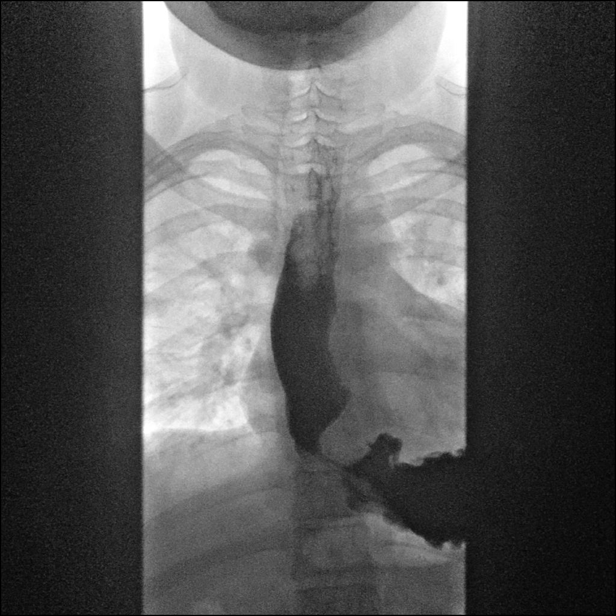
[im 13/14]
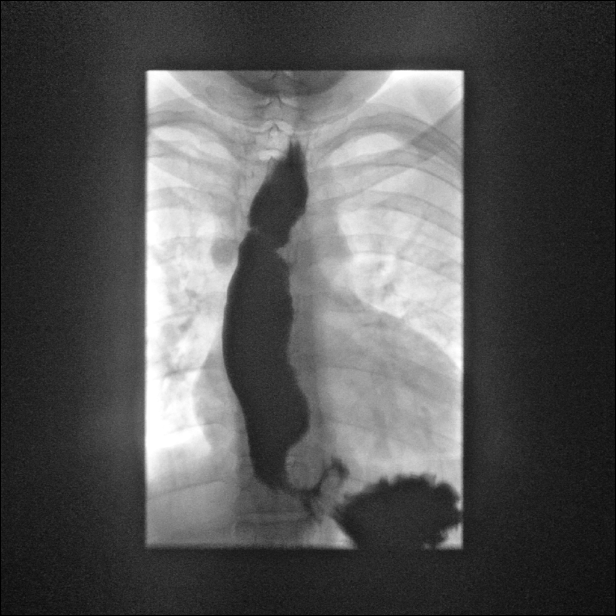
[im 14/14]
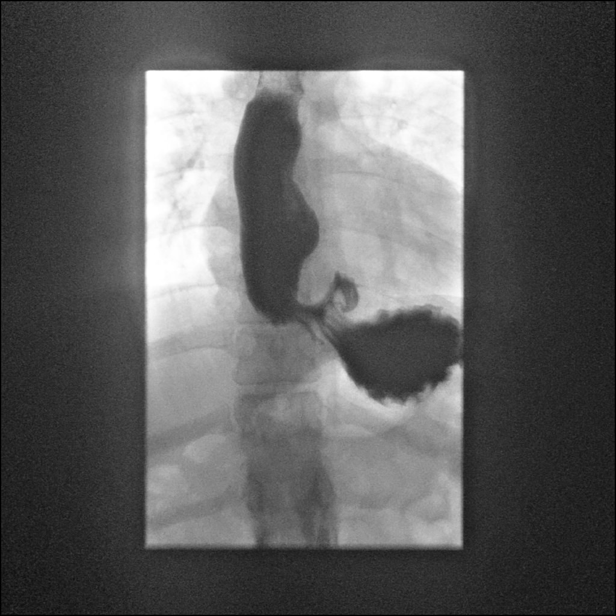

[15 of 19 positions shown; findings below may reference images not displayed]

FINDINGS: Esophageal dysmotility with disruption of the primary peristaltic
waves and occasional tertiary contractions. There is a
moderate-sized hiatal hernia with a paraesophageal component and
gross frequent GE reflux.

The visualized stomach was grossly normal but very little barium
would enter the stomach.
IMPRESSION: Esophageal dysmotility.

Moderate-sized hiatal hernia with a paraesophageal component and
gross and frequent GE reflux.

Significant esophageal stasis.

## 2019-02-01 IMAGING — US US ABDOMEN COMPLETE
1 series · 14 of 25 positions shown · non-contrast
Comparison: None.

CLINICAL DATA: Epigastric, right upper quadrant pain

EXAM:
ABDOMEN ULTRASOUND COMPLETE

[Series 1: us abdomen complete · 0.23mm/px · 14 of 70 slices shown]
[im 1/70]
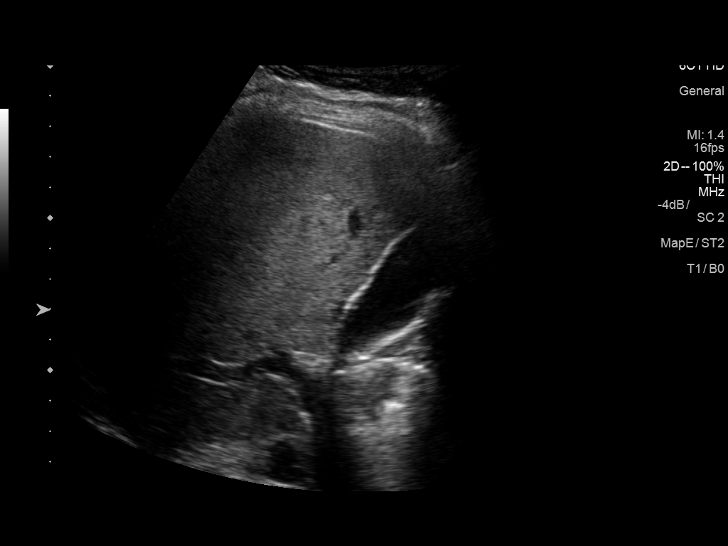
[im 6/70]
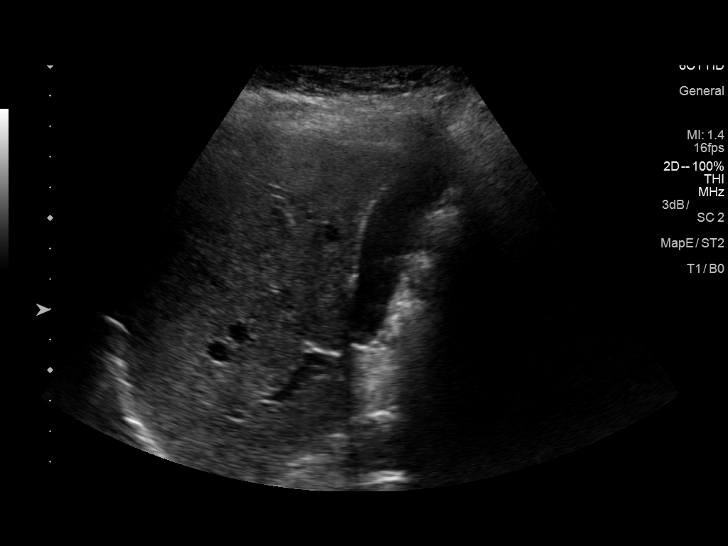
[im 12/70]
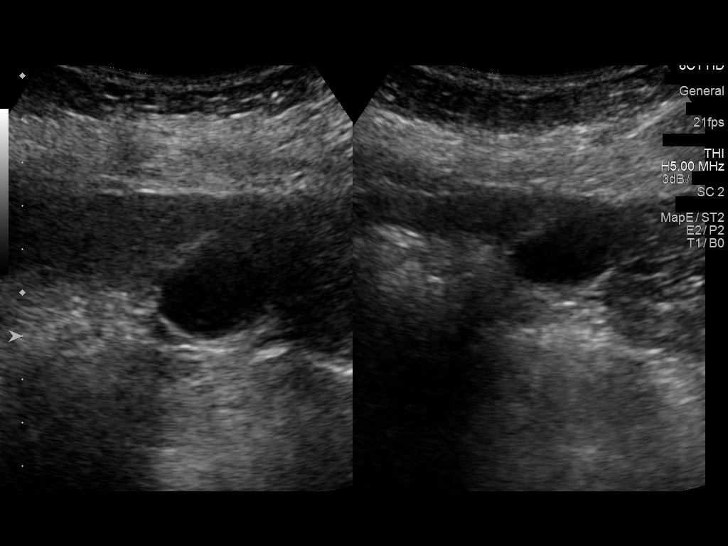
[im 18/70]
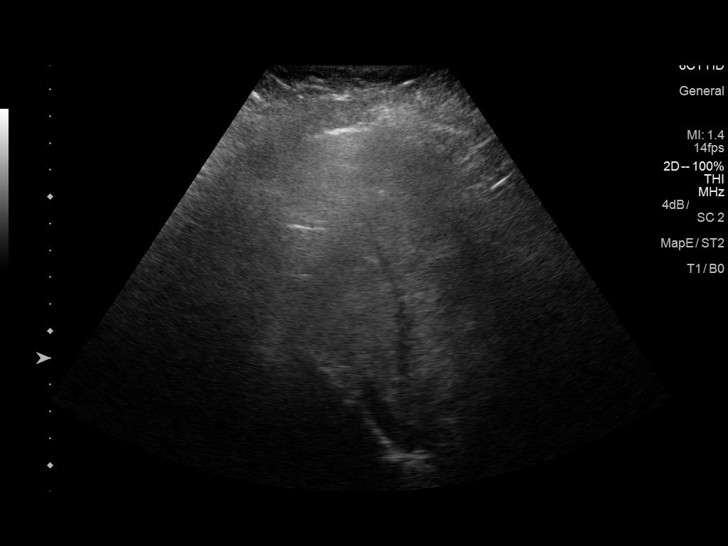
[im 24/70]
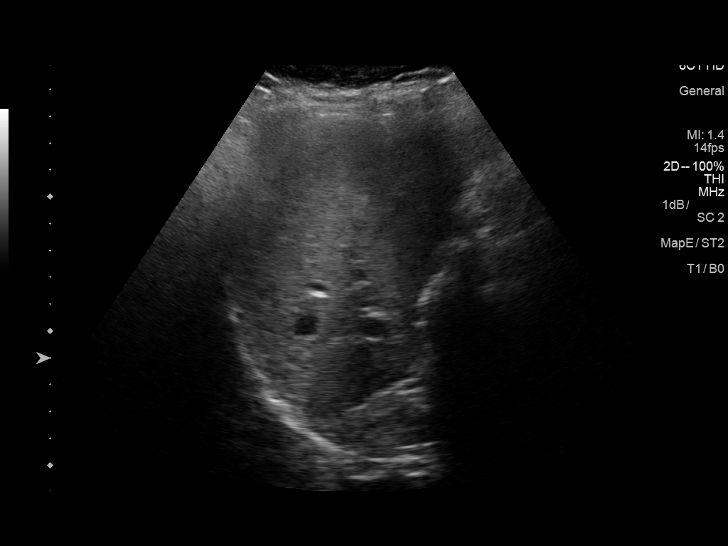
[im 26/70]
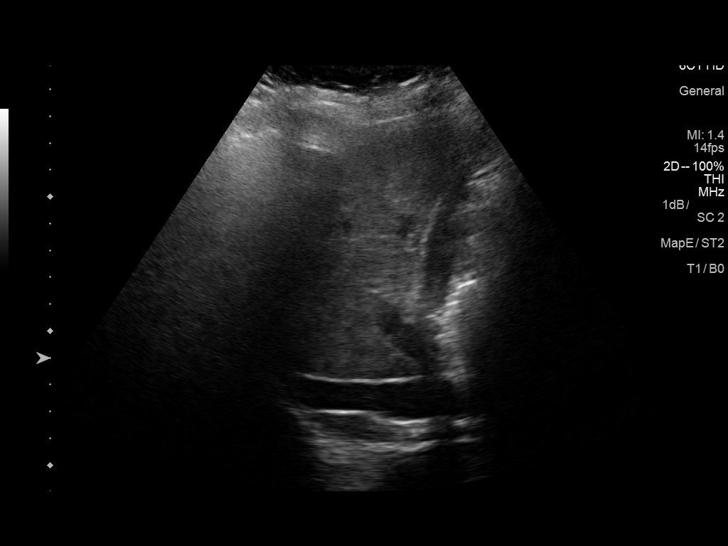
[im 32/70]
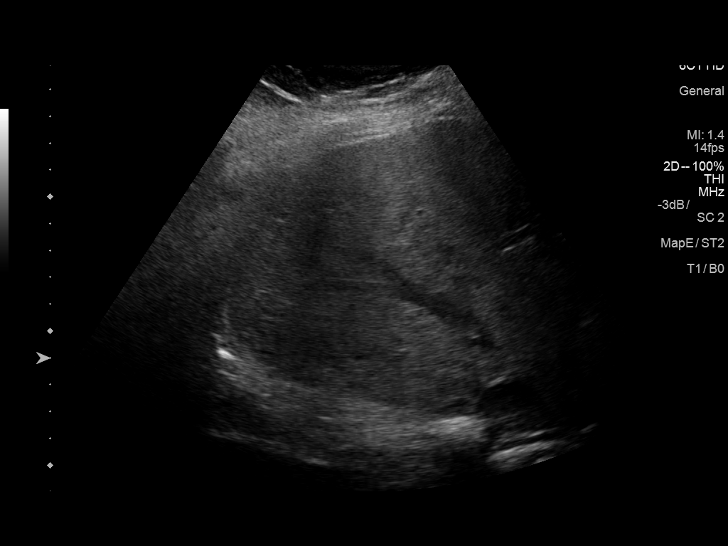
[im 38/70]
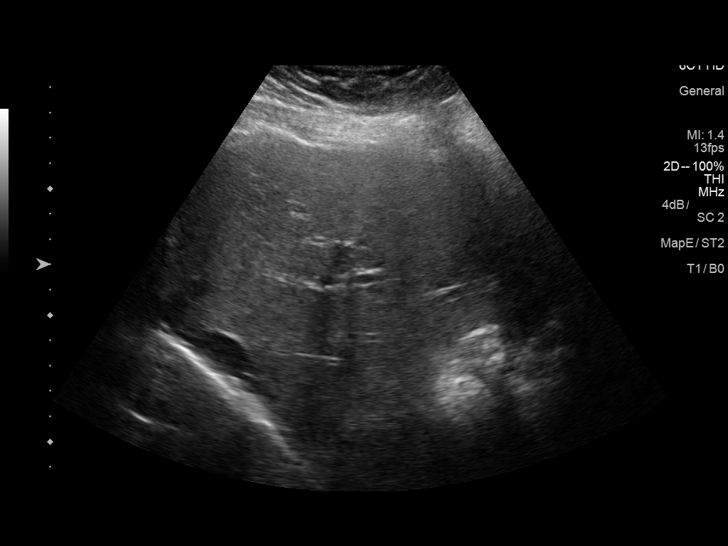
[im 44/70]
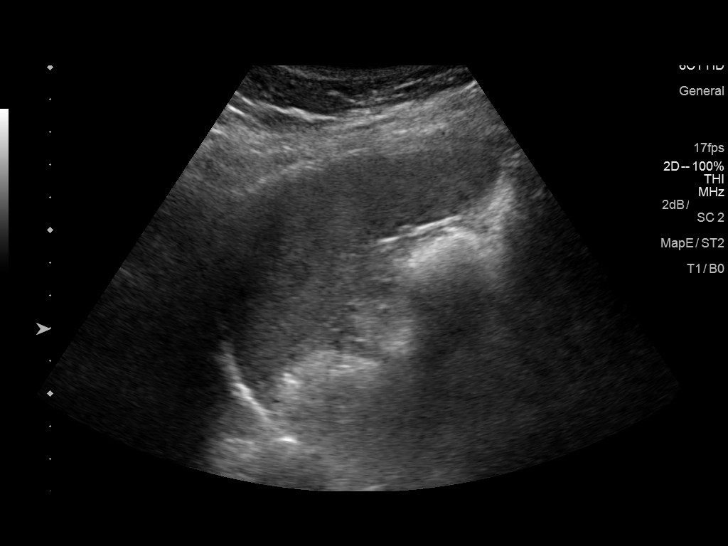
[im 47/70]
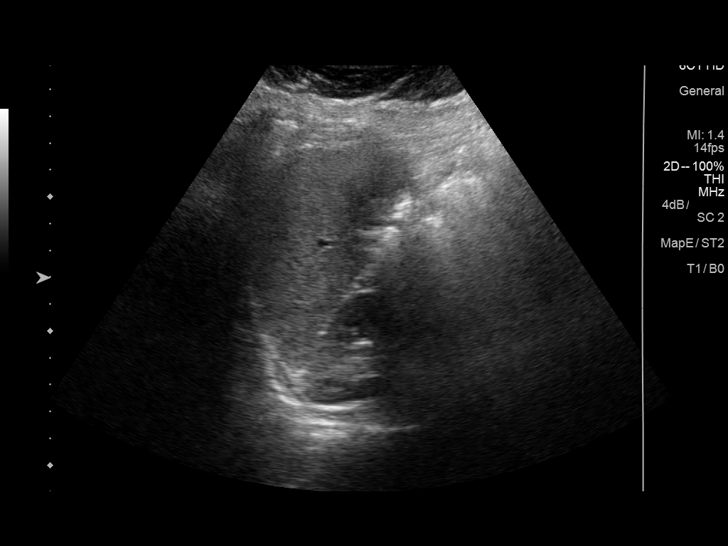
[im 52/70]
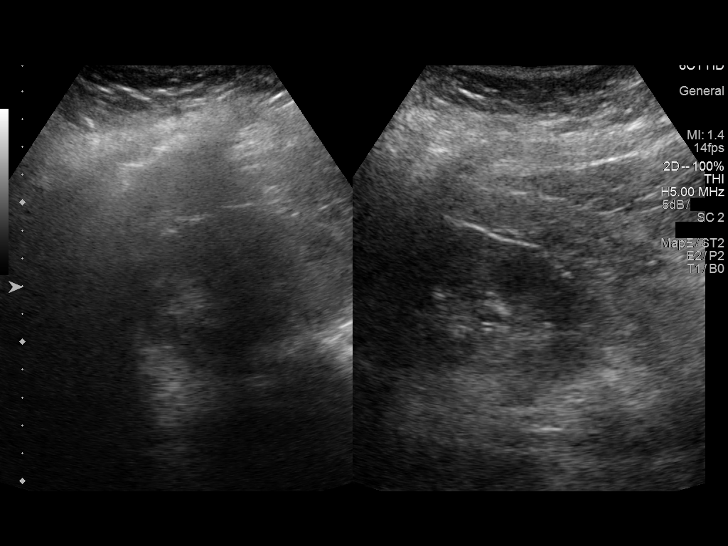
[im 58/70]
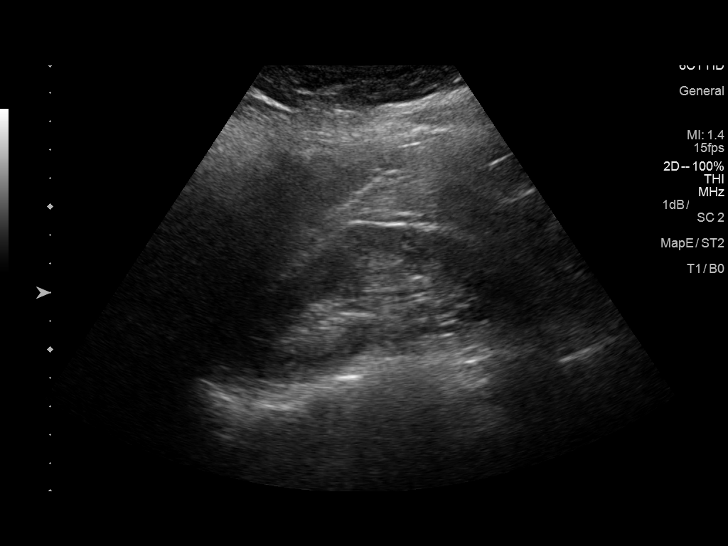
[im 64/70]
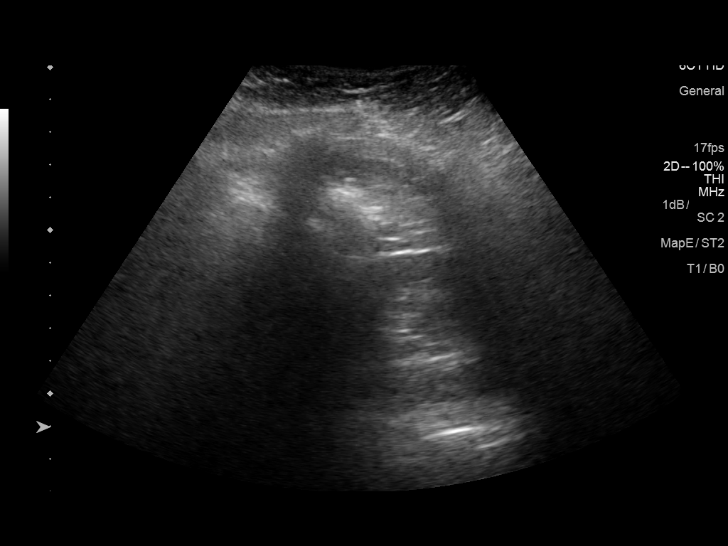
[im 70/70]
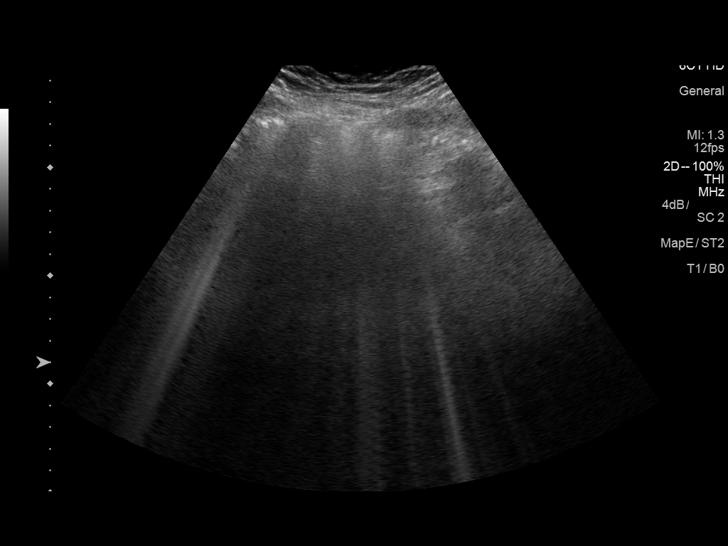

[14 of 25 positions shown; findings below may reference images not displayed]

FINDINGS: Gallbladder: No gallstones or wall thickening visualized. No
sonographic Murphy sign noted by sonographer.

Common bile duct: Diameter: Normal caliber, 3 mm

Liver: No focal lesion identified. Within normal limits in
parenchymal echogenicity. Portal vein is patent on color Doppler
imaging with normal direction of blood flow towards the liver.

IVC: Not visualized due to overlying bowel gas.

Pancreas: Not visualized due to overlying bowel gas.

Spleen: Size and appearance within normal limits.

Right Kidney: Length: 10.2 cm. Echogenicity within normal limits. No
mass or hydronephrosis visualized.

Left Kidney: Length: 10.6 cm. Echogenicity within normal limits. No
mass or hydronephrosis visualized.

Abdominal aorta: Obscured by overlying bowel gas.

Other findings: None.
IMPRESSION: No acute findings.

Midline structures obscured by overlying bowel gas.

## 2019-03-10 ENCOUNTER — Emergency Department (HOSPITAL_COMMUNITY)
Admission: EM | Admit: 2019-03-10 | Discharge: 2019-03-10 | Disposition: A | Discharge status: Home / Self Care | Payer: Medicaid Other | Attending: Emergency Medicine | Admitting: Emergency Medicine

## 2019-03-10 ENCOUNTER — Other Ambulatory Visit: Payer: Self-pay

## 2019-03-10 ENCOUNTER — Encounter (HOSPITAL_COMMUNITY): Payer: Self-pay | Admitting: Emergency Medicine

## 2019-03-10 DIAGNOSIS — R509 Fever, unspecified: Secondary | ICD-10-CM | POA: Diagnosis present

## 2019-03-10 DIAGNOSIS — J101 Influenza due to other identified influenza virus with other respiratory manifestations: Secondary | ICD-10-CM | POA: Diagnosis not present

## 2019-03-10 DIAGNOSIS — Z79899 Other long term (current) drug therapy: Secondary | ICD-10-CM | POA: Diagnosis not present

## 2019-03-10 LAB — COMPREHENSIVE METABOLIC PANEL
ALT: 18 U/L (ref 0–44)
ANION GAP: 9 (ref 5–15)
AST: 23 U/L (ref 15–41)
Albumin: 3.8 g/dL (ref 3.5–5.0)
Alkaline Phosphatase: 75 U/L (ref 38–126)
BUN: 10 mg/dL (ref 6–20)
CALCIUM: 8.3 mg/dL — AB (ref 8.9–10.3)
CHLORIDE: 101 mmol/L (ref 98–111)
CO2: 24 mmol/L (ref 22–32)
CREATININE: 0.42 mg/dL — AB (ref 0.44–1.00)
GFR calc Af Amer: >60 mL/min (ref >60)
GFR calc non Af Amer: >60 mL/min (ref >60)
Glucose, Bld: 84 mg/dL (ref 70–99)
POTASSIUM: 3.7 mmol/L (ref 3.5–5.1)
SODIUM: 134 mmol/L — AB (ref 135–145)
Total Bilirubin: 0.3 mg/dL (ref 0.3–1.2)
Total Protein: 7.3 g/dL (ref 6.5–8.1)

## 2019-03-10 LAB — CBC WITH DIFFERENTIAL/PLATELET
ABS IMMATURE GRANULOCYTES: 0.02 10*3/uL (ref 0.00–0.07)
BASOS PCT: 0 %
Basophils Absolute: 0 10*3/uL (ref 0.0–0.1)
Eosinophils Absolute: 0.1 10*3/uL (ref 0.0–0.5)
Eosinophils Relative: 2 %
HCT: 34.5 % — ABNORMAL LOW (ref 36.0–46.0)
Hemoglobin: 9.3 g/dL — ABNORMAL LOW (ref 12.0–15.0)
Immature Granulocytes: 0 %
LYMPHS ABS: 1.1 10*3/uL (ref 0.7–4.0)
LYMPHS PCT: 21 %
MCH: 17.4 pg — AB (ref 26.0–34.0)
MCHC: 27 g/dL — AB (ref 30.0–36.0)
MCV: 64.6 fL — ABNORMAL LOW (ref 80.0–100.0)
Monocytes Absolute: 0.5 10*3/uL (ref 0.1–1.0)
Monocytes Relative: 10 %
NEUTROS PCT: 67 %
NRBC: 0 % (ref 0.0–0.2)
Neutro Abs: 3.5 10*3/uL (ref 1.7–7.7)
PLATELETS: 169 10*3/uL (ref 150–400)
RBC: 5.34 MIL/uL — AB (ref 3.87–5.11)
RDW: 20.7 % — AB (ref 11.5–15.5)
WBC: 5.3 10*3/uL (ref 4.0–10.5)

## 2019-03-10 LAB — INFLUENZA PANEL BY PCR (TYPE A & B)
INFLAPCR: POSITIVE — AB
Influenza B By PCR: NEGATIVE

## 2019-03-10 LAB — LIPASE, BLOOD: Lipase: 41 U/L (ref 11–51)

## 2019-03-10 MED ORDER — SODIUM CHLORIDE 0.9 % IV BOLUS
1000.0000 mL | Freq: Once | INTRAVENOUS | Status: AC
  Administered 2019-03-10: 1000 mL via INTRAVENOUS

## 2019-03-10 MED ORDER — ACETAMINOPHEN 500 MG PO TABS
1000.0000 mg | ORAL_TABLET | Freq: Once | ORAL | Status: DC
  Filled 2019-03-10: qty 2

## 2019-03-10 MED ORDER — ONDANSETRON 4 MG PO TBDP: ORAL_TABLET | ORAL | 0 refills | Status: DC

## 2019-03-10 MED ORDER — ONDANSETRON HCL 4 MG/2ML IJ SOLN
4.0000 mg | Freq: Once | INTRAMUSCULAR | Status: AC
  Administered 2019-03-10: 4 mg via INTRAVENOUS
  Filled 2019-03-10: qty 2

## 2019-03-10 MED ORDER — ACETAMINOPHEN 160 MG/5ML PO SOLN
1000.0000 mg | Freq: Once | ORAL | Status: AC
  Administered 2019-03-10: 1000 mg via ORAL
  Filled 2019-03-10: qty 40.6

## 2019-03-10 NOTE — ED Notes (Signed)
Patient requested tylenol be changed from pill to liquid form.

## 2019-03-10 NOTE — Discharge Instructions (Signed)
Take tylenol 2 pills 4 times a day and motrin 4 pills 3 times a day.  Drink plenty of fluids.  Return for worsening shortness of breath, headache, confusion. Follow up with your family doctor.   

## 2019-03-10 NOTE — ED Provider Notes (Signed)
[facility]-EMERGENCY DEPT Provider Note   CSN: [id number] Arrival date & time: 03/10/19  1922    History   Chief Complaint Chief Complaint  Patient presents with  . Nausea  . Fever  . Cough    HPI Christine Gomez is a 33 y.o. female.     33 yo F with a chief complaint of fever and chills.  This been going on for at least 2 days and maybe 3.  No known sick contacts.  Cough congestion fevers chills myalgias nausea vomiting.  She has been taking naproxen with minimal improvement.  She went to urgent care and was flu negative so they prescribed her amoxicillin.  She denies any specific region that they mention treating.  She denies ear pain or sore throat denies sinus pain.  The history is provided by the patient.  Fever  Associated symptoms: chills, congestion, cough, myalgias, nausea and vomiting   Associated symptoms: no chest pain, no dysuria, no headaches and no rhinorrhea   Cough  Associated symptoms: chills, fever and myalgias   Associated symptoms: no chest pain, no headaches, no rhinorrhea, no shortness of breath and no wheezing   Illness  Severity:  Moderate Onset quality:  Sudden Duration:  2 days Timing:  Constant Progression:  Worsening Chronicity:  New Associated symptoms: congestion, cough, fever, myalgias, nausea and vomiting   Associated symptoms: no abdominal pain, no chest pain, no headaches, no rhinorrhea, no shortness of breath and no wheezing     Past Medical History:  Diagnosis Date  . Acid reflux   . Cerebral palsy [facility])     Patient Active Problem List   Diagnosis Date Noted  . Iron deficiency anemia due to chronic blood loss 07/10/2017    Past Surgical History:  Procedure Laterality Date  . dorsal rhizotomy     back ligament looosening surgery  . ESOPHAGOGASTRODUODENOSCOPY (EGD) WITH PROPOFOL N/A 10/08/2016   Procedure: ESOPHAGOGASTRODUODENOSCOPY (EGD) WITH PROPOFOL;  Surgeon: Willis Modena, MD;  Location: [facility]  ENDOSCOPY;  Service: Endoscopy;  Laterality: N/A;  . left arm surgery       OB History   No obstetric history on file.      Home Medications    Prior to Admission medications   Medication Sig Start Date End Date Taking? Authorizing Provider  amoxicillin (AMOXIL) 875 MG tablet Take 875 mg by mouth 2 (two) times daily.   Yes [provider]  loratadine (CLARITIN) 10 MG tablet Take 10 mg by mouth daily as needed for allergies.   Yes [provider]  naproxen sodium (ALEVE) 220 MG tablet Take 440 mg by mouth daily as needed (fever).   Yes [provider]  ferrous gluconate (FERGON) 324 MG tablet Take 1 tablet (324 mg total) by mouth 3 (three) times daily with meals. Patient not taking: Reported on 03/10/2019 08/14/17   Daisy Blossom, MD  ondansetron (ZOFRAN ODT) 4 MG disintegrating tablet 4mg  ODT q4 hours prn nausea/vomit 03/10/19   Melene Plan, DO    Family History Family History  Problem Relation Age of Onset  . Diabetes Father   . Cancer Maternal Grandmother        Breast  . Diabetes Maternal Grandfather   . Diabetes Paternal Grandfather     Social History Social History   Tobacco Use  . Smoking status: Never Smoker  . Smokeless tobacco: Never Used  Substance Use Topics  . Alcohol use: No  . Drug use: No  Allergies   Patient has no known allergies.   Review of Systems Review of Systems  Constitutional: Positive for chills and fever.  HENT: Positive for congestion. Negative for rhinorrhea.   Eyes: Negative for redness and visual disturbance.  Respiratory: Positive for cough. Negative for shortness of breath and wheezing.   Cardiovascular: Negative for chest pain and palpitations.  Gastrointestinal: Positive for nausea and vomiting. Negative for abdominal pain.  Genitourinary: Negative for dysuria and urgency.  Musculoskeletal: Positive for arthralgias and myalgias.  Skin: Negative for pallor and wound.  Neurological: Negative for  dizziness and headaches.     Physical Exam Updated Vital Signs BP 121/82 (BP Location: Left Arm)   Pulse (!) 112   Temp 98.4 F (36.9 C)   Resp 19   Ht  (1.473 m)   Wt 83.9 kg   LMP 02/21/2019   SpO2 95%   BMI 38.67 kg/m   Physical Exam Vitals signs and nursing note reviewed.  Constitutional:      General: She is not in acute distress.    Appearance: She is well-developed. She is not diaphoretic.  HENT:     Head: Normocephalic and atraumatic.     Comments: Swollen turbinates, posterior nasal drip, no noted sinus ttp, tm normal bilaterally.   Eyes:     Pupils: Pupils are equal, round, and reactive to light.  Neck:     Musculoskeletal: Normal range of motion and neck supple.  Cardiovascular:     Rate and Rhythm: Normal rate and regular rhythm.     Heart sounds: No murmur. No friction rub. No gallop.   Pulmonary:     Effort: Pulmonary effort is normal.     Breath sounds: No wheezing or rales.  Abdominal:     General: There is no distension.     Palpations: Abdomen is soft.     Tenderness: There is no abdominal tenderness.     Comments: Benign abdominal exam  Musculoskeletal:        General: No tenderness.     Comments: Contractured bilateral lower extremities.  Contracture left upper extremity.  Skin:    General: Skin is warm and dry.  Neurological:     Mental Status: She is alert and oriented to person, place, and time.  Psychiatric:        Behavior: Behavior normal.      ED Treatments / Results  Labs (all labs ordered are listed, but only abnormal results are displayed) Labs Reviewed  CBC WITH DIFFERENTIAL/PLATELET - Abnormal; Notable for the following components:      Result Value   RBC 5.34 (*)    Hemoglobin 9.3 (*)    HCT 34.5 (*)    MCV 64.6 (*)    MCH 17.4 (*)    MCHC 27.0 (*)    RDW 20.7 (*)    All other components within normal limits  COMPREHENSIVE METABOLIC PANEL - Abnormal; Notable for the following components:   Sodium 134 (*)     Creatinine, Ser 0.42 (*)    Calcium 8.3 (*)    All other components within normal limits  INFLUENZA PANEL BY PCR (TYPE A & B) - Abnormal; Notable for the following components:   Influenza A By PCR POSITIVE (*)    All other components within normal limits  LIPASE, BLOOD    EKG None  Radiology No results found.  Procedures Procedures (including critical care time) Procedure note: Ultrasound Guided Peripheral IV Ultrasound guided peripheral 1.88 inch angiocath IV placement  performed by me. Indications: Nursing unable to place IV. Details: The antecubital fossa and upper arm were evaluated with a multifrequency linear probe. Patent brachial veins were noted. 1 attempt was made to cannulate a vein under realtime US guidance with successful cannulation of the vein and catheter placement. There is return of non-pulsatile dark red blood. The patient tolerated the procedure well without complications. Images archived electronically.  CPT codes: 27253 and [phone]  Medications Ordered in ED Medications  sodium chloride 0.9 % bolus 1,000 mL (0 mLs Intravenous Stopped 03/10/19 2153)  ondansetron (ZOFRAN) injection 4 mg (4 mg Intravenous Given 03/10/19 2057)  acetaminophen (TYLENOL) solution 1,000 mg (1,000 mg Oral Given 03/10/19 2054)  ondansetron (ZOFRAN) injection 4 mg (4 mg Intravenous Given 03/10/19 2153)     Initial Impression / Assessment and Plan / ED Course  I have reviewed the triage vital signs and the nursing notes.  Pertinent labs & imaging results that were available during my care of the patient were reviewed by me and considered in my medical decision making (see chart for details).        33 yo F with a chief complaint of a fever nausea and vomiting.  Going on for the past couple days.  And and sick contacts.  She is well-appearing and nontoxic.  She is tachycardic into the 120s on arrival.  We will give a bolus of IV fluids nausea medicine check abdominal labs reassess.   Patient was found to have influenza A.  Lab work otherwise with mild anemia at her baseline, LFTs unremarkable lipase normal.  She was given a dose of Zofran with some improvement of her nausea was able to tolerate p.o.  Discharge home.  10:17 PM:  I have discussed the diagnosis/risks/treatment options with the patient and believe the pt to be eligible for discharge home to follow-up with PCP. We also discussed returning to the ED immediately if new or worsening sx occur. We discussed the sx which are most concerning (e.g., sudden worsening pain, fever, inability to tolerate by mouth) that necessitate immediate return. Medications administered to the patient during their visit and any new prescriptions provided to the patient are listed below.  Medications given during this visit Medications  sodium chloride 0.9 % bolus 1,000 mL (0 mLs Intravenous Stopped 03/10/19 2153)  ondansetron (ZOFRAN) injection 4 mg (4 mg Intravenous Given 03/10/19 2057)  acetaminophen (TYLENOL) solution 1,000 mg (1,000 mg Oral Given 03/10/19 2054)  ondansetron (ZOFRAN) injection 4 mg (4 mg Intravenous Given 03/10/19 2153)     The patient appears reasonably screen and/or stabilized for discharge and I doubt any other medical condition or other [facility] requiring further screening, evaluation, or treatment in the ED at this time prior to discharge.    Final Clinical Impressions(s) / ED Diagnoses   Final diagnoses:  Influenza A    ED Discharge Orders         Ordered    ondansetron (ZOFRAN ODT) 4 MG disintegrating tablet     03/10/19 2216           Melene Plan, DO 03/10/19 2217

## 2019-03-10 NOTE — ED Notes (Signed)
Bed: ZO10 Expected date:  Expected time:  Means of arrival:  Comments: Fever, cough, n,v

## 2019-03-10 NOTE — ED Notes (Signed)
This RN attempted to start IV x 2. Patient is a difficult IV start. This Clinical research associate has put in an IV team consult and requested for an Ultrasound IV from other certified staff members within the ED.

## 2019-03-10 NOTE — ED Notes (Signed)
Patient given discharge teaching and verbalized understanding. Patient was taken out of ED with wheelchair.   

## 2019-03-10 NOTE — ED Triage Notes (Signed)
Patient arrived by EMS from home. Pt c/o fever, body aches, N/V, and cough.   Pt was prescribed antibiotics and hasn't been able to keep medications down.   Pt has cerebral palsy. Pt is unable to ambulate.   T 101.1 F per EMS.

## 2019-04-20 ENCOUNTER — Encounter: Payer: Self-pay | Admitting: Podiatry

## 2019-04-20 ENCOUNTER — Ambulatory Visit: Payer: Medicaid Other | Admitting: Podiatry

## 2019-04-20 ENCOUNTER — Other Ambulatory Visit: Payer: Self-pay

## 2019-04-20 VITALS — BP 115/78 | HR 82 | Temp 97.3°F | Resp 16

## 2019-04-20 DIAGNOSIS — L600 Ingrowing nail: Secondary | ICD-10-CM

## 2019-04-20 DIAGNOSIS — M79609 Pain in unspecified limb: Principal | ICD-10-CM

## 2019-04-20 DIAGNOSIS — B351 Tinea unguium: Secondary | ICD-10-CM | POA: Diagnosis not present

## 2019-04-20 DIAGNOSIS — M79676 Pain in unspecified toe(s): Secondary | ICD-10-CM | POA: Diagnosis not present

## 2019-04-20 NOTE — Progress Notes (Signed)
Subjective:   Patient ID: Christine Gomez, female   DOB: 33 y.o.   MRN: [id number]   HPI Patient presents in wheelchair with some swelling of her feet and legs that is chronic and thick yellow brittle nailbeds that she cannot cut and has been increasingly hard to wear shoe gear with comfortably.  Patient does not smoke   Review of Systems  All other systems reviewed and are negative.       Objective:  Physical Exam Vitals signs and nursing note reviewed.  Constitutional:      Appearance: She is well-developed.  Pulmonary:     Effort: Pulmonary effort is normal.  Musculoskeletal: Normal range of motion.  Skin:    General: Skin is warm.  Neurological:     Mental Status: She is alert.     Neurovascular status found to be moderately diminished but intact with swelling of the legs which is more due to immobilization with thick yellow brittle nailbeds bilateral that are impossible to cut and very dystrophic and thickened.     Assessment:  Edema which is more due to immobilization and advised on rubbing techniques and possible elevation as best as possible with thick yellow brittle nailbeds 1-5 both feet that she cannot take care of     Plan:  H&P at education rendered to patient.  Debrided nailbeds 1-5 both feet with no iatrogenic bleeding and continue routine care in the future with the possibility for nail removal if necessary signed visit

## 2019-04-20 NOTE — Progress Notes (Signed)
   Subjective:    Patient ID: Christine Gomez, female    DOB: Apr 11, 1986, 33 y.o.   MRN: [id number]  HPI    Review of Systems  All other systems reviewed and are negative.      Objective:   Physical Exam        Assessment & Plan:

## 2019-07-20 ENCOUNTER — Encounter: Payer: Self-pay | Admitting: Podiatry

## 2019-07-20 ENCOUNTER — Other Ambulatory Visit: Payer: Self-pay

## 2019-07-20 ENCOUNTER — Ambulatory Visit (INDEPENDENT_AMBULATORY_CARE_PROVIDER_SITE_OTHER): Payer: Medicaid Other | Admitting: Podiatry

## 2019-07-20 DIAGNOSIS — M79676 Pain in unspecified toe(s): Secondary | ICD-10-CM | POA: Diagnosis not present

## 2019-07-20 DIAGNOSIS — B351 Tinea unguium: Secondary | ICD-10-CM

## 2019-07-20 DIAGNOSIS — M79609 Pain in unspecified limb: Secondary | ICD-10-CM

## 2019-07-20 NOTE — Patient Instructions (Signed)

## 2019-07-24 NOTE — Progress Notes (Signed)
Subjective:  Christine Gomez presents to clinic today with cc of  painful, thick, discolored, elongated toenails 1-5 b/l that become tender and cannot cut because of thickness. Pain is aggravated when wearing enclosed shoe gear.  Patient is inquiring about medication for her fungal toenails.   Everardo Beals, NP is her PCP.   No Known Allergies   Objective: Physical Examination: Vascular Examination: Capillary refill time immediate x 10 digits.  Palpable DP/PT pulses b/l.  Digital hair present b/l.  No edema noted b/l.  Skin temperature gradient WNL b/l.  Dermatological Examination: Skin with normal turgor, texture and tone b/l.  No open wounds b/l.  No interdigital macerations noted b/l.  Elongated, thick, discolored brittle toenails with subungual debris and pain on dorsal palpation of nailbeds 1-5 b/l.  Musculoskeletal Examination: Muscle strength 5/5 to all muscle groups b/l  No pain, crepitus or joint discomfort with active/passive ROM.  Neurological Examination: Sensation intact 5/5 b/l with 10 gram monofilament.  Vibratory sensation intact b/l.  Proprioceptive sensation intact b/l.  Assessment: Mycotic nail infection with pain 1-5 b/l  Plan: 1.  Toenails 1-5 b/l were debrided in length and girth without iatrogenic laceration. Rx written for nonformulary compounding topical antifungal: [facility]: Antifungal cream - Terbinafine 3%, Fluconazole 2%, Tea Tree Oil 5%, Urea 10%, Ibuprofen 2% in DMSO Suspension #64ml. Apply to the affected nail(s) at bedtime. 2.  Continue soft, supportive shoe gear daily. 3.  Report any pedal injuries to medical professional. 4.  Follow up 3 months. 5.  Patient/POA to call should there be a question/concern in there interim.

## 2019-10-18 ENCOUNTER — Ambulatory Visit: Payer: Medicaid Other | Admitting: Podiatry

## 2020-01-25 ENCOUNTER — Encounter: Payer: Self-pay | Admitting: Podiatry

## 2020-01-25 ENCOUNTER — Ambulatory Visit: Payer: Medicaid Other | Admitting: Podiatry

## 2020-01-25 ENCOUNTER — Other Ambulatory Visit: Payer: Self-pay

## 2020-01-25 DIAGNOSIS — M79676 Pain in unspecified toe(s): Secondary | ICD-10-CM

## 2020-01-25 DIAGNOSIS — B351 Tinea unguium: Secondary | ICD-10-CM | POA: Diagnosis not present

## 2020-01-25 DIAGNOSIS — M79609 Pain in unspecified limb: Secondary | ICD-10-CM

## 2020-01-25 NOTE — Patient Instructions (Signed)

## 2020-01-28 NOTE — Progress Notes (Signed)
Subjective: Christine Gomez presents today for follow up of painful mycotic nails b/l that are difficult to trim. Pain interferes with ambulation. Aggravating factors include wearing enclosed shoe gear. Pain is relieved with periodic professional debridement.   No Known Allergies   Objective: There were no vitals filed for this visit.  Vascular Examination:  Capillary refill time to digits immediate b/l, palpable DP pulses b/l, palpable PT pulses b/l, pedal hair present b/l and skin temperature gradient within normal limits b/l  Dermatological Examination: Pedal skin with normal turgor, texture and tone bilaterally, no open wounds bilaterally, no interdigital macerations bilaterally and toenails 1-5 b/l elongated, dystrophic, thickened, crumbly with subungual debris  Musculoskeletal: Noted disuse atrophy b/l LE, no gross bony deformities bilaterally and no pain crepitus or joint limitation noted with ROM b/l  Neurological: Protective sensation intact 5/5 intact bilaterally with 10g monofilament b/l and vibratory sensation intact b/l  Assessment: 1. Pain due to onychomycosis of nail     Plan: -Toenails 1-5 b/l were debrided in length and girth without iatrogenic bleeding. -Patient to continue soft, supportive shoe gear daily. -Patient to report any pedal injuries to medical professional immediately. -Patient/POA to call should there be question/concern in the interim.  Return in about 3 months (around 04/24/2020) for nail trim.

## 2020-04-25 ENCOUNTER — Ambulatory Visit (INDEPENDENT_AMBULATORY_CARE_PROVIDER_SITE_OTHER): Payer: Medicaid Other | Admitting: Podiatry

## 2020-04-25 ENCOUNTER — Encounter: Payer: Self-pay | Admitting: Podiatry

## 2020-04-25 ENCOUNTER — Other Ambulatory Visit: Payer: Self-pay

## 2020-04-25 DIAGNOSIS — R32 Unspecified urinary incontinence: Secondary | ICD-10-CM | POA: Insufficient documentation

## 2020-04-25 DIAGNOSIS — M79609 Pain in unspecified limb: Secondary | ICD-10-CM

## 2020-04-25 DIAGNOSIS — M2180 Other specified acquired deformities of unspecified limb: Secondary | ICD-10-CM | POA: Insufficient documentation

## 2020-04-25 DIAGNOSIS — M24539 Contracture, unspecified wrist: Secondary | ICD-10-CM | POA: Insufficient documentation

## 2020-04-25 DIAGNOSIS — B351 Tinea unguium: Secondary | ICD-10-CM

## 2020-04-25 DIAGNOSIS — G809 Cerebral palsy, unspecified: Secondary | ICD-10-CM | POA: Insufficient documentation

## 2020-04-25 DIAGNOSIS — M24359 Pathological dislocation of unspecified hip, not elsewhere classified: Secondary | ICD-10-CM | POA: Insufficient documentation

## 2020-04-25 DIAGNOSIS — L601 Onycholysis: Secondary | ICD-10-CM

## 2020-04-25 DIAGNOSIS — M20039 Swan-neck deformity of unspecified finger(s): Secondary | ICD-10-CM | POA: Insufficient documentation

## 2020-04-25 DIAGNOSIS — M21939 Unspecified acquired deformity of unspecified forearm: Secondary | ICD-10-CM | POA: Insufficient documentation

## 2020-04-25 NOTE — Patient Instructions (Addendum)
Apply Neosporin Cream to right great toe nailbed once daily for 10 days. Call office if you have any problems.  Onychomycosis/Fungal Toenails  WHAT IS IT? An infection that lies within the keratin of your nail plate that is caused by a fungus.  WHY ME? Fungal infections affect all ages, sexes, races, and creeds.  There may be many factors that predispose you to a fungal infection such as age, coexisting medical conditions such as diabetes, or an autoimmune disease; stress, medications, fatigue, genetics, etc.  Bottom line: fungus thrives in a warm, moist environment and your shoes offer such a location.  IS IT CONTAGIOUS? Theoretically, yes.  You do not want to share shoes, nail clippers or files with someone who has fungal toenails.  Walking around barefoot in the same room or sleeping in the same bed is unlikely to transfer the organism.  It is important to realize, however, that fungus can spread easily from one nail to the next on the same foot.  HOW DO WE TREAT THIS?  There are several ways to treat this condition.  Treatment may depend on many factors such as age, medications, pregnancy, liver and kidney conditions, etc.  It is best to ask your doctor which options are available to you.  1. No treatment.   Unlike many other medical concerns, you can live with this condition.  However for many people this can be a painful condition and may lead to ingrown toenails or a bacterial infection.  It is recommended that you keep the nails cut short to help reduce the amount of fungal nail. 2. Topical treatment.  These range from herbal remedies to prescription strength nail lacquers.  About 40-50% effective, topicals require twice daily application for approximately 9 to 12 months or until an entirely new nail has grown out.  The most effective topicals are medical grade medications available through physicians offices. 3. Oral antifungal medications.  With an 80-90% cure rate, the most common oral  medication requires 3 to 4 months of therapy and stays in your system for a year as the new nail grows out.  Oral antifungal medications do require blood work to make sure it is a safe drug for you.  A liver function panel will be performed prior to starting the medication and after the first month of treatment.  It is important to have the blood work performed to avoid any harmful side effects.  In general, this medication safe but blood work is required. 4. Laser Therapy.  This treatment is performed by applying a specialized laser to the affected nail plate.  This therapy is noninvasive, fast, and non-painful.  It is not covered by insurance and is therefore, out of pocket.  The results have been very good with a 80-95% cure rate.  The [facility] is the only practice in the area to offer this therapy. 5. Permanent Nail Avulsion.  Removing the entire nail so that a new nail will not grow back.

## 2020-04-30 NOTE — Progress Notes (Signed)
Subjective: Christine Gomez is a 34 y.o. female patient seen today painful mycotic nails b/l that are difficult to trim. Pain interferes with ambulation. Aggravating factors include wearing enclosed shoe gear. Pain is relieved with periodic professional debridement   She is using compounded topical antifungal solution from [place]. She voices no new pedal concerns on today's visit.  She is excited her shoe line has been doing well and is hoping the children's line will take off as well. She is also wanting to Thereasa Parkin a new book in the near future.  Patient Active Problem List   Diagnosis Date Noted  . Cerebral palsy (HCC) 04/25/2020  . 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 Other acquired deformity of other parts of limb 04/25/2020  . 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 Unspecified deformity of forearm, excluding fingers 04/25/2020  . Urinary incontinence 04/25/2020  . Iron deficiency anemia due to chronic blood loss 07/10/2017    Current Outpatient Medications on File Prior to Visit  Medication Sig Dispense Refill  . diclofenac (VOLTAREN) 75 MG EC tablet diclofenac sodium 75 mg tablet,delayed release  Take 1 tablet twice a day by oral route for 30 days.    Marland Kitchen docusate sodium (COLACE) 100 MG capsule Colace 100 mg capsule  Take 1 capsule every day by oral route as needed for 30 days.    Marland Kitchen loratadine (CLARITIN) 10 MG tablet Take 10 mg by mouth daily as needed for allergies.    . naproxen sodium (ALEVE) 220 MG tablet Take 440 mg by mouth daily as needed (fever).    . NON FORMULARY Antifungal solution for toenails sent to [facility], faxed 07/20/2019 [facility]    . ondansetron (ZOFRAN-ODT) 4 MG disintegrating tablet ondansetron 4 mg disintegrating tablet  TK 1 T PO Q 4 H PRN FOR NAUSEA/VOMITING    . pantoprazole (PROTONIX) 40 MG tablet pantoprazole 40 mg tablet,delayed release  TAKE 1 TABLET BY  MOUTH EVERY DAY     No current facility-administered medications on file prior to visit.    No Known Allergies  Objective: Physical Exam  General: Well developed, nourished, no acute distress, awake, alert and oriented x 3  Vascular:  Dorsalis pedis artery 2/4 bilateral. Posterior tibial artery 2/4 bilateral. Skin temperature gradient warm to warm proximal to distal bilateral lower extremities. No varicosities b/l. Pedal hair present bilateral.  Neurological: Gross sensation present via light touch bilateral.   Dermatological: Skin is warm, dry, and supple bilateral. Nails 1-10 are tender, long, thick, and discolored with subungal debris. No interdigital macerations present bilateral. No open lesions present bilateral. No callus/corns/hyperkeratotic tissue present bilateral. No signs of infection bilateral.  There is noted onchyolysis of entire nailplate of the right great toe.  The nailbed remains intact. There is no erythema, no edema, no drainage, no underlying flocculence.  Musculoskeletal: No symptomatic bony deformities noted bilateral. Disuse atrophy b/l LE.  No pain with calf compression bilateral. She utilized a motorized chair for mobility.  Assessment and Plan:  Problem List Items Addressed This Visit    None    Visit Diagnoses    Pain due to onychomycosis of nail    -  Primary   Onycholysis of toenail         -Examined patient.  -Discussed treatment options for painful mycotic nails. -Toenails 1-5 b/l were debrided in length and girth with sterile nail nippers and dremel without iatrogenic bleeding.  -Loose nailplate gently debirded  and  curretaged R hallux. Digit cleansed with alcohol and triple antibiotic ointment applied. Patient instructed to apply Neosporin Cream to R hallux once daily for 10 days. -Patient to continue soft, supportive shoe gear daily. -Patient to report any pedal injuries to medical professional immediately. -Patient/POA to call should there be  question/concern in the interim.  Return in about 3 months (around 07/25/2020) for nail trim.  Marzetta Board, DPM

## 2020-05-08 ENCOUNTER — Ambulatory Visit: Payer: Medicaid Other | Attending: *Deleted

## 2020-05-08 ENCOUNTER — Other Ambulatory Visit: Payer: Self-pay

## 2020-05-08 ENCOUNTER — Ambulatory Visit: Payer: Medicaid Other | Admitting: Occupational Therapy

## 2020-05-08 DIAGNOSIS — R2689 Other abnormalities of gait and mobility: Secondary | ICD-10-CM | POA: Diagnosis present

## 2020-05-08 DIAGNOSIS — M6249 Contracture of muscle, multiple sites: Secondary | ICD-10-CM | POA: Insufficient documentation

## 2020-05-08 DIAGNOSIS — R29818 Other symptoms and signs involving the nervous system: Secondary | ICD-10-CM | POA: Diagnosis present

## 2020-05-08 DIAGNOSIS — M6281 Muscle weakness (generalized): Secondary | ICD-10-CM | POA: Diagnosis present

## 2020-05-08 DIAGNOSIS — G800 Spastic quadriplegic cerebral palsy: Secondary | ICD-10-CM | POA: Diagnosis present

## 2020-05-08 DIAGNOSIS — R293 Abnormal posture: Secondary | ICD-10-CM | POA: Diagnosis present

## 2020-05-08 NOTE — Therapy (Signed)
[facility] [address] [place], [place], [address] Phone: [phone]   Fax:  [phone]  Physical Therapy Evaluation  Patient Details  Name: Christine Gomez MRN: [id number] Date of Birth: 26-Jul-1986 Referring Provider (PT): Marva Panda   Encounter Date: 05/08/2020  PT End of Session - 05/08/20 1403    Visit Number  1    Number of Visits  11    Authorization Type  Medicaid    PT Start Time  1316    PT Stop Time  1403    PT Time Calculation (min)  47 min    Equipment Utilized During Treatment  Gait belt    Activity Tolerance  Patient tolerated treatment well    Behavior During Therapy  [facility] for tasks assessed/performed       Past Medical History:  Diagnosis Date  . Acid reflux   . Cerebral palsy [facility])     Past Surgical History:  Procedure Laterality Date  . dorsal rhizotomy     back ligament looosening surgery  . ESOPHAGOGASTRODUODENOSCOPY (EGD) WITH PROPOFOL N/A 10/08/2016   Procedure: ESOPHAGOGASTRODUODENOSCOPY (EGD) WITH PROPOFOL;  Surgeon: Willis Modena, MD;  Location: [facility];  Service: Endoscopy;  Laterality: N/A;  . left arm surgery      There were no vitals filed for this visit.   Subjective Assessment - 05/08/20 1320    Subjective  Pt was referred for CP. Pt reports that nothing per se has changed but she wants to try to be more independent and be able to get out more besides with her aides. She would like to be able to transfer better. Currently does total assist lift holding to her aides. Pt has aides M-Sunday 3 hours in morning and 1-2 hours in evening. They assist with bathing, dressing, all transfers. Pt is in her powerchair during the day. Uses incontinent supplies during the day. Aides can get her on toilet for number 2 if they are there, otherwise she holds it. Pt has been nonambulatory since 2006. She used gait trainer walker some at that time.    Pertinent History  CP, microcytic  hyochromic anemia, dorsal rhizotomy 1995, bilateral hip dislocation right after that.    Patient Stated Goals  To be able to transfer easier.    Currently in Pain?  Yes    Pain Score  0-No pain   worst 8-9/10   Pain Location  Knee    Pain Orientation  Left    Pain Descriptors / Indicators  Sharp    Pain Type  Chronic pain    Pain Onset  More than a month ago    Pain Frequency  Intermittent    Aggravating Factors   bending too far    Pain Relieving Factors  medication         [facility] PT Assessment - 05/08/20 1327      Assessment   Medical Diagnosis  Cerebral Palsy     Referring Provider (PT)  Marva Panda    Onset Date/Surgical Date  --   congenital   Hand Dominance  Right    Prior Therapy  last therapy in 2006      Precautions   Precautions  Fall      Balance Screen   Has the patient fallen in the past 6 months  No    Has the patient had a decrease in activity level because of a fear of falling?   No    Is the patient reluctant  to leave their home because of a fear of falling?   No      Home Environment   Living Environment  Private residence    Living Arrangements  Non-relatives/Friends   2 roommates   Available Help at Discharge  Personal care attendant   aide M-Sun 4-5 hours   Type of Home  Apartment    Home Layout  One level    Home Equipment  Hand held shower head;Wheelchair - power;Tub bench   automatic bed   Additional Comments  her powerchair has active reach feature.      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, reading, writing, music, movies      Cognition   Overall Cognitive Status  Within Functional Limits for tasks assessed      Observation/Other Assessments   Skin Integrity  intact currently      Sensation   Light Touch  Appears Intact      Posture/Postural  Control   Posture/Postural Control  Postural limitations    Postural Limitations  Rounded Shoulders    Posture Comments  has lateral supports on powerchair      ROM / Strength   AROM / PROM / Strength  PROM;Strength      PROM   Overall PROM Comments  Left ankle DF=-12, right -2,  Left knee extension -30, right=-14      Strength   Overall Strength Comments  Strength measures within available range due to spasticity/contractures    Strength Assessment Site  Hip;Knee;Ankle;Shoulder;Elbow;Hand    Right/Left Shoulder  Right;Left    Right Shoulder Flexion  4/5    Left Shoulder Flexion  2-/5    Right/Left Elbow  Right;Left    Right Elbow Flexion  5/5    Right Elbow Extension  4/5   within available range, lacking about 20 degrees   Left Elbow Flexion  3+/5    Left Elbow Extension  3+/5   within available range, lacking about 40 degrees   Right/Left hand  Right;Left    Right Hand Gross Grasp  Functional    Left Hand Gross Grasp  Impaired   Pt's hand in flexed position. Uses pinch between thumb/1st    Right/Left Hip  Right;Left    Right Hip Flexion  2-/5    Left Hip Flexion  2-/5    Right/Left Knee  Right;Left    Right Knee Flexion  3+/5    Right Knee Extension  3+/5    Left Knee Flexion  2-/5    Left Knee Extension  2-/5    Right/Left Ankle  Right;Left    Right Ankle Dorsiflexion  2-/5    Right Ankle Plantar Flexion  2-/5    Left Ankle Dorsiflexion  1/5    Left Ankle Plantar Flexion  1/5      Transfers   Transfers  Sit to Stand;Stand to Sit    Sit to Stand  2: Max assist    Sit to Stand Details (indicate cue type and reason)  Sit to stand at wheelchair max assist. Pt able to bare some weight through legs standing briefly max assist in flexed posture     Comments  Pt currently total assist w/c to bed transfers per report "bear hugging" aides.  Objective measurements completed on examination: See above findings.              PT Education -  05/08/20 1921    Education Details  PT plan of care.    Person(s) Educated  Patient    Methods  Explanation    Comprehension  Verbalized understanding       PT Short Term Goals - 05/09/20 0811      PT SHORT TERM GOAL #1   Title  Pt will be able to perform initial HEP for flexibility/ROM, strengthening with assist of aides for improved mobility.    Baseline  no current HEP    Time  4    Period  Weeks    Status  New    Target Date  06/08/20      PT SHORT TERM GOAL #2   Title  Pt will be able to perform slideboard transfer max assist of 1 person for improved mobility.    Baseline  total assist dependent transfer with her aide picking her up.    Time  4    Period  Weeks    Status  New    Target Date  06/08/20      PT SHORT TERM GOAL #3   Title  Pt will be able to stand in [place] x 2 min to allow weight bearing through legs and improve strength.    Baseline  Max assist to perform partial stand for a couple seconds.    Time  4    Period  Weeks    Status  New    Target Date  06/08/20      PT SHORT TERM GOAL #4   Title  Pt will be able to perform bed mobility mod assist for improved function.    Baseline  unable to assess at eval but per report aides get her in/out of bed.    Time  4    Period  Weeks    Status  New    Target Date  06/08/20        PT Long Term Goals - 05/09/20 0816      PT LONG TERM GOAL #1   Title  Pt will be independent with progress strengthening, flexibility and mobility program for HEP to continue on own.    Baseline  no current HEP    Time  8    Period  Weeks    Status  New    Target Date  07/08/20      PT LONG TERM GOAL #2   Title  Pt will be able to perform slideboard transfer versus squat/pivot mod assist for improved ability to participate with transfers.    Baseline  total assist    Time  8    Period  Weeks    Status  New    Target Date  06/08/20      PT LONG TERM GOAL #3   Title  Pt will be able to stand at counter x 3 min with UE  support min assist for improved functional strength and assist with ADLs.    Baseline  Max assist for partial stand a couple seconds.    Time  8    Period  Weeks    Status  New    Target Date  07/08/20      PT LONG TERM GOAL #4   Title  Pt will increase knee extension ROM >5 degrees for improved standing ability.    Baseline  right=-14 and left=-30 degrees passively.    Time  8    Period  Weeks    Status  New    Target Date  07/08/20             Plan - 05/08/20 1924    Clinical Impression Statement  Pt was referred to PT with diagnosis of CP. Pt presents with decreased strength throughout left worse than right with left arm most affected. Pt also has increased tone/spasticity more prominent on left. Decreased knee extension and ankle DF due to this. Pt currently nonambulatory using powerchair and has been for many years. Was able to tolerate some weight through legs max assist at eval today. Pt is currently total assist with transfers from her aides. Would like to be able to transfer easier. Pt will benefit from skilled PT to address strength, flexibility and transfers to improve function in home.    Personal Factors and Comorbidities  Comorbidity 2    Comorbidities  microcytic hypochromic anemia, history of dorsal rhizotomy    Examination-Activity Limitations  Transfers;Bed Mobility;Toileting    Examination-Participation Restrictions  Interpersonal Relationship    Stability/Clinical Decision Making  Stable/Uncomplicated    Clinical Decision Making  Low    Rehab Potential  Good    PT Frequency  2x / week   followed by 1x/week for 6 weeks, plus eval   PT Duration  2 weeks    PT Treatment/Interventions  ADLs/Self Care Home Management;Functional mobility training;Therapeutic activities;DME Instruction;Neuromuscular re-education;Balance training;Therapeutic exercise;Gait training;Patient/family education;Manual techniques;Passive range of motion    PT Next Visit Plan  Try slideboard  transfer. Will want 2nd person for safety. Assess bed mobility. Establish initial stretching program that she can do with her aides. Perhaps try standing in [place]?    Consulted and Agree with Plan of Care  Patient       Patient will benefit from skilled therapeutic intervention in order to improve the following deficits and impairments:  Decreased balance, Decreased mobility, Decreased range of motion, Decreased knowledge of use of DME, Decreased strength, Impaired tone, Impaired flexibility, Postural dysfunction, Impaired UE functional use  Visit Diagnosis: Abnormal posture  Muscle weakness (generalized)  Other abnormalities of gait and mobility     Problem List Patient Active Problem List   Diagnosis Date Noted  . Cerebral palsy ([place]) 04/25/2020  . 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 Other acquired deformity of other parts of limb 04/25/2020  . 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 Unspecified deformity of forearm, excluding fingers 04/25/2020  . Urinary incontinence 04/25/2020  . Iron deficiency anemia due to chronic blood loss 07/10/2017    Electa Sniff, PT, DPT, NCS 05/09/2020, 8:25 AM  [place] [address] [place], [place], [address] Phone: [phone]   Fax:  [phone]  Name: Christine Gomez MRN: [id number] Date of Birth: 1986/09/16

## 2020-05-08 NOTE — Therapy (Signed)
[facility] [address] [place], [place], [address] Phone: [phone]   Fax:  [phone]  Occupational Therapy Evaluation  Patient Details  Name: Christine Gomez MRN: [id number] Date of Birth: [birth year] Referring Provider (OT): Everardo Beals, NP   Encounter Date: 05/08/2020  OT End of Session - 05/08/20 1338    Visit Number  1    Number of Visits  12    Date for OT Re-Evaluation  07/08/20    Authorization Type  MCD - awaiting authorization    OT Start Time  1230    OT Stop Time  1315    OT Time Calculation (min)  45 min    Activity Tolerance  Patient tolerated treatment well       Past Medical History:  Diagnosis Date  . Acid reflux   . Cerebral palsy [facility]     Past Surgical History:  Procedure Laterality Date  . dorsal rhizotomy     back ligament looosening surgery  . ESOPHAGOGASTRODUODENOSCOPY (EGD) WITH PROPOFOL N/A 10/08/2016   Procedure: ESOPHAGOGASTRODUODENOSCOPY (EGD) WITH PROPOFOL;  Surgeon: Arta Silence, MD;  Location: [facility];  Service: Endoscopy;  Laterality: N/A;  . left arm surgery      There were no vitals filed for this visit.  Subjective Assessment - 05/08/20 1241    Pertinent History  CP, bilateral hip dislocations, dorsal rhizotomy either '95 or '96    Currently in Pain?  Yes   Lt knee, bilateral hips w/ abduction - see P.T. evaluation for more details       [facility] OT Assessment - 05/08/20 0001      Assessment   Medical Diagnosis  Cerebral Palsy    (LUE side spasticity, bilateral LE spasticity but Lt worse)   Referring Provider (OT)  Everardo Beals, NP    Onset Date/Surgical Date  --   congenital    Hand Dominance  Right    Prior Therapy  only therapy as child, none as adult      Precautions   Precaution Comments  ? w/ hip dislocations      Balance Screen   Has the patient fallen in the past 6 months  No      Home  Environment   Bathroom Shower/Tub   Walk-in Shower    Additional Comments  Pt lives in 1st floor apartment with 2 roommates. Pt has in home nurses come M-Sun approx 4.5 hours/day to assist w/ ADLS. DME: powered w/c, manual w/c, tub bench,.     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[employer], pt also has own shoe line   has 2 bachelor's degrees     ADL   Eating/Feeding  Needs assist with cutting food    Grooming  Set up   however aide assist with styling hair/putting up   Upper Body Bathing  + 1 Total asssestance   on tub bench in shower   Lower Body Bathing  + 1 Total assistance   on tub bench in shower   Upper Body Dressing  + 1 Total assistance   in wheelchair   Lower Body Dressing  +1 Total aassistance   in bed   Toilet Transfer  + 1 Total assistance   using depends style diaper, transfers for BM  Toileting -  Hygiene  + 1 Total assistance    Tub/Shower Transfer  + 1 Total assistance    ADL comments  In home aide does grocery shopping once every 2 weeks and laundry prn      IADL   Meal Prep  Able to complete simple warm meal prep   Heats up a lot of frozen dinners     Mobility   Mobility Status Comments  w/c bound      Written Expression   Dominant Hand  Right      Vision - History   Baseline Vision  No visual deficits      Cognition   Overall Cognitive Status  Within Functional Limits for tasks assessed      Posture/Postural Control   Posture/Postural Control  Postural limitations    Postural Limitations  Rounded Shoulders;Weight shift left    Posture Comments  requires lateral supports on w/c      Sensation   Light Touch  Appears Intact      Edema   Edema  none in UE's but reports in bilateral feet      Tone   Assessment Location  Right Upper Extremity;Left Upper Extremity      ROM / Strength   AROM / PROM / Strength  AROM      AROM   Overall  AROM Comments  RUE shoulder WFL's, elbow flexion WFL's, limited approx 25* elbow ext, wrist ext limited w/ some flexion of thumb IP joint and index finger, goes into wrist flex for full finger ext. LUE dominated by spasticity w/ sh flex to approx 90*, elbow flexed/fixed at 90* flex, wrist in 70* flex and only can actively extend thumb and index       RUE Tone   RUE Tone  Mild;Hypertonic   w/ elbow ext, wrist ext, and finger ext     LUE Tone   LUE Tone  Severe;Hypertonic   wrist remains in exteme flex, fingers flex, sh to midrange                       OT Short Term Goals - 05/08/20 1346      OT SHORT TERM GOAL #1   Title  Pt to verbalize understanding with potential A/E needs    Baseline  not yet issued    Time  3    Period  Weeks    Status  New      OT SHORT TERM GOAL #2   Title  Pt to verbalize understanding with HEP    Baseline  not yet issued    Time  3    Period  Weeks    Status  New        OT Long Term Goals - 05/08/20 1347      OT LONG TERM GOAL #1   Title  Pt to perform UB dressing and bathing with mod assist and A/E prn    Baseline  total assist/dependent    Time  7    Period  Weeks    Status  New      OT LONG TERM GOAL #2   Title  Pt to perform dynamic sitting EOB with close supervision in prep for seated ADLS    Baseline  static only with hand support    Time  7    Period  Weeks    Status  New      OT LONG TERM GOAL #3  Title  Pt to perform sliding board transfers with max assist    Baseline  total assist    Time  7    Period  Weeks    Status  New            Plan - 05/08/20 1339    Clinical Impression Statement  Pt is a 34 y.o. female who presents to OPOT with diagnosis of cerebral palsy (spastic type Lt side worse than Rt) with goal to become more independent with ADLS and transfers. Pt would benefit from O.T. to address A/E needs, establish HEP, and increase independence/safety with BADLS and transfers if able. Pt has been  w/c bound for entire adult life and requires total assist to transfer.    OT Occupational Profile and History  Problem Focused Assessment - Including review of records relating to presenting problem    Occupational performance deficits (Please refer to evaluation for details):  ADL's;IADL's    Body Structure / Function / Physical Skills  ADL;ROM;IADL;Body mechanics;Improper spinal/pelvic alignment;Mobility;Muscle spasms;Flexibility;Tone;Coordination;Pain;UE functional use;Decreased knowledge of use of DME    Rehab Potential  Good   for goals set   Clinical Decision Making  Several treatment options, min-mod task modification necessary    Comorbidities Affecting Occupational Performance:  Presence of comorbidities impacting occupational performance    Comorbidities impacting occupational performance description:  spasticity and contractures related to CP    Modification or Assistance to Complete Evaluation   Min-Moderate modification of tasks or assist with assess necessary to complete eval    OT Frequency  1x / week   for 3 weeks (d/t MCD), followed by 2x/wk for up to 4 weeks   OT Treatment/Interventions  Self-care/ADL training;Therapeutic exercise;Functional Mobility Training;Neuromuscular education;Manual Therapy;Splinting;Therapeutic activities;DME and/or AE instruction;Passive range of motion;Patient/family education;Moist Heat    Plan  Discuss A/E options (for cutting food, chopping, bathing, etc), give info for Meals on Wheels    Consulted and Agree with Plan of Care  Patient       Patient will benefit from skilled therapeutic intervention in order to improve the following deficits and impairments:   Body Structure / Function / Physical Skills: ADL, ROM, IADL, Body mechanics, Improper spinal/pelvic alignment, Mobility, Muscle spasms, Flexibility, Tone, Coordination, Pain, UE functional use, Decreased knowledge of use of DME       Visit Diagnosis: Spastic quadriplegic cerebral palsy  (HCC)  Contracture of muscle, multiple sites  Abnormal posture  Other symptoms and signs involving the nervous system    Problem List Patient Active Problem List   Diagnosis Date Noted  . Cerebral palsy (HCC) 04/25/2020  . 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 Other acquired deformity of other parts of limb 04/25/2020  . 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 Unspecified deformity of forearm, excluding fingers 04/25/2020  . Urinary incontinence 04/25/2020  . Iron deficiency anemia due to chronic blood loss 07/10/2017    Kelli Churn, OTR/L 05/08/2020, 1:50 PM  [facility] [address] [place], [place], [address] Phone: [phone]   Fax:  [phone]  Name: Christine Gomez MRN: [id number] Date of Birth: [birth year]

## 2020-05-17 ENCOUNTER — Other Ambulatory Visit: Payer: Self-pay

## 2020-05-17 ENCOUNTER — Ambulatory Visit: Payer: Medicaid Other

## 2020-05-17 DIAGNOSIS — M6281 Muscle weakness (generalized): Secondary | ICD-10-CM

## 2020-05-17 DIAGNOSIS — R2689 Other abnormalities of gait and mobility: Secondary | ICD-10-CM

## 2020-05-17 DIAGNOSIS — R293 Abnormal posture: Secondary | ICD-10-CM | POA: Diagnosis not present

## 2020-05-17 NOTE — Therapy (Signed)
[facility] [address] [place], [place], [address] Phone: [phone]   Fax:  [phone]  Physical Therapy Treatment  Patient Details  Name: Christine Gomez MRN: [id number] Date of Birth: 05-26-1986 Referring Provider (PT): Marva Panda   Encounter Date: 05/17/2020  PT End of Session - 05/17/20 1938    Visit Number  2    Number of Visits  11    Authorization Type  Medicaid    PT Start Time  1617    PT Stop Time  1702    PT Time Calculation (min)  45 min    Equipment Utilized During Treatment  Gait belt    Activity Tolerance  Patient tolerated treatment well    Behavior During Therapy  [facility] for tasks assessed/performed       Past Medical History:  Diagnosis Date  . Acid reflux   . Cerebral palsy [facility])     Past Surgical History:  Procedure Laterality Date  . dorsal rhizotomy     back ligament looosening surgery  . ESOPHAGOGASTRODUODENOSCOPY (EGD) WITH PROPOFOL N/A 10/08/2016   Procedure: ESOPHAGOGASTRODUODENOSCOPY (EGD) WITH PROPOFOL;  Surgeon: Willis Modena, MD;  Location: [facility];  Service: Endoscopy;  Laterality: N/A;  . left arm surgery      There were no vitals filed for this visit.  Subjective Assessment - 05/17/20 1620    Subjective  Patient reports that her L knee has been giving her some pain. Primary doctor has referred her to the orthopedic doctor for further assessment.    Pertinent History  CP, microcytic hyochromic anemia, dorsal rhizotomy 1995, bilateral hip dislocation right after that.    Patient Stated Goals  To be able to transfer easier.    Currently in Pain?  No/denies    Pain Onset  More than a month ago                        [facility] Adult PT Treatment/Exercise - 05/17/20 1708      Bed Mobility   Bed Mobility  Supine to Sit;Sit to Supine;Rolling Right;Rolling Left    Rolling Right  Minimal Assistance - Patient > 75%    Rolling Left  Contact  Guard/Touching assist    Supine to Sit  Maximal Assistance - Patient - Patient 25-49%   MaxA for completion, assist w/ LE and trunk   Sit to Supine  Moderate Assistance - Patient 50-74%   assistance with LE and trunk lowering     Transfers   Transfers  Lateral/Scoot Transfers   sliding board transfer   Lateral/Scoot Transfers  With slide board;1: +1 Total assist    Comments  Completed slide board transfer from wc <> mat, verbal cues for hand placement and pushing with RUE for improved lateral scoot. PT providing assistance from front, with PT tech providing assistance at back.       Exercises   Exercises  Other Exercises    Other Exercises   PT providing manual stretching to BLE in today's session. Compelted hamstring stretch 2 x 1 min BLE, gastroc stretch 2 x 1 min each BLE, and hip adductors 2 x 1 min each BLE. Educated patient on potential caregiver coming to session to allow for further training on proper stretching regimen.                PT Short Term Goals - 05/09/20 0811      PT SHORT TERM GOAL #1   Title  Pt will be able to perform initial HEP for flexibility/ROM, strengthening with assist of aides for improved mobility.    Baseline  no current HEP    Time  4    Period  Weeks    Status  New    Target Date  06/08/20      PT SHORT TERM GOAL #2   Title  Pt will be able to perform slideboard transfer max assist of 1 person for improved mobility.    Baseline  total assist dependent transfer with her aide picking her up.    Time  4    Period  Weeks    Status  New    Target Date  06/08/20      PT SHORT TERM GOAL #3   Title  Pt will be able to stand in [place] x 2 min to allow weight bearing through legs and improve strength.    Baseline  Max assist to perform partial stand for a couple seconds.    Time  4    Period  Weeks    Status  New    Target Date  06/08/20      PT SHORT TERM GOAL #4   Title  Pt will be able to perform bed mobility mod assist for improved  function.    Baseline  unable to assess at eval but per report aides get her in/out of bed.    Time  4    Period  Weeks    Status  New    Target Date  06/08/20        PT Long Term Goals - 05/09/20 0816      PT LONG TERM GOAL #1   Title  Pt will be independent with progress strengthening, flexibility and mobility program for HEP to continue on own.    Baseline  no current HEP    Time  8    Period  Weeks    Status  New    Target Date  07/08/20      PT LONG TERM GOAL #2   Title  Pt will be able to perform slideboard transfer versus squat/pivot mod assist for improved ability to participate with transfers.    Baseline  total assist    Time  8    Period  Weeks    Status  New    Target Date  06/08/20      PT LONG TERM GOAL #3   Title  Pt will be able to stand at counter x 3 min with UE support min assist for improved functional strength and assist with ADLs.    Baseline  Max assist for partial stand a couple seconds.    Time  8    Period  Weeks    Status  New    Target Date  07/08/20      PT LONG TERM GOAL #4   Title  Pt will increase knee extension ROM >5 degrees for improved standing ability.    Baseline  right=-14 and left=-30 degrees passively.    Time  8    Period  Weeks    Status  New    Target Date  07/08/20            Plan - 05/17/20 1938    Clinical Impression Statement  Today's skilled PT session included transfer training and assessment of bed mobility. Overall patient requiring total A for sliding board transfer in today's session. Will continue to practice in further session  to promote independence. Also completed BLE stretching for improvements spasticity and ROM.    Personal Factors and Comorbidities  Comorbidity 2    Comorbidities  microcytic hypochromic anemia, history of dorsal rhizotomy    Examination-Activity Limitations  Transfers;Bed Mobility;Toileting    Examination-Participation Restrictions  Interpersonal Relationship    Stability/Clinical  Decision Making  Stable/Uncomplicated    Rehab Potential  Good    PT Frequency  2x / week   followed by 1x/week for 6 weeks, plus eval   PT Duration  2 weeks    PT Treatment/Interventions  ADLs/Self Care Home Management;Functional mobility training;Therapeutic activities;DME Instruction;Neuromuscular re-education;Balance training;Therapeutic exercise;Gait training;Patient/family education;Manual techniques;Passive range of motion    PT Next Visit Plan  Continue transfer training with slideboard. Will want 2nd person for safety. Establish initial stretching program that she can do with her aides. Perhaps try standing in [place]?    Consulted and Agree with Plan of Care  Patient       Patient will benefit from skilled therapeutic intervention in order to improve the following deficits and impairments:  Decreased balance, Decreased mobility, Decreased range of motion, Decreased knowledge of use of DME, Decreased strength, Impaired tone, Impaired flexibility, Postural dysfunction, Impaired UE functional use  Visit Diagnosis: Abnormal posture  Muscle weakness (generalized)  Other abnormalities of gait and mobility     Problem List Patient Active Problem List   Diagnosis Date Noted  . Cerebral palsy (HCC) 04/25/2020  . 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 Other acquired deformity of other parts of limb 04/25/2020  . 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 Unspecified deformity of forearm, excluding fingers 04/25/2020  . Urinary incontinence 04/25/2020  . Iron deficiency anemia due to chronic blood loss 07/10/2017    Tempie Donning, PT, DPT 05/17/2020, 7:43 PM  [facility] [address] [place], [place], [address] Phone: [phone]   Fax:  [phone]  Name: Christine Gomez MRN: [id number] Date of Birth: 02/20/1986

## 2020-05-18 ENCOUNTER — Ambulatory Visit: Payer: Medicaid Other | Admitting: Physical Therapy

## 2020-05-18 ENCOUNTER — Ambulatory Visit: Payer: Medicaid Other | Admitting: Occupational Therapy

## 2020-05-18 DIAGNOSIS — R2689 Other abnormalities of gait and mobility: Secondary | ICD-10-CM

## 2020-05-18 DIAGNOSIS — G800 Spastic quadriplegic cerebral palsy: Secondary | ICD-10-CM

## 2020-05-18 DIAGNOSIS — R293 Abnormal posture: Secondary | ICD-10-CM | POA: Diagnosis not present

## 2020-05-18 DIAGNOSIS — M6281 Muscle weakness (generalized): Secondary | ICD-10-CM

## 2020-05-18 DIAGNOSIS — R29818 Other symptoms and signs involving the nervous system: Secondary | ICD-10-CM

## 2020-05-18 NOTE — Therapy (Signed)
[facility] [address] [place], [place], [address] Phone: [phone]   Fax:  [phone]  Physical Therapy Treatment  Patient Details  Name: Christine Gomez MRN: [id number] Date of Birth: 18-Jun-1986 Referring Provider (PT): Marva Panda   Encounter Date: 05/18/2020  PT End of Session - 05/18/20 1603    Visit Number  3    Number of Visits  11    Authorization Type  Medicaid    PT Start Time  1318    PT Stop Time  1401    PT Time Calculation (min)  43 min    Equipment Utilized During Treatment  Gait belt    Activity Tolerance  Patient tolerated treatment well    Behavior During Therapy  [facility] for tasks assessed/performed       Past Medical History:  Diagnosis Date  . Acid reflux   . Cerebral palsy [facility])     Past Surgical History:  Procedure Laterality Date  . dorsal rhizotomy     back ligament looosening surgery  . ESOPHAGOGASTRODUODENOSCOPY (EGD) WITH PROPOFOL N/A 10/08/2016   Procedure: ESOPHAGOGASTRODUODENOSCOPY (EGD) WITH PROPOFOL;  Surgeon: Willis Modena, MD;  Location: [facility];  Service: Endoscopy;  Laterality: N/A;  . left arm surgery      There were no vitals filed for this visit.                     [facility] Adult PT Treatment/Exercise - 05/18/20 0001      Bed Mobility   Bed Mobility  Sit to Supine;Supine to Sit    Supine to Sit  Maximal Assistance - Patient - Patient 25-49%   for trunk and BLE   Sit to Supine  Moderate Assistance - Patient 50-74%   assist with trunk and BLE     Transfers   Transfers  Lateral/Scoot Transfers   using slide board   Lateral/Scoot Transfers  2: Max assist    Comments   Transferred via slide board and max assist to wheelchair towards the L on a level surface. Needed total A for slide board placement. Pt's BLE placed on block for increased weight bearing and pt able to help assist push through BLEs.  Therapist facilitating forward weight  shift and cues for head/hips relationship. Took approx.Marland Kitchen 3-4 scoots to get back in w/c. PT tech standing posteriorly for safety, with assisting during last scoot. At end of transfer, pt tilted back in w/c with therapist adjusting hips for proper midline positioning       Exercises   Exercises  Other Exercises    Other Exercises   Supine on mat table: hamstring stretch 3 x 30 seconds B, gastroc stretch 3 x 30 seconds B, hooklying hip ADD stretch 2 x 30 seconds, hip flexion PROM x10 reps B with additional 15 second stretch. Hooklying glute sets 1 x 10 reps, hooklying hip ABD bent knee fall outs x10 reps. With BLE over bolster x10 reps SAQs (pt unable to coordinate one leg at a time and instead performs both together, also with additional glute activation). Initiated HEP for initial stretching and BLE strengthening with caregiver assist (however pt not present during session) - provided handout with pictures and instructions (see below).          Access Code: [email or web address] URL: [email or web address] Date: 05/18/2020 Prepared by: Sherlie Ban  Exercises Supine Ankle Dorsiflexion Stretch with Caregiver - 2 x daily - 7 x weekly - 3 sets - 30 hold Supine Hamstring  Stretch with Caregiver - 2 x daily - 7 x weekly - 3 sets - 30 hold Hooklying Gluteal Sets - 2 x daily - 7 x weekly - 2 sets - 10 reps Bent Knee Fallouts - 1 x daily - 7 x weekly - 2 sets - 10 reps Hip and Knee Extension and Flexion Caregiver PROM - 2 x daily - 7 x weekly - 1 sets - 10 reps      PT Education - 05/18/20 1603    Education Details  initial HEP with caregiver assisting at home    Person(s) Educated  Patient    Methods  Explanation;Demonstration;Handout    Comprehension  Verbalized understanding;Returned demonstration       PT Short Term Goals - 05/09/20 0811      PT SHORT TERM GOAL #1   Title  Pt will be able to perform initial HEP for flexibility/ROM, strengthening with assist of aides for improved  mobility.    Baseline  no current HEP    Time  4    Period  Weeks    Status  New    Target Date  06/08/20      PT SHORT TERM GOAL #2   Title  Pt will be able to perform slideboard transfer max assist of 1 person for improved mobility.    Baseline  total assist dependent transfer with her aide picking her up.    Time  4    Period  Weeks    Status  New    Target Date  06/08/20      PT SHORT TERM GOAL #3   Title  Pt will be able to stand in [place] x 2 min to allow weight bearing through legs and improve strength.    Baseline  Max assist to perform partial stand for a couple seconds.    Time  4    Period  Weeks    Status  New    Target Date  06/08/20      PT SHORT TERM GOAL #4   Title  Pt will be able to perform bed mobility mod assist for improved function.    Baseline  unable to assess at eval but per report aides get her in/out of bed.    Time  4    Period  Weeks    Status  New    Target Date  06/08/20        PT Long Term Goals - 05/09/20 0816      PT LONG TERM GOAL #1   Title  Pt will be independent with progress strengthening, flexibility and mobility program for HEP to continue on own.    Baseline  no current HEP    Time  8    Period  Weeks    Status  New    Target Date  07/08/20      PT LONG TERM GOAL #2   Title  Pt will be able to perform slideboard transfer versus squat/pivot mod assist for improved ability to participate with transfers.    Baseline  total assist    Time  8    Period  Weeks    Status  New    Target Date  06/08/20      PT LONG TERM GOAL #3   Title  Pt will be able to stand at counter x 3 min with UE support min assist for improved functional strength and assist with ADLs.    Baseline  Max assist for  partial stand a couple seconds.    Time  8    Period  Weeks    Status  New    Target Date  07/08/20      PT LONG TERM GOAL #4   Title  Pt will increase knee extension ROM >5 degrees for improved standing ability.    Baseline  right=-14 and  left=-30 degrees passively.    Time  8    Period  Weeks    Status  New    Target Date  07/08/20            Plan - 05/18/20 1604    Clinical Impression Statement  Focus of today's skilled session was transfer training, bed mobility, and initiating HEP for BLE strengthening and stretching. Caregiver not present for HEP today, however provided handout with pictures and explanations. Discussed with pt having caregiver come in to next session to review. Pt requiring max A for sliding board transfer, pt able to help assist put weight through BLE with use of larger step under BLE. Will continue to progress towards LTGs.    Personal Factors and Comorbidities  Comorbidity 2    Comorbidities  microcytic hypochromic anemia, history of dorsal rhizotomy    Examination-Activity Limitations  Transfers;Bed Mobility;Toileting    Examination-Participation Restrictions  Interpersonal Relationship    Stability/Clinical Decision Making  Stable/Uncomplicated    Rehab Potential  Good    PT Frequency  2x / week   followed by 1x/week for 6 weeks, plus eval   PT Duration  2 weeks    PT Treatment/Interventions  ADLs/Self Care Home Management;Functional mobility training;Therapeutic activities;DME Instruction;Neuromuscular re-education;Balance training;Therapeutic exercise;Gait training;Patient/family education;Manual techniques;Passive range of motion    PT Next Visit Plan  Continue transfer training with slideboard. Will want 2nd person for safety. how did initial HEP go? supine strengthening. sitting balance. Perhaps try standing in [place]?    Consulted and Agree with Plan of Care  Patient       Patient will benefit from skilled therapeutic intervention in order to improve the following deficits and impairments:  Decreased balance, Decreased mobility, Decreased range of motion, Decreased knowledge of use of DME, Decreased strength, Impaired tone, Impaired flexibility, Postural dysfunction, Impaired UE functional  use  Visit Diagnosis: Abnormal posture  Muscle weakness (generalized)  Other abnormalities of gait and mobility     Problem List Patient Active Problem List   Diagnosis Date Noted  . Cerebral palsy ([place]) 04/25/2020  . 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 Other acquired deformity of other parts of limb 04/25/2020  . 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 Unspecified deformity of forearm, excluding fingers 04/25/2020  . Urinary incontinence 04/25/2020  . Iron deficiency anemia due to chronic blood loss 07/10/2017    Arliss Journey, PT, DPT  05/18/2020, 4:11 PM  [facility] [address] [place], [place], [address] Phone: [phone]   Fax:  [phone]  Name: SHARLYNE KOENEMAN MRN: [id number] Date of Birth: [birth year]

## 2020-05-18 NOTE — Patient Instructions (Signed)
Access Code: [email or web address] URL: [email or web address] Date: 05/18/2020 Prepared by: Sherlie Ban  Exercises Supine Ankle Dorsiflexion Stretch with Caregiver - 2 x daily - 7 x weekly - 3 sets - 30 hold Supine Hamstring Stretch with Caregiver - 2 x daily - 7 x weekly - 3 sets - 30 hold Hooklying Gluteal Sets - 2 x daily - 7 x weekly - 2 sets - 10 reps Bent Knee Fallouts - 1 x daily - 7 x weekly - 2 sets - 10 reps Hip and Knee Extension and Flexion Caregiver PROM - 2 x daily - 7 x weekly - 1 sets - 10 reps

## 2020-05-18 NOTE — Therapy (Signed)
[facility] [address] [place], [place], [address] Phone: [phone]   Fax:  [phone]  Occupational Therapy Treatment  Patient Details  Name: Christine Gomez MRN: [id number] Date of Birth: 1986/03/01 Referring Provider (OT): Marva Panda, NP   Encounter Date: 05/18/2020  OT End of Session - 05/18/20 1324    Visit Number  2    Number of Visits  12    Date for OT Re-Evaluation  07/08/20    Authorization Type  3 OT visits approved 5/21-6/10    Authorization Time Period  6/10    Authorization - Visit Number  1    Authorization - Number of Visits  3    OT Start Time  1230    OT Stop Time  1315    OT Time Calculation (min)  45 min    Activity Tolerance  Patient tolerated treatment well    Behavior During Therapy  [facility] for tasks assessed/performed       Past Medical History:  Diagnosis Date  . Acid reflux   . Cerebral palsy [facility])     Past Surgical History:  Procedure Laterality Date  . dorsal rhizotomy     back ligament looosening surgery  . ESOPHAGOGASTRODUODENOSCOPY (EGD) WITH PROPOFOL N/A 10/08/2016   Procedure: ESOPHAGOGASTRODUODENOSCOPY (EGD) WITH PROPOFOL;  Surgeon: Willis Modena, MD;  Location: [facility];  Service: Endoscopy;  Laterality: N/A;  . left arm surgery      There were no vitals filed for this visit.  Subjective Assessment - 05/18/20 1244    Subjective   I want to be as independent as possible - independence is the word!    Pertinent History  CP, bilateral hip dislocations, dorsal rhizotomy either '95 or '96    Currently in Pain?  Yes    Pain Score  3     Pain Location  Knee    Pain Orientation  Left    Pain Descriptors / Indicators  Aching    Pain Type  Chronic pain    Pain Onset  More than a month ago    Pain Frequency  Intermittent    Aggravating Factors   bending too far    Pain Relieving Factors  medication                   OT Treatments/Exercises (OP) -  05/18/20 0001      ADLs   LB Dressing  Transferred via slide board and max assist to mat table - level surface.  Facilitation for forward translation of weight.  Patient able to push with LE's throughout transfer when LE's up on block.  Worked on dynamic sitting balance.  Patient with posterior preference in most movements, although moves forward without resistance.      Bathing  Patient's homework is to attempt upper body bathing in shower before next session.      ADL Comments  Reviewed short and long term goals and overall care plan with patient.  Patient excited for greater independence with ADL             OT Education - 05/18/20 1323    Education Details  Reviewed OT goals    Person(s) Educated  Patient    Methods  Explanation    Comprehension  Verbalized understanding       OT Short Term Goals - 05/08/20 1346      OT SHORT TERM GOAL #1   Title  Pt to verbalize understanding with potential  A/E needs    Baseline  not yet issued    Time  3    Period  Weeks    Status  New      OT SHORT TERM GOAL #2   Title  Pt to verbalize understanding with HEP    Baseline  not yet issued    Time  3    Period  Weeks    Status  New        OT Long Term Goals - 05/08/20 1347      OT LONG TERM GOAL #1   Title  Pt to perform UB dressing and bathing with mod assist and A/E prn    Baseline  total assist/dependent    Time  7    Period  Weeks    Status  New      OT LONG TERM GOAL #2   Title  Pt to perform dynamic sitting EOB with close supervision in prep for seated ADLS    Baseline  static only with hand support    Time  7    Period  Weeks    Status  New      OT LONG TERM GOAL #3   Title  Pt to perform sliding board transfers with max assist    Baseline  total assist    Time  7    Period  Weeks    Status  New            Plan - 05/18/20 1325    Clinical Impression Statement  Patient is very energetic and eager for greater participation in ADL's even if it does not  free her from needing caregiver.    OT Frequency  1x / week    OT Duration  --   3 weeks initially   OT Treatment/Interventions  Self-care/ADL training;Therapeutic exercise;Functional Mobility Training;Neuromuscular education;Manual Therapy;Splinting;Therapeutic activities;DME and/or AE instruction;Passive range of motion;Patient/family education;Moist Heat    Plan  See how bathing went - trouble shoot issues, Discuss A/E options (for cutting food, chopping, bathing, etc), give info for Meals on Wheels,    Consulted and Agree with Plan of Care  Patient       Patient will benefit from skilled therapeutic intervention in order to improve the following deficits and impairments:           Visit Diagnosis: Abnormal posture  Muscle weakness (generalized)  Other abnormalities of gait and mobility  Spastic quadriplegic cerebral palsy (HCC)  Other symptoms and signs involving the nervous system    Problem List Patient Active Problem List   Diagnosis Date Noted  . Cerebral palsy ([place]) 04/25/2020  . 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 Other acquired deformity of other parts of limb 04/25/2020  . 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 Unspecified deformity of forearm, excluding fingers 04/25/2020  . Urinary incontinence 04/25/2020  . Iron deficiency anemia due to chronic blood loss 07/10/2017    Mariah Milling, OTR/L 05/18/2020, 1:28 PM  [facility] [address] [place], [place], [address] Phone: [phone]   Fax:  [phone]  Name: Christine Gomez MRN: [id number] Date of Birth: 02/28/1986

## 2020-05-21 ENCOUNTER — Other Ambulatory Visit: Payer: Self-pay

## 2020-05-21 ENCOUNTER — Ambulatory Visit: Payer: Medicaid Other

## 2020-05-21 DIAGNOSIS — M6281 Muscle weakness (generalized): Secondary | ICD-10-CM

## 2020-05-21 DIAGNOSIS — R293 Abnormal posture: Secondary | ICD-10-CM | POA: Diagnosis not present

## 2020-05-21 DIAGNOSIS — R2689 Other abnormalities of gait and mobility: Secondary | ICD-10-CM

## 2020-05-21 NOTE — Therapy (Signed)
[facility] [address] [place], [place], [address] Phone: [phone]   Fax:  [phone]  Physical Therapy Treatment  Patient Details  Name: Christine Gomez MRN: [id number] Date of Birth: 09-16-1986 Referring Provider (PT): Everardo Beals   Encounter Date: 05/21/2020  PT End of Session - 05/21/20 1320    Visit Number  4    Number of Visits  11    Authorization Type  Medicaid    PT Start Time  1230    PT Stop Time  1316    PT Time Calculation (min)  46 min    Equipment Utilized During Treatment  Gait belt    Activity Tolerance  Patient tolerated treatment well    Behavior During Therapy  [facility] for tasks assessed/performed       Past Medical History:  Diagnosis Date  . Acid reflux   . Cerebral palsy [facility])     Past Surgical History:  Procedure Laterality Date  . dorsal rhizotomy     back ligament looosening surgery  . ESOPHAGOGASTRODUODENOSCOPY (EGD) WITH PROPOFOL N/A 10/08/2016   Procedure: ESOPHAGOGASTRODUODENOSCOPY (EGD) WITH PROPOFOL;  Surgeon: Arta Silence, MD;  [facility];  Service: Endoscopy;  Laterality: N/A;  . left arm surgery      There were no vitals filed for this visit.  Subjective Assessment - 05/21/20 1232    Subjective  Patient reports that she was able to get herself dressed some in the chair and it went well. Reports feeling that she was in the chair majority of the weekend and feeling tight. No pain.    Pertinent History  CP, microcytic hyochromic anemia, dorsal rhizotomy 1995, bilateral hip dislocation right after that.    Patient Stated Goals  To be able to transfer easier.    Currently in Pain?  No/denies    Pain Onset  More than a month ago                        [facility] Adult PT Treatment/Exercise - 05/21/20 1320      Bed Mobility   Bed Mobility  Sit to Supine;Supine to Sit    Supine to Sit  Maximal Assistance - Patient - Patient 25-49%    Mod A  with LE, but require Max A for trunk   Sit to Supine  Moderate Assistance - Patient 50-74%   assistance with BLE     Transfers   Transfers  Lateral/Scoot Transfers   w/ sliding board   Lateral/Scoot Transfers  2: Max assist    Comments   Continued transfer training via slide board and max assist from mat <> wheelchair towards the L on level surface. Spent time with patient attempting to place sliding board, with patient demo difficulty with weight shift and utilizing UE for placement of board. Continued to require total A for slide board placement. Pt's BLE placed on 6" block to allow for increased weight bearing and allow for improved pushing through BLE to improve transfer. Verbal, tactile, and facilitation requiring for improved weight shift throughout transfer. Patient requiring assistance from PT for neutral alignment upon sitting on mat, and pt continue to tilt back in w/c with therapist to allow for midline positioning in w/c.       Neuro Re-ed    Neuro Re-ed Details   Seated on mat with 6" block under BLE for support, PT providing manual faciliation for upright posture with emphasis on patient demonstrating ability to  hold this position for 10-15 seconds w/o trunk support.  In this position, completed R/L weight shift with focus on improved hip clearance to allow for carryover to transfer training. Patient demonstrating increased difficulty with left weight shift, at times requiring Mod A from PT for completion, and return to neutral alignment.       Exercises   Exercises  Other Exercises    Other Exercises   Completed BLE stretching supine on mat table including: hamstring stretch 3 x 30 seconds B. Increased resistance to stretching noted with hamstring stretch due to spasms. Hook lying hip ADD stretch 3 x 30 -45 seconds B with PT providing overpressure for improved stretch to patient's tolerance.  Completed trunk rotations to R/L, hold for 30-45 seconds with PT providing overpressure. Focus  on active return to neutral for improved strengthening. Educated patient to continue to complete current stretching HEP, and benefits of completing at home to allow for increased time during therapy session for transfer and functional mobility training.                PT Short Term Goals - 05/09/20 4496      PT SHORT TERM GOAL #1   Title  Pt will be able to perform initial HEP for flexibility/ROM, strengthening with assist of aides for improved mobility.    Baseline  no current HEP    Time  4    Period  Weeks    Status  New    Target Date  06/08/20      PT SHORT TERM GOAL #2   Title  Pt will be able to perform slideboard transfer max assist of 1 person for improved mobility.    Baseline  total assist dependent transfer with her aide picking her up.    Time  4    Period  Weeks    Status  New    Target Date  06/08/20      PT SHORT TERM GOAL #3   Title  Pt will be able to stand in [place] x 2 min to allow weight bearing through legs and improve strength.    Baseline  Max assist to perform partial stand for a couple seconds.    Time  4    Period  Weeks    Status  New    Target Date  06/08/20      PT SHORT TERM GOAL #4   Title  Pt will be able to perform bed mobility mod assist for improved function.    Baseline  unable to assess at eval but per report aides get her in/out of bed.    Time  4    Period  Weeks    Status  New    Target Date  06/08/20        PT Long Term Goals - 05/09/20 0816      PT LONG TERM GOAL #1   Title  Pt will be independent with progress strengthening, flexibility and mobility program for HEP to continue on own.    Baseline  no current HEP    Time  8    Period  Weeks    Status  New    Target Date  07/08/20      PT LONG TERM GOAL #2   Title  Pt will be able to perform slideboard transfer versus squat/pivot mod assist for improved ability to participate with transfers.    Baseline  total assist    Time  8  Period  Weeks    Status  New     Target Date  06/08/20      PT LONG TERM GOAL #3   Title  Pt will be able to stand at counter x 3 min with UE support min assist for improved functional strength and assist with ADLs.    Baseline  Max assist for partial stand a couple seconds.    Time  8    Period  Weeks    Status  New    Target Date  07/08/20      PT LONG TERM GOAL #4   Title  Pt will increase knee extension ROM >5 degrees for improved standing ability.    Baseline  right=-14 and left=-30 degrees passively.    Time  8    Period  Weeks    Status  New    Target Date  07/08/20            Plan - 05/21/20 1338    Clinical Impression Statement  Today's skilled PT session included further transfer training with sliding board, and NMR focused on weight shifting and static seated balance to allow for improved transfer training. Continued to complete BLE stretching due to patients reports of increased tightness in BLE after sitting in w/c for extended period of time over the weekend. Pt will continue to benefit from skilled PT services to progress toward goals.    Personal Factors and Comorbidities  Comorbidity 2    Comorbidities  microcytic hypochromic anemia, history of dorsal rhizotomy    Examination-Activity Limitations  Transfers;Bed Mobility;Toileting    Examination-Participation Restrictions  Interpersonal Relationship    Stability/Clinical Decision Making  Stable/Uncomplicated    Rehab Potential  Good    PT Frequency  2x / week   followed by 1x/week for 6 weeks, plus eval   PT Duration  2 weeks    PT Treatment/Interventions  ADLs/Self Care Home Management;Functional mobility training;Therapeutic activities;DME Instruction;Neuromuscular re-education;Balance training;Therapeutic exercise;Gait training;Patient/family education;Manual techniques;Passive range of motion    PT Next Visit Plan  Continue transfer training with slideboard. Will want 2nd person for safety. how did initial HEP go? was caregiver/friend able to  complete with patient? supine strengthening. sitting balance. Perhaps try standing in [place]?    Consulted and Agree with Plan of Care  Patient       Patient will benefit from skilled therapeutic intervention in order to improve the following deficits and impairments:  Decreased balance, Decreased mobility, Decreased range of motion, Decreased knowledge of use of DME, Decreased strength, Impaired tone, Impaired flexibility, Postural dysfunction, Impaired UE functional use  Visit Diagnosis: Abnormal posture  Muscle weakness (generalized)  Other abnormalities of gait and mobility     Problem List Patient Active Problem List   Diagnosis Date Noted  . Cerebral palsy ([facility]) 04/25/2020  . 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 Other acquired deformity of other parts of limb 04/25/2020  . 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 Unspecified deformity of forearm, excluding fingers 04/25/2020  . Urinary incontinence 04/25/2020  . Iron deficiency anemia due to chronic blood loss 07/10/2017    Tempie Donning, PT, DPT 05/21/2020, 1:41 PM  [facility] [address] [place], [place], [address] Phone: [phone]   Fax:  [phone]  Name: Christine Gomez MRN: [id number] Date of Birth: 1986-12-25

## 2020-05-23 ENCOUNTER — Ambulatory Visit: Payer: Medicaid Other | Admitting: Occupational Therapy

## 2020-05-23 ENCOUNTER — Encounter: Payer: Self-pay | Admitting: Occupational Therapy

## 2020-05-23 ENCOUNTER — Other Ambulatory Visit: Payer: Self-pay

## 2020-05-23 DIAGNOSIS — R29818 Other symptoms and signs involving the nervous system: Secondary | ICD-10-CM

## 2020-05-23 DIAGNOSIS — R293 Abnormal posture: Secondary | ICD-10-CM

## 2020-05-23 DIAGNOSIS — G800 Spastic quadriplegic cerebral palsy: Secondary | ICD-10-CM

## 2020-05-23 DIAGNOSIS — M6281 Muscle weakness (generalized): Secondary | ICD-10-CM

## 2020-05-23 NOTE — Therapy (Signed)
[facility] [address] [place], [place], [address] Phone: [phone]   Fax:  [phone]  Occupational Therapy Treatment  Patient Details  Name: Christine Gomez MRN: [id number] Date of Birth: 06-Mar-1986 Referring Provider (OT): Marva Panda, NP   Encounter Date: 05/23/2020  OT End of Session - 05/23/20 1804    Visit Number  3    Number of Visits  12    Date for OT Re-Evaluation  07/08/20    Authorization Type  3 OT visits approved 5/21-6/10    Authorization Time Period  6/10    Authorization - Visit Number  2    Authorization - Number of Visits  3    OT Start Time  1618    OT Stop Time  1700    OT Time Calculation (min)  42 min    Activity Tolerance  Patient tolerated treatment well    Behavior During Therapy  [facility] for tasks assessed/performed       Past Medical History:  Diagnosis Date  . Acid reflux   . Cerebral palsy [facility])     Past Surgical History:  Procedure Laterality Date  . dorsal rhizotomy     back ligament looosening surgery  . ESOPHAGOGASTRODUODENOSCOPY (EGD) WITH PROPOFOL N/A 10/08/2016   Procedure: ESOPHAGOGASTRODUODENOSCOPY (EGD) WITH PROPOFOL;  Surgeon: Willis Modena, MD;  Location: [facility];  Service: Endoscopy;  Laterality: N/A;  . left arm surgery      There were no vitals filed for this visit.  Subjective Assessment - 05/23/20 1758    Subjective   She put the soap on the loofah, and I washed the front of me and my thighs!  I did that twice now!    Currently in Pain?  No/denies    Pain Score  0-No pain                   OT Treatments/Exercises (OP) - 05/23/20 0001      ADLs   Functional Mobility  Patient able to flip up left arm rest, and direct therapist to break down chair for transfer.  Patient able to align wheelchair to mat table for transfer.  Patient able to close open her seat belt with increased time.  Worked on increased particiaption with transfers  level surface using a slide board.  Patient able to weight shit sufficiently so slide board could be placed.  Patient lacks strength and fucntional movement of LUE to grasp, maneuver slide board with LUE.  Transfer toward the left with max assist to get out of chair.  Worked on dynamic sitting balance, specifically left lat fllexion and retunr to center as needed for scooting, toileting, transfers.  Today patient able to flex forearm down to mat table and return to center 3 x/s with encouragement but not physical assistance.  Transfer back to wheelchair toward right patient = 60% work, therapist ensured feet in contact with floor, and proper slide board placement,                 OT Short Term Goals - 05/23/20 1806      OT SHORT TERM GOAL #1   Title  Pt to verbalize understanding with potential A/E needs    Status  On-going      OT SHORT TERM GOAL #2   Title  Pt to verbalize understanding with HEP    Status  On-going        OT Long Term Goals - 05/08/20 1347  OT LONG TERM GOAL #1   Title  Pt to perform UB dressing and bathing with mod assist and A/E prn    Baseline  total assist/dependent    Time  7    Period  Weeks    Status  New      OT LONG TERM GOAL #2   Title  Pt to perform dynamic sitting EOB with close supervision in prep for seated ADLS    Baseline  static only with hand support    Time  7    Period  Weeks    Status  New      OT LONG TERM GOAL #3   Title  Pt to perform sliding board transfers with max assist    Baseline  total assist    Time  7    Period  Weeks    Status  New            Plan - 05/23/20 1806    OT Treatment/Interventions  Cognitive remediation/compensation    Plan  STG - Submit for reauth.  Discuss A/E options (for cutting food, chopping, bathing, etc), dynamic sitting, dressing options    Consulted and Agree with Plan of Care  Patient       Patient will benefit from skilled therapeutic intervention in order to improve the  following deficits and impairments:           Visit Diagnosis: Abnormal posture  Muscle weakness (generalized)  Spastic quadriplegic cerebral palsy (HCC)  Other symptoms and signs involving the nervous system    Problem List Patient Active Problem List   Diagnosis Date Noted  . Cerebral palsy ([place]) 04/25/2020  . 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 Other acquired deformity of other parts of limb 04/25/2020  . 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 Unspecified deformity of forearm, excluding fingers 04/25/2020  . Urinary incontinence 04/25/2020  . Iron deficiency anemia due to chronic blood loss 07/10/2017    Mariah Milling, OTR/L 05/23/2020, 6:07 PM  [facility] [address] [place], [place], [address] Phone: [phone]   Fax:  [phone]  Name: Christine Gomez MRN: [id number] Date of Birth: 19-Apr-1986

## 2020-05-25 ENCOUNTER — Other Ambulatory Visit: Payer: Self-pay

## 2020-05-25 ENCOUNTER — Ambulatory Visit: Payer: Medicaid Other

## 2020-05-25 DIAGNOSIS — R2689 Other abnormalities of gait and mobility: Secondary | ICD-10-CM

## 2020-05-25 DIAGNOSIS — R293 Abnormal posture: Secondary | ICD-10-CM | POA: Diagnosis not present

## 2020-05-25 DIAGNOSIS — M6249 Contracture of muscle, multiple sites: Secondary | ICD-10-CM

## 2020-05-25 DIAGNOSIS — M6281 Muscle weakness (generalized): Secondary | ICD-10-CM

## 2020-05-25 DIAGNOSIS — G800 Spastic quadriplegic cerebral palsy: Secondary | ICD-10-CM

## 2020-05-25 NOTE — Therapy (Signed)
[facility] [address] [place], [place], [address] Phone: [phone]   Fax:  [phone]  Physical Therapy Treatment  Patient Details  Name: Christine Gomez MRN: [id number] Date of Birth: Jun 27, 1986 Referring Provider (PT): Everardo Beals   Encounter Date: 05/25/2020  PT End of Session - 05/25/20 1459    Visit Number  5    Number of Visits  11    Authorization Type  Medicaid    PT Start Time  4268    PT Stop Time  1401    PT Time Calculation (min)  46 min    Equipment Utilized During Treatment  Gait belt    Activity Tolerance  Patient tolerated treatment well    Behavior During Therapy  [facility] for tasks assessed/performed       Past Medical History:  Diagnosis Date  . Acid reflux   . Cerebral palsy [facility])     Past Surgical History:  Procedure Laterality Date  . dorsal rhizotomy     back ligament looosening surgery  . ESOPHAGOGASTRODUODENOSCOPY (EGD) WITH PROPOFOL N/A 10/08/2016   Procedure: ESOPHAGOGASTRODUODENOSCOPY (EGD) WITH PROPOFOL;  Surgeon: Arta Silence, MD;  Location: [facility];  Service: Endoscopy;  Laterality: N/A;  . left arm surgery      There were no vitals filed for this visit.  Subjective Assessment - 05/25/20 1318    Subjective  Patient reports doing well. Patient repots that she did a big portion (approx 60%) of her transfer on Wednesday with Vania Rea. Things are going good. No pain. No reports of tightness today. Friend present at session to allow for training on HEP.    Pertinent History  CP, microcytic hyochromic anemia, dorsal rhizotomy 1995, bilateral hip dislocation right after that.    Patient Stated Goals  To be able to transfer easier.    Currently in Pain?  No/denies    Pain Onset  More than a month ago                        [facility] Adult PT Treatment/Exercise - 05/25/20 0001      Bed Mobility   Bed Mobility  Sit to Supine;Supine to Sit    Supine to Sit   Maximal Assistance - Patient - Patient 25-49%    Sit to Supine  Moderate Assistance - Patient 50-74%      Transfers   Transfers  Lateral/Scoot Transfers    Sit to Stand  2: Max assist;With upper extremity assist    Sit to Stand Details (indicate cue type and reason)  attempted to complete sit <> stand with steady, Max A, patient demonstrating ability to bare weight through legs briefly but unable stand erect to allow for proper use of steady at this time. Flexed posture noted in standing.    Lateral/Scoot Transfers  3: Mod assist    Lateral/Scoot Transfer Details (indicate cue type and reason)  w/ sliding board     Comments  With transfers today, patient able to align wheelchair to mat table for transfer.  Worked on increased participation with transfers level surface using a slide board.  Patient demo ability to weight shit sufficiently so slide board could be placed, patient worked on placement of sliding board but demo difficulty due to LUE strength and mobility. Pt able to get partially placed, but require completion with assistance from PT. Patient demo improved ability to transfer going to R side vs. L side at this time. PT providing  verbal cues for improved forward lean for weight shift and improved technique with transfer. Due to contractures/flexed posture difficulty utilzing steady in today's session.       Exercises   Exercises  Other Exercises    Other Exercises   Completed review of entire HEP with patient and friend to allow for proper education of completion at home. PT educating on importance of completion daily or as frequently as possible to allow for more time spent on functional mobility tasks within therapy session.      Completed review of the following exercises included in HEP, with patient and caregiver:   Supine Ankle Dorsiflexion Stretch with Caregiver - 2 x daily - 7 x weekly - 3 sets - 30 hold Supine Hamstring Stretch with Caregiver - 2 x daily - 7 x weekly - 3 sets -  30 hold Hooklying Gluteal Sets - 2 x daily - 7 x weekly - 2 sets - 10 reps Bent Knee Fallouts - 1 x daily - 7 x weekly - 2 sets - 10 reps Hip and Knee Extension and Flexion Caregiver PROM - 2 x daily - 7 x weekly - 1 sets - 10 reps        PT Education - 05/25/20 1458    Education Details  Educated caregiver/friend of patients's on proper completion of HEP    Person(s) Educated  Patient    Methods  Explanation;Demonstration    Comprehension  Verbalized understanding       PT Short Term Goals - 05/09/20 0811      PT SHORT TERM GOAL #1   Title  Pt will be able to perform initial HEP for flexibility/ROM, strengthening with assist of aides for improved mobility.    Baseline  no current HEP    Time  4    Period  Weeks    Status  New    Target Date  06/08/20      PT SHORT TERM GOAL #2   Title  Pt will be able to perform slideboard transfer max assist of 1 person for improved mobility.    Baseline  total assist dependent transfer with her aide picking her up.    Time  4    Period  Weeks    Status  New    Target Date  06/08/20      PT SHORT TERM GOAL #3   Title  Pt will be able to stand in [place] x 2 min to allow weight bearing through legs and improve strength.    Baseline  Max assist to perform partial stand for a couple seconds.    Time  4    Period  Weeks    Status  New    Target Date  06/08/20      PT SHORT TERM GOAL #4   Title  Pt will be able to perform bed mobility mod assist for improved function.    Baseline  unable to assess at eval but per report aides get her in/out of bed.    Time  4    Period  Weeks    Status  New    Target Date  06/08/20        PT Long Term Goals - 05/09/20 0816      PT LONG TERM GOAL #1   Title  Pt will be independent with progress strengthening, flexibility and mobility program for HEP to continue on own.    Baseline  no current HEP    Time  8    Period  Weeks    Status  New    Target Date  07/08/20      PT LONG TERM GOAL #2    Title  Pt will be able to perform slideboard transfer versus squat/pivot mod assist for improved ability to participate with transfers.    Baseline  total assist    Time  8    Period  Weeks    Status  New    Target Date  06/08/20      PT LONG TERM GOAL #3   Title  Pt will be able to stand at counter x 3 min with UE support min assist for improved functional strength and assist with ADLs.    Baseline  Max assist for partial stand a couple seconds.    Time  8    Period  Weeks    Status  New    Target Date  07/08/20      PT LONG TERM GOAL #4   Title  Pt will increase knee extension ROM >5 degrees for improved standing ability.    Baseline  right=-14 and left=-30 degrees passively.    Time  8    Period  Weeks    Status  New    Target Date  07/08/20            Plan - 05/25/20 1509    Clinical Impression Statement  Today's skilled session included education to patient and friend/caregiver on proper completion/techniques of HEP to allow for completion at home. Continued transfer training with sliding board with focus on improved patient participation. Attempted to complete sit <> stand steady but unable to complete due to contractures and flexed posture today. Pt will continue to benefit from skilled PT services to progress toward goals.    Personal Factors and Comorbidities  Comorbidity 2    Comorbidities  microcytic hypochromic anemia, history of dorsal rhizotomy    Examination-Activity Limitations  Transfers;Bed Mobility;Toileting    Examination-Participation Restrictions  Interpersonal Relationship    Stability/Clinical Decision Making  Stable/Uncomplicated    Rehab Potential  Good    PT Frequency  2x / week   followed by 1x/week for 6 weeks, plus eval   PT Duration  2 weeks    PT Treatment/Interventions  ADLs/Self Care Home Management;Functional mobility training;Therapeutic activities;DME Instruction;Neuromuscular re-education;Balance training;Therapeutic exercise;Gait  training;Patient/family education;Manual techniques;Passive range of motion    PT Next Visit Plan  Continue transfer training, continue sitting balance and work on ROM.    Consulted and Agree with Plan of Care  Patient       Patient will benefit from skilled therapeutic intervention in order to improve the following deficits and impairments:  Decreased balance, Decreased mobility, Decreased range of motion, Decreased knowledge of use of DME, Decreased strength, Impaired tone, Impaired flexibility, Postural dysfunction, Impaired UE functional use  Visit Diagnosis: Abnormal posture  Muscle weakness (generalized)  Spastic quadriplegic cerebral palsy (HCC)  Contracture of muscle, multiple sites  Other abnormalities of gait and mobility     Problem List Patient Active Problem List   Diagnosis Date Noted  . Cerebral palsy (HCC) 04/25/2020  . 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 Other acquired deformity of other parts of limb 04/25/2020  . 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 Unspecified deformity of forearm, excluding fingers 04/25/2020  . Urinary incontinence 04/25/2020  . Iron deficiency anemia due to chronic blood loss 07/10/2017  Tempie Donning, PT, DPT 05/25/2020, 3:16 PM  [facility] [address] [place], [place], [address] Phone: [phone]   Fax:  [phone]  Name: Christine Gomez MRN: [id number] Date of Birth: Mar 02, 1986

## 2020-05-30 ENCOUNTER — Ambulatory Visit: Payer: Medicaid Other | Attending: *Deleted | Admitting: Occupational Therapy

## 2020-05-30 ENCOUNTER — Encounter: Payer: Self-pay | Admitting: Occupational Therapy

## 2020-05-30 ENCOUNTER — Other Ambulatory Visit: Payer: Self-pay

## 2020-05-30 DIAGNOSIS — M6281 Muscle weakness (generalized): Secondary | ICD-10-CM | POA: Diagnosis present

## 2020-05-30 DIAGNOSIS — R2689 Other abnormalities of gait and mobility: Secondary | ICD-10-CM | POA: Diagnosis present

## 2020-05-30 DIAGNOSIS — G800 Spastic quadriplegic cerebral palsy: Secondary | ICD-10-CM | POA: Diagnosis present

## 2020-05-30 DIAGNOSIS — R293 Abnormal posture: Secondary | ICD-10-CM | POA: Insufficient documentation

## 2020-05-30 DIAGNOSIS — M6249 Contracture of muscle, multiple sites: Secondary | ICD-10-CM | POA: Diagnosis present

## 2020-05-30 DIAGNOSIS — R29818 Other symptoms and signs involving the nervous system: Secondary | ICD-10-CM | POA: Insufficient documentation

## 2020-05-30 NOTE — Therapy (Signed)
[facility] [address] [place], [place], [address] Phone: [phone]   Fax:  [phone]  Occupational Therapy Treatment  Patient Details  Name: Christine Gomez MRN: [id number] Date of Birth: [birth year] Referring Provider (OT): Everardo Beals, NP   Encounter Date: 05/30/2020  OT End of Session - 05/30/20 1655    Visit Number  4    Number of Visits  12    Date for OT Re-Evaluation  07/08/20    Authorization Type  3 OT visits approved 5/21-6/10    Authorization Time Period  06/07/20    Authorization - Visit Number  3    Authorization - Number of Visits  3    Progress Note Due on Visit  3    OT Start Time  1402    OT Stop Time  1445    OT Time Calculation (min)  43 min    Activity Tolerance  Patient tolerated treatment well    Behavior During Therapy  [facility] for tasks assessed/performed       Past Medical History:  Diagnosis Date  . Acid reflux   . Cerebral palsy [facility])     Past Surgical History:  Procedure Laterality Date  . dorsal rhizotomy     back ligament looosening surgery  . ESOPHAGOGASTRODUODENOSCOPY (EGD) WITH PROPOFOL N/A 10/08/2016   Procedure: ESOPHAGOGASTRODUODENOSCOPY (EGD) WITH PROPOFOL;  Surgeon: Arta Silence, MD;  [facility]: [facility];  Service: Endoscopy;  Laterality: N/A;  . left arm surgery      There were no vitals filed for this visit.  Subjective Assessment - 05/30/20 1644    Subjective   I have been washing myself every day!    Currently in Pain?  No/denies    Pain Score  0-No pain                   OT Treatments/Exercises (OP) - 05/30/20 0001      ADLs   Eating  Discussed reaching out to prior social support network ([employer]) to inquire about meals on wheels or back up support when regular caregiver not available.      UB Dressing  Simulated upper body dressing and undressing in unsupported sitting.   Patient to practice while seated edge of bed where she has  more opportunity to weight shift (versus in chair)      LB Dressing  In sitting- worked on dynamic balance as needed for bathing and dressing tasks.  Patient able to flex forward in sitting and reach shoelace (R) with intermittent min assist.  Patient fearful of faling with forward weight shifts.      Functional Mobility  Level surface transfer from w/c to mat table.  Patient able to bring hips forward in chair (by pushing shoulders into backrest) and weight shift forward sufficienty to unweight seat.  Patient able to lift off slightly, neing assistance to weight shift right or left.  Patient while seated on mat, able to laterally lean left to place slide board under right hips with mod assist for accurate placement.               OT Education - 05/30/20 1654    Education Details  Unsupported upper body dressing techniques, resources for meals on wheels, back up aide service    Methods  Explanation    Comprehension  Verbalized understanding       OT Short Term Goals - 05/30/20 1701      OT SHORT TERM GOAL #1  Title  Pt to verbalize understanding with potential A/E needs    Status  On-going      OT SHORT TERM GOAL #2   Title  Pt to verbalize understanding with HEP    Status  Achieved        OT Long Term Goals - 05/30/20 1701      OT LONG TERM GOAL #1   Title  Pt to perform UB dressing and bathing with mod assist and A/E prn    Status  On-going      OT LONG TERM GOAL #2   Title  Pt to perform dynamic sitting EOB with close supervision in prep for seated ADLS    Status  Achieved      OT LONG TERM GOAL #3   Title  Pt to perform sliding board transfers with max assist    Status  On-going            Plan - 05/30/20 1656    Clinical Impression Statement  Patient showing steady progress with her functional mobility and increased participation in ADL    OT Frequency  1x / week    OT Duration  --   1x/wk - 3weeks, then 2x/week for 4 weeks   OT Treatment/Interventions   Self-care/ADL training;Patient/family education;Therapeutic exercise;Balance training;Therapeutic activities;Functional Mobility Training    Plan  Discuss A/E options (for cutting food, chopping, bathing, dressing)  review upper body dressing unsupported    OT Home Exercise Plan  Home activity program to begin partial bathing, assist with slide board transfers    Consulted and Agree with Plan of Care  Patient       Patient will benefit from skilled therapeutic intervention in order to improve the following deficits and impairments:           Visit Diagnosis: Abnormal posture  Muscle weakness (generalized)  Spastic quadriplegic cerebral palsy (HCC)  Other symptoms and signs involving the nervous system    Problem List Patient Active Problem List   Diagnosis Date Noted  . Cerebral palsy (HCC) 04/25/2020  . 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 Other acquired deformity of other parts of limb 04/25/2020  . 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 Unspecified deformity of forearm, excluding fingers 04/25/2020  . Urinary incontinence 04/25/2020  . Iron deficiency anemia due to chronic blood loss 07/10/2017    Collier Salina, OTR/L 05/30/2020, 5:02 PM  [facility] [address] [place], [place], [address] Phone: [phone]   Fax:  [phone]  Name: Christine Gomez MRN: [id number] Date of Birth: Sep 05, 1986

## 2020-05-31 ENCOUNTER — Ambulatory Visit: Payer: Medicaid Other

## 2020-05-31 DIAGNOSIS — R293 Abnormal posture: Secondary | ICD-10-CM

## 2020-05-31 DIAGNOSIS — R2689 Other abnormalities of gait and mobility: Secondary | ICD-10-CM

## 2020-05-31 DIAGNOSIS — M6281 Muscle weakness (generalized): Secondary | ICD-10-CM

## 2020-05-31 NOTE — Therapy (Signed)
[place] [address] [place], [place], [address] Phone: [phone]   Fax:  [phone]  Physical Therapy Treatment  Patient Details  Name: Christine Gomez MRN: [id number] Date of Birth: 1986/10/18 Referring Provider (PT): Everardo Beals   Encounter Date: 05/31/2020  PT End of Session - 05/31/20 1543    Visit Number  6    Number of Visits  11    Authorization Type  Medicaid    PT Start Time  3875   patient arrived late to session   PT Stop Time  1615    PT Time Calculation (min)  36 min    Equipment Utilized During Treatment  Gait belt    Activity Tolerance  Patient tolerated treatment well    Behavior During Therapy  [facility] for tasks assessed/performed       Past Medical History:  Diagnosis Date  . Acid reflux   . Cerebral palsy [facility])     Past Surgical History:  Procedure Laterality Date  . dorsal rhizotomy     back ligament looosening surgery  . ESOPHAGOGASTRODUODENOSCOPY (EGD) WITH PROPOFOL N/A 10/08/2016   Procedure: ESOPHAGOGASTRODUODENOSCOPY (EGD) WITH PROPOFOL;  Surgeon: Arta Silence, MD;  Location: [facility];  Service: Endoscopy;  Laterality: N/A;  . left arm surgery      There were no vitals filed for this visit.  Subjective Assessment - 05/31/20 1541    Subjective  Patient reports that she has been doing stretching program with friend, 3-4 days week right now based upon when there schedule works. No new complaints. Reports stiffness but believes it is due to the weather.    Pertinent History  CP, microcytic hyochromic anemia, dorsal rhizotomy 1995, bilateral hip dislocation right after that.    Patient Stated Goals  To be able to transfer easier.    Currently in Pain?  No/denies    Pain Onset  More than a month ago                        [facility] Adult PT Treatment/Exercise - 05/31/20 0001      Transfers   Transfers  Sit to Stand;Stand to Sit    Sit to Stand  2: Max  assist   +2 w/ assistance from PT Tech   Sit to Stand Details  Tactile cues for weight shifting;Tactile cues for posture;Verbal cues for technique;Manual facilitation for weight shifting    Sit to Stand Details (indicate cue type and reason)  completed sit <> stand from w/c in // bars, max assist. x 5 reps. Patient utilizing RUE on // bars. Verbal cues for upright posture and PT providing manual faciliation and tactile cues for improved upright during standing.        Ambulation/Gait   Pre-Gait Activities  Patient able to stand max A in // bars, 5 reps x 20-30 secs each before requiring rest break. In standing patietn demo flexed posture due to contractures at this time. PT providing manual faciliation for improved standing posture, and verbal cues for technique. Patient require extended seated rest breaks between standing due to decreased endurance and strength.       Exercises   Exercises  Other Exercises    Other Exercises   PT providing manual stretching to BLE in today's session. Compelted hamstring stretch 2 x 1 min BLE, gastroc stretch 2 x 1 min each BLE. With patients lower extremity propped onto PT's leg for support, worked on quad set 1  x 10 reps on BLE. Focus on improved knee extension with completion. All activties completed seated in w/c.                PT Short Term Goals - 05/09/20 3500      PT SHORT TERM GOAL #1   Title  Pt will be able to perform initial HEP for flexibility/ROM, strengthening with assist of aides for improved mobility.    Baseline  no current HEP    Time  4    Period  Weeks    Status  New    Target Date  06/08/20      PT SHORT TERM GOAL #2   Title  Pt will be able to perform slideboard transfer max assist of 1 person for improved mobility.    Baseline  total assist dependent transfer with her aide picking her up.    Time  4    Period  Weeks    Status  New    Target Date  06/08/20      PT SHORT TERM GOAL #3   Title  Pt will be able to stand in  Stedy x 2 min to allow weight bearing through legs and improve strength.    Baseline  Max assist to perform partial stand for a couple seconds.    Time  4    Period  Weeks    Status  New    Target Date  06/08/20      PT SHORT TERM GOAL #4   Title  Pt will be able to perform bed mobility mod assist for improved function.    Baseline  unable to assess at eval but per report aides get her in/out of bed.    Time  4    Period  Weeks    Status  New    Target Date  06/08/20        PT Long Term Goals - 05/09/20 0816      PT LONG TERM GOAL #1   Title  Pt will be independent with progress strengthening, flexibility and mobility program for HEP to continue on own.    Baseline  no current HEP    Time  8    Period  Weeks    Status  New    Target Date  07/08/20      PT LONG TERM GOAL #2   Title  Pt will be able to perform slideboard transfer versus squat/pivot mod assist for improved ability to participate with transfers.    Baseline  total assist    Time  8    Period  Weeks    Status  New    Target Date  06/08/20      PT LONG TERM GOAL #3   Title  Pt will be able to stand at counter x 3 min with UE support min assist for improved functional strength and assist with ADLs.    Baseline  Max assist for partial stand a couple seconds.    Time  8    Period  Weeks    Status  New    Target Date  07/08/20      PT LONG TERM GOAL #4   Title  Pt will increase knee extension ROM >5 degrees for improved standing ability.    Baseline  right=-14 and left=-30 degrees passively.    Time  8    Period  Weeks    Status  New    Target Date  07/08/20            Plan - 05/31/20 1721    Clinical Impression Statement  Completed sit <> stand training and pre gait standing tolerance in parallel bars today, with Max A. Patient able to tolerate standing for approx 20-30 seconds prior to needing rest break. Flexed posture noted upon standing. Followed with continue BLE strengthening and stretching to  patient's tolerance. Will continue to progress as tolerated by patient, and patient will contiue to benefit from skilled PT services to address deficits and progress toward goals.    Personal Factors and Comorbidities  Comorbidity 2    Comorbidities  microcytic hypochromic anemia, history of dorsal rhizotomy    Examination-Activity Limitations  Transfers;Bed Mobility;Toileting    Examination-Participation Restrictions  Interpersonal Relationship    Stability/Clinical Decision Making  Stable/Uncomplicated    Rehab Potential  Good    PT Frequency  2x / week   followed by 1x/week for 6 weeks, plus eval   PT Duration  2 weeks    PT Treatment/Interventions  ADLs/Self Care Home Management;Functional mobility training;Therapeutic activities;DME Instruction;Neuromuscular re-education;Balance training;Therapeutic exercise;Gait training;Patient/family education;Manual techniques;Passive range of motion    PT Next Visit Plan  Continue transfer training, continue sitting balance. Standing in // bars?    Consulted and Agree with Plan of Care  Patient       Patient will benefit from skilled therapeutic intervention in order to improve the following deficits and impairments:  Decreased balance, Decreased mobility, Decreased range of motion, Decreased knowledge of use of DME, Decreased strength, Impaired tone, Impaired flexibility, Postural dysfunction, Impaired UE functional use  Visit Diagnosis: Abnormal posture  Muscle weakness (generalized)  Other abnormalities of gait and mobility     Problem List Patient Active Problem List   Diagnosis Date Noted  . Cerebral palsy ([facility]) 04/25/2020  . 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 Other acquired deformity of other parts of limb 04/25/2020  . 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 Unspecified deformity of forearm, excluding fingers 04/25/2020  . Urinary incontinence  04/25/2020  . Iron deficiency anemia due to chronic blood loss 07/10/2017    Tempie Donning, PT, DPT 05/31/2020, 5:24 PM  [facility] [address] [place], [place], [address] Phone: [phone]   Fax:  [phone]  Name: Christine Gomez MRN: [id number] Date of Birth: 01/29/1986

## 2020-06-04 ENCOUNTER — Ambulatory Visit: Payer: Medicaid Other

## 2020-06-04 ENCOUNTER — Other Ambulatory Visit: Payer: Self-pay

## 2020-06-04 ENCOUNTER — Ambulatory Visit: Payer: Medicaid Other | Admitting: Occupational Therapy

## 2020-06-04 ENCOUNTER — Encounter: Payer: Self-pay | Admitting: Occupational Therapy

## 2020-06-04 DIAGNOSIS — R293 Abnormal posture: Secondary | ICD-10-CM | POA: Diagnosis not present

## 2020-06-04 DIAGNOSIS — G800 Spastic quadriplegic cerebral palsy: Secondary | ICD-10-CM

## 2020-06-04 DIAGNOSIS — M6281 Muscle weakness (generalized): Secondary | ICD-10-CM

## 2020-06-04 DIAGNOSIS — R29818 Other symptoms and signs involving the nervous system: Secondary | ICD-10-CM

## 2020-06-04 DIAGNOSIS — R2689 Other abnormalities of gait and mobility: Secondary | ICD-10-CM

## 2020-06-04 NOTE — Therapy (Signed)
[facility] [address] [place], [place], [address] Phone: [phone]   Fax:  [phone]  Physical Therapy Treatment  Patient Details  Name: Christine Gomez MRN: [id number] Date of Birth: 01/06/1986 Referring Provider (PT): Marva Panda   Encounter Date: 06/04/2020  PT End of Session - 06/04/20 1515    Visit Number  7    Number of Visits  11    Authorization Type  Medicaid    Authorization Time Period  10 Visits (05/17/2020 - 07/11/2020    Authorization - Visit Number  6    Authorization - Number of Visits  10    Progress Note Due on Visit  10    PT Start Time  1400    PT Stop Time  1445    PT Time Calculation (min)  45 min    Equipment Utilized During Treatment  Gait belt    Activity Tolerance  Patient tolerated treatment well    Behavior During Therapy  WFL for tasks assessed/performed       Past Medical History:  Diagnosis Date  . Acid reflux   . Cerebral palsy [facility])     Past Surgical History:  Procedure Laterality Date  . dorsal rhizotomy     back ligament looosening surgery  . ESOPHAGOGASTRODUODENOSCOPY (EGD) WITH PROPOFOL N/A 10/08/2016   Procedure: ESOPHAGOGASTRODUODENOSCOPY (EGD) WITH PROPOFOL;  Surgeon: Willis Modena, MD;  [facility]: [facility];  Service: Endoscopy;  Laterality: N/A;  . left arm surgery      There were no vitals filed for this visit.  Subjective Assessment - 06/04/20 1406    Subjective  Patient reports doing well, stretching is going well. Friend is coming over today to stretch. Patient reports stiffness today.    Pertinent History  CP, microcytic hyochromic anemia, dorsal rhizotomy 1995, bilateral hip dislocation right after that.    Patient Stated Goals  To be able to transfer easier.    Currently in Pain?  No/denies    Pain Onset  More than a month ago                        [facility] Adult PT Treatment/Exercise - 06/04/20 0001      Bed Mobility   Bed  Mobility  Sit to Supine;Supine to Sit;Rolling Right    Rolling Right  Minimal Assistance - Patient > 75%    Supine to Sit  Maximal Assistance - Patient - Patient 25-49%    Sit to Supine  Moderate Assistance - Patient 50-74%      Transfers   Transfers  Sit to Stand;Stand to Sit    Sit to Stand  2: Max assist    Sit to Stand Details  Tactile cues for weight shifting;Tactile cues for posture;Verbal cues for technique;Manual facilitation for weight shifting    Sit to Stand Details (indicate cue type and reason)  sit <> stand from w/c in // bars, Max A. Continued verbal and tactile cues for upright posture, and manual faciliation to pelvis. x 3 reps.     Lateral/Scoot Transfers  3: Mod assist    Lateral/Scoot Transfer Details (indicate cue type and reason)  w/ sliding board. Pt require Mod A for placement of sliding board.     Transfer via [employer] loss [employer]er     Ambulation/Gait   Pre-Gait Activities  Completed standing tolerance in // bars, Max A x 3 reps. Patient able to stand with Max  A +2, for 52 secs, 64 secs, and 32 secs. Extended rest breaks required between. Focus on standing upright with proper posture, still demo flexed posture and contractures in BLE.              PT Education - 06/04/20 1515    Education Details  Educated on progress toward Goals    Person(s) Educated  Patient    Methods  Explanation    Comprehension  Verbalized understanding       PT Short Term Goals - 06/04/20 1517      PT SHORT TERM GOAL #1   Title  Pt will be able to perform initial HEP for flexibility/ROM, strengthening with assist of aides for improved mobility.    Baseline  Reports completing HEP with assist of aide/friend 3-4 times week    Time  4    Period  Weeks    Status  On-going    Target Date  06/08/20      PT SHORT TERM GOAL #2   Title  Pt will be able to perform slideboard transfer max assist of 1 person for improved mobility.    Baseline  Slideboard  transfer Mod A,    Time  4    Period  Weeks    Status  Achieved    Target Date  06/08/20      PT SHORT TERM GOAL #3   Title  Pt will be able to stand in Stedy x 2 min to allow weight bearing through legs and improve strength.    Baseline  Able to stand in // bars for 1 min with Max A +2    Time  4    Period  Weeks    Status  On-going    Target Date  06/08/20      PT SHORT TERM GOAL #4   Title  Pt will be able to perform bed mobility mod assist for improved function.    Baseline  Patient still require Max A for supine to sit    Time  4    Period  Weeks    Status  On-going    Target Date  06/08/20        PT Long Term Goals - 05/09/20 0816      PT LONG TERM GOAL #1   Title  Pt will be independent with progress strengthening, flexibility and mobility program for HEP to continue on own.    Baseline  no current HEP    Time  8    Period  Weeks    Status  New    Target Date  07/08/20      PT LONG TERM GOAL #2   Title  Pt will be able to perform slideboard transfer versus squat/pivot mod assist for improved ability to participate with transfers.    Baseline  total assist    Time  8    Period  Weeks    Status  New    Target Date  06/08/20      PT LONG TERM GOAL #3   Title  Pt will be able to stand at counter x 3 min with UE support min assist for improved functional strength and assist with ADLs.    Baseline  Max assist for partial stand a couple seconds.    Time  8    Period  Weeks    Status  New    Target Date  07/08/20      PT LONG TERM GOAL #  4   Title  Pt will increase knee extension ROM >5 degrees for improved standing ability.    Baseline  right=-14 and left=-30 degrees passively.    Time  8    Period  Weeks    Status  New    Target Date  07/08/20            Plan - 06/04/20 1521    Clinical Impression Statement  Today's skilled PT session included assessment of patient's progress toward STG's, patient meeting STG #2 demo ability to complete slide board  transfer with Mod A at this time. Patient is demo progress to all other STG and LTGs at this time. Progressed standing tolerance in // bars, with PT still providing Max A and faciliation for upright posture and neutral alignment due to flexed posture. Patient will continue to benefit from skilled PT services to address deficits and progress toward all unmet goals.    Personal Factors and Comorbidities  Comorbidity 2    Comorbidities  microcytic hypochromic anemia, history of dorsal rhizotomy    Examination-Activity Limitations  Transfers;Bed Mobility;Toileting    Examination-Participation Restrictions  Interpersonal Relationship    Stability/Clinical Decision Making  Stable/Uncomplicated    Rehab Potential  Good    PT Frequency  2x / week   followed by 1x/week for 6 weeks, plus eval   PT Duration  2 weeks    PT Treatment/Interventions  ADLs/Self Care Home Management;Functional mobility training;Therapeutic activities;DME Instruction;Neuromuscular re-education;Balance training;Therapeutic exercise;Gait training;Patient/family education;Manual techniques;Passive range of motion    PT Next Visit Plan  Continue transfer training, continue sitting balance. Standing in // bars?    Consulted and Agree with Plan of Care  Patient       Patient will benefit from skilled therapeutic intervention in order to improve the following deficits and impairments:  Decreased balance, Decreased mobility, Decreased range of motion, Decreased knowledge of use of DME, Decreased strength, Impaired tone, Impaired flexibility, Postural dysfunction, Impaired UE functional use  Visit Diagnosis: Abnormal posture  Muscle weakness (generalized)  Other abnormalities of gait and mobility     Problem List Patient Active Problem List   Diagnosis Date Noted  . Cerebral palsy ([place]) 04/25/2020  . 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 Other acquired deformity of other parts of limb 04/25/2020  . Pathological  dislocation of pelvic region and thigh joint 04/25/2020  . 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 Unspecified deformity of forearm, excluding fingers 04/25/2020  . Urinary incontinence 04/25/2020  . Iron deficiency anemia due to chronic blood loss 07/10/2017    Jones Bales, PT, DPT 06/04/2020, 3:22 PM  [place] [address] [place], [place], [address] Phone: [phone]   Fax:  [phone]  Name: Christine Gomez MRN: [id number] Date of Birth: [birth year]
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
## 2020-06-06 ENCOUNTER — Ambulatory Visit: Payer: Medicaid Other | Admitting: Occupational Therapy

## 2020-06-11 ENCOUNTER — Ambulatory Visit: Payer: Medicaid Other

## 2020-06-11 ENCOUNTER — Other Ambulatory Visit: Payer: Self-pay

## 2020-06-11 ENCOUNTER — Ambulatory Visit: Payer: Medicaid Other | Admitting: Occupational Therapy

## 2020-06-11 DIAGNOSIS — M6249 Contracture of muscle, multiple sites: Secondary | ICD-10-CM

## 2020-06-11 DIAGNOSIS — R2689 Other abnormalities of gait and mobility: Secondary | ICD-10-CM

## 2020-06-11 DIAGNOSIS — R293 Abnormal posture: Secondary | ICD-10-CM | POA: Diagnosis not present

## 2020-06-11 DIAGNOSIS — M6281 Muscle weakness (generalized): Secondary | ICD-10-CM

## 2020-06-11 DIAGNOSIS — R29818 Other symptoms and signs involving the nervous system: Secondary | ICD-10-CM

## 2020-06-11 NOTE — Therapy (Signed)
[facility] [address] [place], [place], [address] Phone: [phone]   Fax:  [phone]  Physical Therapy Treatment  Patient Details  Name: Christine Gomez MRN: [id number] Date of Birth: [birth year] Referring Provider (PT): Marva Panda   Encounter Date: 06/11/2020   PT End of Session - 06/11/20 1650    Visit Number 8    Number of Visits 11    Authorization Type Medicaid    Authorization Time Period 10 Visits (05/17/2020 - 07/11/2020)    Authorization - Visit Number 7    Authorization - Number of Visits 10    Progress Note Due on Visit 10    PT Start Time 1402    PT Stop Time 1446    PT Time Calculation (min) 44 min    Equipment Utilized During Treatment Gait belt    Activity Tolerance Patient tolerated treatment well    Behavior During Therapy WFL for tasks assessed/performed           Past Medical History:  Diagnosis Date  . Acid reflux   . Cerebral palsy [facility])     Past Surgical History:  Procedure Laterality Date  . dorsal rhizotomy     back ligament looosening surgery  . ESOPHAGOGASTRODUODENOSCOPY (EGD) WITH PROPOFOL N/A 10/08/2016   Procedure: ESOPHAGOGASTRODUODENOSCOPY (EGD) WITH PROPOFOL;  Surgeon: Willis Modena, MD;  Location: [facility];  Service: Endoscopy;  Laterality: N/A;  . left arm surgery      There were no vitals filed for this visit.   Subjective Assessment - 06/11/20 1407    Subjective Patient stretching is going well. Patient reports stiffness in lower back today.    Pertinent History CP, microcytic hyochromic anemia, dorsal rhizotomy 1995, bilateral hip dislocation right after that.    Patient Stated Goals To be able to transfer easier.    Currently in Pain? No/denies    Pain Onset More than a month ago                             [facility] Adult PT Treatment/Exercise - 06/11/20 0001      Transfers   Lateral/Scoot Transfers 3: Mod assist;2: Max assist      Lateral/Scoot Transfer Details (indicate cue type and reason) Completed transfer from w/c <> mat with sliding board. Patient continue to practice placing sliding board, but require assistance from PT to get it placed enough under thigh/buttocks to allow for proper transfer. Patient require max A from w/c <> mat transfer, but only require Mod A with mat <> w/c transfer. PT continue to provide verbal and tactile cues for forward lean, technique, and hand placement during transfer.     Transfer via [employer] fast streamer Other Self-Care Comments    Other Self-Care Comments  Patient asked about a handout on sliding board transfers to allow for family members/caregivers to read/have readily available. Pt reports wanting to obtain a sliding board transfer and complete at home. PT reprting it would be beneficial for caregiver to come in and practice with PT/OT to allow for further training.       Therapeutic Activites    Therapeutic Activities Other Therapeutic Activities    Other Therapeutic Activities Seated on edge of mat with 6" block placed under BLE completed core control and strengthening activities. Pt able to sit w/o back support x 5-10 minutes. Completed modified sit ups with  wedge +2 pillow placed behind working on slow lowering and going back into neutral sitting, 3 x 5 reps. Also completed R/L lateral trunk flexion 2 x 5 each direction, also working on using core to come back up to sitting in neutral alignment. PT providing CGA as needed, overall pt demo good stability.       Exercises   Exercises Other Exercises    Other Exercises  In w/c with back support, completed alternating marching 2 x 5 reps on BLE. Due to patient reports of wanting to do more activities at home, attempted to complete stationary exercise peddler while in w/c, however pt demo difficulty maintaining foot placement in device w/o assistance.                   PT  Education - 06/11/20 1656    Education Details Educated on sliding board transfer handout (see patient instructions)    Person(s) Educated Patient    Methods Explanation    Comprehension Verbalized understanding            PT Short Term Goals - 06/04/20 1517      PT SHORT TERM GOAL #1   Title Pt will be able to perform initial HEP for flexibility/ROM, strengthening with assist of aides for improved mobility.    Baseline Reports completing HEP with assist of aide/friend 3-4 times week    Time 4    Period Weeks    Status On-going    Target Date 06/08/20      PT SHORT TERM GOAL #2   Title Pt will be able to perform slideboard transfer max assist of 1 person for improved mobility.    Baseline Slideboard transfer Mod A,    Time 4    Period Weeks    Status Achieved    Target Date 06/08/20      PT SHORT TERM GOAL #3   Title Pt will be able to stand in [place] x 2 min to allow weight bearing through legs and improve strength.    Baseline Able to stand in // bars for 1 min with Max A +2    Time 4    Period Weeks    Status On-going    Target Date 06/08/20      PT SHORT TERM GOAL #4   Title Pt will be able to perform bed mobility mod assist for improved function.    Baseline Patient still require Max A for supine to sit    Time 4    Period Weeks    Status On-going    Target Date 06/08/20             PT Long Term Goals - 05/09/20 0816      PT LONG TERM GOAL #1   Title Pt will be independent with progress strengthening, flexibility and mobility program for HEP to continue on own.    Baseline no current HEP    Time 8    Period Weeks    Status New    Target Date 07/08/20      PT LONG TERM GOAL #2   Title Pt will be able to perform slideboard transfer versus squat/pivot mod assist for improved ability to participate with transfers.    Baseline total assist    Time 8    Period Weeks    Status New    Target Date 06/08/20      PT LONG TERM GOAL #3   Title Pt will be  able  to stand at counter x 3 min with UE support min assist for improved functional strength and assist with ADLs.    Baseline Max assist for partial stand a couple seconds.    Time 8    Period Weeks    Status New    Target Date 07/08/20      PT LONG TERM GOAL #4   Title Pt will increase knee extension ROM >5 degrees for improved standing ability.    Baseline right=-14 and left=-30 degrees passively.    Time 8    Period Weeks    Status New    Target Date 07/08/20                 Plan - 06/11/20 1653    Clinical Impression Statement Today's skilled PT session focused on continuining transfer training with sliding board, and seated activties on mat focused on proper weight shifting, core strength and seated balance to allow for improved transfer training. PT also educating patient on handout regarding transfer training with board, encouraged caregiver to come in and practice sliding board transfer with patient if plan to get this device for home. Pt will continue to benefit from skilled PT services to address deficits and progress toward goals.    Personal Factors and Comorbidities Comorbidity 2    Comorbidities microcytic hypochromic anemia, history of dorsal rhizotomy    Examination-Activity Limitations Transfers;Bed Mobility;Toileting    Examination-Participation Restrictions Interpersonal Relationship    Stability/Clinical Decision Making Stable/Uncomplicated    Rehab Potential Good    PT Frequency 2x / week   followed by 1x/week for 6 weeks, plus eval   PT Duration 2 weeks    PT Treatment/Interventions ADLs/Self Care Home Management;Functional mobility training;Therapeutic activities;DME Instruction;Neuromuscular re-education;Balance training;Therapeutic exercise;Gait training;Patient/family education;Manual techniques;Passive range of motion    PT Next Visit Plan Continue transfer training, continue sitting balance. Standing in // bars?    Consulted and Agree with Plan of Care  Patient           Patient will benefit from skilled therapeutic intervention in order to improve the following deficits and impairments:  Decreased balance, Decreased mobility, Decreased range of motion, Decreased knowledge of use of DME, Decreased strength, Impaired tone, Impaired flexibility, Postural dysfunction, Impaired UE functional use  Visit Diagnosis: Abnormal posture  Muscle weakness (generalized)  Other symptoms and signs involving the nervous system  Other abnormalities of gait and mobility  Contracture of muscle, multiple sites     Problem List Patient Active Problem List   Diagnosis Date Noted  . Cerebral palsy ([place]) 04/25/2020  . 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 Other acquired deformity of other parts of limb 04/25/2020  . 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 Unspecified deformity of forearm, excluding fingers 04/25/2020  . Urinary incontinence 04/25/2020  . Iron deficiency anemia due to chronic blood loss 07/10/2017    Jones Bales, PT, DPT 06/11/2020, 4:56 PM  [place] [address] [place], [place], [address] Phone: [phone]   Fax:  [phone]  Name: Christine Gomez MRN: [id number] Date of Birth: 07/19/1986

## 2020-06-11 NOTE — Patient Instructions (Signed)
How to Use a Transfer Board A transfer board is a smooth, rectangular board. It is used to help a person get from one seat or bed to another if he or she has limited ability to move. What are the risks? Using a transfer board is generally safe for you to do at home. However, problems may occur if the transfer board is not used correctly, such as:  Falls.  Skin tear (abrasion) from rubbing.  Injury to the helper's back. Supplies needed:  Fish farm manager.  Gait belt.  Towel or transfer pad.  Nonslip shoes. These should be worn by the person and his or her helpers. Tips   Do not use a transfer board with a person who is drowsy and cannot sit up. A transfer board should be used only with a person who can follow directions, has upper body strength, and is able to sit.  Have a second helper whenever possible. While you give help in the front, the second helper should give support from behind and help with lifting.  The person and his or her helpers should wear nonslip shoes.  Offer only as much help as the person needs. Allow him or her to help as much as he or she can.  Always tell the person being moved what you will be doing next.  For any transfer, put a gait belt around the person's waist and close it securely.  Avoid rubbing skin along the board.  Cover the board with a towel or a transfer pad, if needed.  Be sure that both buttocks lift off the chair, bed, or board for each scoot. How to transfer a person from a bed to a chair Preparing  1. Position the chair near the bed so it is side-by-side (parallel) to the bed. If the chair is a wheelchair: ? Remove the footrests. ? Raise or remove the armrest that is near the bed. ? Use the brakes to lock the wheels. 2. If the bed is adjustable, raise it so the top of the bed is a little higher than the seat of the chair. 3. Have the person sit at the edge of the bed. 4. Use a towel or a transfer pad if needed. Place it under the  person's torso and buttocks to help with the transfer. Moving  1. Ensure both feet are firmly on floor. 2. Lean the person sideways to raise the buttock that is near the chair. 3. Slide a third of the board under the raised buttock. 4. Place the other end of the board on the chair so it reaches the middle of the chair. 5. Put your leg that is near the bed between the person's legs. Put your other leg closer to the chair. 6. Ask the person to push down with his or her hands to briefly lift both buttocks slightly off the bed. ? Make sure the person does not grab the ends of the board. His or her hands could get pinched under the board. 7. Have the person lean forward and hug the helper in front while a helper at the back guides the person's hips. Help the person to scoot a couple of times across the board and into the chair. 8. Remove your hands once the person is stable in the chair. 9. Stand in front of the person and lean him or her to the side to raise the buttock off the board. Remove the board. 10. If a towel or pad is used, smooth it out.  It can remain under the person in the chair. 11. If the chair is a wheelchair, put the armrest and footrests back in place. 12. Remove the gait belt once the person is secure in the chair. How to transfer a person from a chair to a bed Preparing 1. Position the chair near the bed. If the chair is a wheelchair: ? Remove the footrests. ? Raise or remove the armrest that is near the bed. ? Use the brakes to lock the wheels. 2. If the bed is adjustable, lower it so the top of the bed is even with or a little lower than the seat of the chair. Moving 1. Ensure both feet are firmly on the floor. 2. Lean the person sideways to raise the buttock that is near the bed. 3. Slide a third of the board under the raised buttock. The board should bridge the gap between the chair and the bed. 4. Have the person lean forward and hug the helper in front while a helper  at the back guides the person's hips. Help the person to scoot a couple of times across the board and onto the bed. 5. The person should be sitting up with both legs over the edge of the bed. 6. Stand in front of the person and lean him or her to the side to raise the buttock off the board. Remove the board. 7. If needed, position the towel or pad on the bed and smooth it out. It can remain under the person in bed. 8. Roll the person over and onto the towel or pad in a comfortable position. 9. Remove the gait belt once the person is secure in the bed. How to transfer a person from a chair to another chair Preparing 1. Position the chairs so they are parallel. If one chair is a wheelchair: ? Remove the footrests. ? Raise or remove the armrest that is between the chairs. ? Use the brakes to lock the wheels. 2. Position the empty chair to be even with or a little lower than the chair that the person is in. Moving 1. Ensure both feet are firmly on the floor. 2. Lean the person sideways to raise the buttock that is near the empty chair. 3. Slide a third of the covered board under the raised buttock. The board should bridge the gap between the two chairs. 4. Have the person lean forward and hug the helper in front while a helper at the back guides the person's hips. Help the person to scoot a couple of times across the board and into the empty chair. 5. Stand in front of the person and lean him or her to the side to raise the buttock off the board. Remove the board. 6. If the person is sitting in a wheelchair now, put the armrests and footrests back in place. 7. Remove the gait belt once the person is secure in the chair. Summary  A transfer board is a smooth, rectangular board. It is used to help a person get from one seat or bed to another.  Use the transfer board correctly to avoid falls, skin problems, and back injury.  For any transfer, put a gait belt around the person's waist and close it  securely. This information is not intended to replace advice given to you by your health care provider. Make sure you discuss any questions you have with your health care provider. Document Revised: 01/18/2019 Document Reviewed: 01/19/2019 Elsevier Patient Education  2020 [employer].

## 2020-06-13 ENCOUNTER — Other Ambulatory Visit: Payer: Self-pay

## 2020-06-13 ENCOUNTER — Ambulatory Visit: Payer: Medicaid Other | Admitting: Occupational Therapy

## 2020-06-13 ENCOUNTER — Encounter: Payer: Self-pay | Admitting: Occupational Therapy

## 2020-06-13 DIAGNOSIS — R293 Abnormal posture: Secondary | ICD-10-CM

## 2020-06-13 DIAGNOSIS — G800 Spastic quadriplegic cerebral palsy: Secondary | ICD-10-CM

## 2020-06-13 DIAGNOSIS — R29818 Other symptoms and signs involving the nervous system: Secondary | ICD-10-CM

## 2020-06-13 DIAGNOSIS — M6281 Muscle weakness (generalized): Secondary | ICD-10-CM

## 2020-06-13 NOTE — Therapy (Signed)
[facility] [address] [place], [place], [address] Phone: [phone]   Fax:  [phone]  Occupational Therapy Treatment  Patient Details  Name: VERLE BRILLHART MRN: [id number] Date of Birth: October 25, 1986 Referring Provider (OT): Marva Panda, NP   Encounter Date: 06/13/2020   OT End of Session - 06/13/20 1724    Visit Number 5    Number of Visits 12    Date for OT Re-Evaluation 07/08/20    Authorization Type 8 add'l visits between 6/14-7/11    Authorization Time Period 6/14- 07/08/20    Authorization - Visit Number 1    Authorization - Number of Visits 8    OT Start Time 1445    OT Stop Time 1530    OT Time Calculation (min) 45 min    Activity Tolerance Patient tolerated treatment well    Behavior During Therapy [facility] for tasks assessed/performed           Past Medical History:  Diagnosis Date   Acid reflux    Cerebral palsy ([facility])     Past Surgical History:  Procedure Laterality Date   dorsal rhizotomy     back ligament looosening surgery   ESOPHAGOGASTRODUODENOSCOPY (EGD) WITH PROPOFOL N/A 10/08/2016   Procedure: ESOPHAGOGASTRODUODENOSCOPY (EGD) WITH PROPOFOL;  Surgeon: Willis Modena, MD;  Location: [facility];  Service: Endoscopy;  Laterality: N/A;   left arm surgery      There were no vitals filed for this visit.   Subjective Assessment - 06/13/20 1451    Subjective  I used the rocker knife and I absolutely love it - I could cut up lasagne    Currently in Pain? No/denies    Pain Score 0-No pain                                OT Education - 06/13/20 1724    Education Details BUE Exercise program    Person(s) Educated Patient    Methods Explanation;Demonstration;Verbal cues;Handout    Comprehension Verbalized understanding;Returned demonstration            OT Short Term Goals - 06/04/20 1556      OT SHORT TERM GOAL #1   Title Pt to verbalize understanding  with potential A/E needs    Status Achieved      OT SHORT TERM GOAL #2   Title Pt to verbalize understanding with HEP    Status Achieved             OT Long Term Goals - 06/13/20 1453      OT LONG TERM GOAL #1   Title Pt to perform UB dressing and bathing with mod assist and A/E prn    Status Achieved      OT LONG TERM GOAL #2   Title Pt to perform dynamic sitting EOB with close supervision in prep for seated ADLS    Status Achieved                 Plan - 06/13/20 1726    Clinical Impression Statement Patient continues to bathe and dress upper body with less assistance, and she was able to use rocker knife to cut food on plate.    OT Frequency 2x / week    OT Duration 4 weeks    OT Treatment/Interventions Self-care/ADL training;Patient/family education;Therapeutic exercise;Balance training;Therapeutic activities;Functional Mobility Training    Plan Try UE exercises on Equalizer, transfer training, ADL  A/E - explore    OT Home Exercise Plan Theraband - yellow for BUE    Consulted and Agree with Plan of Care Patient           Patient will benefit from skilled therapeutic intervention in order to improve the following deficits and impairments:           Visit Diagnosis: Abnormal posture  Muscle weakness (generalized)  Other symptoms and signs involving the nervous system  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 [place], [place], [address] Phone: [phone]   Fax:  [phone]  Name: MATISHA TERMINE MRN: [id number] Date of Birth: January 15, 1986

## 2020-06-13 NOTE — Patient Instructions (Signed)
Seated in wheelchair - use your yellow resistance band  Chest press- Stretch band so there is no slack Move the band from your chest forward by bending and straightening your elbows and shoulders.   10 Reps  Press Up - Stretch band so there is no slack Move the band upward over head - working to stretch left elbow toward straight 10 reps  Rotation - Stretch band as before With arms pressed forward as in chest press - add gentle side to side motion.   Without trunk rotation x 10 reps  With trunk rotation x 10 reps  Resistance band exercises for right UE Use left hand to stabilize band against chest - and punch forward with your right hand  10 reps  Holding the band in both hands - pull out any tension - slowly move hands with elbows straight away from the center, hold for three seconds.  Do not alow the band to snap you back to starting position.  10 reps

## 2020-06-18 ENCOUNTER — Ambulatory Visit: Payer: Medicaid Other

## 2020-06-18 ENCOUNTER — Encounter: Payer: Medicaid Other | Admitting: Occupational Therapy

## 2020-06-18 DIAGNOSIS — R2689 Other abnormalities of gait and mobility: Secondary | ICD-10-CM

## 2020-06-18 DIAGNOSIS — R293 Abnormal posture: Secondary | ICD-10-CM

## 2020-06-18 DIAGNOSIS — G800 Spastic quadriplegic cerebral palsy: Secondary | ICD-10-CM

## 2020-06-18 DIAGNOSIS — M6281 Muscle weakness (generalized): Secondary | ICD-10-CM

## 2020-06-18 NOTE — Patient Instructions (Signed)
Access Code: [email or web address] URL: [email or web address] Date: 06/18/2020 Prepared by: Jethro Bastos  Exercises Supine Ankle Dorsiflexion Stretch with Caregiver - 2 x daily - 7 x weekly - 3 sets - 30 hold Supine Hamstring Stretch with Caregiver - 2 x daily - 7 x weekly - 3 sets - 30 hold Hooklying Gluteal Sets - 2 x daily - 7 x weekly - 2 sets - 10 reps Bent Knee Fallouts - 1 x daily - 7 x weekly - 2 sets - 10 reps Hip and Knee Extension and Flexion Caregiver PROM - 2 x daily - 7 x weekly - 1 sets - 10 reps Seated March - 1 x daily - 7 x weekly - 3 sets - 10 reps Long Sitting Quad Set - 1 x daily - 7 x weekly - 2 sets - 10 reps Seated Hip Abduction - 1 x daily - 7 x weekly - 3 sets - 10 reps

## 2020-06-18 NOTE — Therapy (Signed)
[facility] [address] [place], [place], [address] Phone: [phone]   Fax:  [phone]  Physical Therapy Treatment  Patient Details  Name: Christine Gomez MRN: [id number] Date of Birth: 19-Nov-1986 Referring Provider (PT): Marva Panda   Encounter Date: 06/18/2020   PT End of Session - 06/18/20 1409    Visit Number 9    Number of Visits 11    Authorization Type Medicaid    Authorization Time Period 10 Visits (05/17/2020 - 07/11/2020)    Authorization - Visit Number 8    Authorization - Number of Visits 10    Progress Note Due on Visit 10    PT Start Time 1401    PT Stop Time 1446    PT Time Calculation (min) 45 min    Equipment Utilized During Treatment Gait belt    Activity Tolerance Patient tolerated treatment well    Behavior During Therapy WFL for tasks assessed/performed           Past Medical History:  Diagnosis Date  . Acid reflux   . Cerebral palsy [facility])     Past Surgical History:  Procedure Laterality Date  . dorsal rhizotomy     back ligament looosening surgery  . ESOPHAGOGASTRODUODENOSCOPY (EGD) WITH PROPOFOL N/A 10/08/2016   Procedure: ESOPHAGOGASTRODUODENOSCOPY (EGD) WITH PROPOFOL;  Surgeon: Willis Modena, MD;  Location: [facility];  Service: Endoscopy;  Laterality: N/A;  . left arm surgery      There were no vitals filed for this visit.   Subjective Assessment - 06/18/20 1406    Subjective Patient reports doing well. Reports being frustrated with her body. Reports that she was in the chair alot Friday. Saturday and Sunday was in bed.    Pertinent History CP, microcytic hyochromic anemia, dorsal rhizotomy 1995, bilateral hip dislocation right after that.    Patient Stated Goals To be able to transfer easier.    Currently in Pain? No/denies    Pain Onset More than a month ago                             [facility] Adult PT Treatment/Exercise - 06/18/20 0001       Therapeutic Activites    Therapeutic Activities Other Therapeutic Activities    Other Therapeutic Activities Completed standing tolerance activity in // bars. Patient able to stand max A with PT. Completed x 3 reps. Patient able to stand 48 secs, 52 secs, and 60 secs. Extended rest break required between each due to fatigue. Patient continues to demonstrate demo flexed posture in standing due to contractures at this time. PT providing manual facilitation for improved standing posture, and verbal cues for technique. Patient require extended seated rest breaks between standing due to decreased endurance and strength      Exercises   Exercises Other Exercises    Other Exercises  Completed update to HEP, PT educated on proper completion. See below or patient isntructions for details.             Access Code: [email or web address] URL: [email or web address] Date: 06/18/2020 Prepared by: Jethro Bastos  Exercises Seated March - 1 x daily - 7 x weekly - 3 sets - 10 reps Long Sitting Quad Set - 1 x daily - 7 x weekly - 2 sets - 10 reps Seated Hip Abduction - 1 x daily - 7 x weekly - 3 sets - 10 reps       PT Education -  06/18/20 1551    Education Details Educated on HEP update    Person(s) Educated Patient    Methods Explanation;Demonstration;Handout    Comprehension Verbalized understanding;Returned demonstration            PT Short Term Goals - 06/04/20 1517      PT SHORT TERM GOAL #1   Title Pt will be able to perform initial HEP for flexibility/ROM, strengthening with assist of aides for improved mobility.    Baseline Reports completing HEP with assist of aide/friend 3-4 times week    Time 4    Period Weeks    Status On-going    Target Date 06/08/20      PT SHORT TERM GOAL #2   Title Pt will be able to perform slideboard transfer max assist of 1 person for improved mobility.    Baseline Slideboard transfer Mod A,    Time 4    Period Weeks    Status Achieved    Target  Date 06/08/20      PT SHORT TERM GOAL #3   Title Pt will be able to stand in Stedy x 2 min to allow weight bearing through legs and improve strength.    Baseline Able to stand in // bars for 1 min with Max A +2    Time 4    Period Weeks    Status On-going    Target Date 06/08/20      PT SHORT TERM GOAL #4   Title Pt will be able to perform bed mobility mod assist for improved function.    Baseline Patient still require Max A for supine to sit    Time 4    Period Weeks    Status On-going    Target Date 06/08/20             PT Long Term Goals - 05/09/20 0816      PT LONG TERM GOAL #1   Title Pt will be independent with progress strengthening, flexibility and mobility program for HEP to continue on own.    Baseline no current HEP    Time 8    Period Weeks    Status New    Target Date 07/08/20      PT LONG TERM GOAL #2   Title Pt will be able to perform slideboard transfer versus squat/pivot mod assist for improved ability to participate with transfers.    Baseline total assist    Time 8    Period Weeks    Status New    Target Date 06/08/20      PT LONG TERM GOAL #3   Title Pt will be able to stand at counter x 3 min with UE support min assist for improved functional strength and assist with ADLs.    Baseline Max assist for partial stand a couple seconds.    Time 8    Period Weeks    Status New    Target Date 07/08/20      PT LONG TERM GOAL #4   Title Pt will increase knee extension ROM >5 degrees for improved standing ability.    Baseline right=-14 and left=-30 degrees passively.    Time 8    Period Weeks    Status New    Target Date 07/08/20                 Plan - 06/18/20 1548    Clinical Impression Statement Today's skilled PT session focused on updating HEP to include  BLE strengthening exercises that patient can do independently in her w/c at home. PT educated on proper completion, with patient demonstrating all properly. Then continued standing  tolerance in // bars. Today patient was able to stand between 45 secs - 1 minute with Max Assist from PT. Pt still demo flexed posture and PT providing verbal cues for upright posture during standing.    Personal Factors and Comorbidities Comorbidity 2    Comorbidities microcytic hypochromic anemia, history of dorsal rhizotomy    Examination-Activity Limitations Transfers;Bed Mobility;Toileting    Examination-Participation Restrictions Interpersonal Relationship    Stability/Clinical Decision Making Stable/Uncomplicated    Rehab Potential Good    PT Frequency 2x / week   followed by 1x/week for 6 weeks, plus eval   PT Duration 2 weeks    PT Treatment/Interventions ADLs/Self Care Home Management;Functional mobility training;Therapeutic activities;DME Instruction;Neuromuscular re-education;Balance training;Therapeutic exercise;Gait training;Patient/family education;Manual techniques;Passive range of motion    PT Next Visit Plan Continue transfer training, continue sitting balance. continue standing in // bars. How was HEP additions?    Consulted and Agree with Plan of Care Patient           Patient will benefit from skilled therapeutic intervention in order to improve the following deficits and impairments:  Decreased balance, Decreased mobility, Decreased range of motion, Decreased knowledge of use of DME, Decreased strength, Impaired tone, Impaired flexibility, Postural dysfunction, Impaired UE functional use  Visit Diagnosis: Abnormal posture  Muscle weakness (generalized)  Spastic quadriplegic cerebral palsy (HCC)  Other abnormalities of gait and mobility     Problem List Patient Active Problem List   Diagnosis Date Noted  . Cerebral palsy ([place]) 04/25/2020  . 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 Other acquired deformity of other parts of limb 04/25/2020  . 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  Unspecified deformity of forearm, excluding fingers 04/25/2020  . Urinary incontinence 04/25/2020  . Iron deficiency anemia due to chronic blood loss 07/10/2017    Jones Bales, PT, DPT 06/18/2020, 3:59 PM  [facility] [address] [place], [place], [address] Phone: [phone]   Fax:  [phone]  Name: Christine Gomez MRN: [id number] Date of Birth: 07/08/1986

## 2020-06-20 ENCOUNTER — Encounter: Payer: Medicaid Other | Admitting: Occupational Therapy

## 2020-06-25 ENCOUNTER — Ambulatory Visit: Payer: Medicaid Other

## 2020-06-25 ENCOUNTER — Other Ambulatory Visit: Payer: Self-pay

## 2020-06-25 DIAGNOSIS — G800 Spastic quadriplegic cerebral palsy: Secondary | ICD-10-CM

## 2020-06-25 DIAGNOSIS — R293 Abnormal posture: Secondary | ICD-10-CM | POA: Diagnosis not present

## 2020-06-25 DIAGNOSIS — R2689 Other abnormalities of gait and mobility: Secondary | ICD-10-CM

## 2020-06-25 DIAGNOSIS — M6281 Muscle weakness (generalized): Secondary | ICD-10-CM

## 2020-06-25 NOTE — Therapy (Signed)
[facility] [address] [place], [place], [address] Phone: [phone]   Fax:  [phone]  Physical Therapy Treatment  Patient Details  Name: Christine Gomez MRN: [id number] Date of Birth: 11-30-1986 Referring Provider (PT): Marva Panda   Encounter Date: 06/25/2020   PT End of Session - 06/25/20 1405    Visit Number 10    Number of Visits 11    Authorization Type Medicaid    Authorization Time Period 10 Visits (05/17/2020 - 07/11/2020)    Authorization - Visit Number 9    Authorization - Number of Visits 10    Progress Note Due on Visit 10    PT Start Time 1401    PT Stop Time 1445    PT Time Calculation (min) 44 min    Equipment Utilized During Treatment Gait belt    Activity Tolerance Patient tolerated treatment well    Behavior During Therapy WFL for tasks assessed/performed           Past Medical History:  Diagnosis Date  . Acid reflux   . Cerebral palsy [facility])     Past Surgical History:  Procedure Laterality Date  . dorsal rhizotomy     back ligament looosening surgery  . ESOPHAGOGASTRODUODENOSCOPY (EGD) WITH PROPOFOL N/A 10/08/2016   Procedure: ESOPHAGOGASTRODUODENOSCOPY (EGD) WITH PROPOFOL;  Surgeon: Willis Modena, MD;  Location: [facility];  Service: Endoscopy;  Laterality: N/A;  . left arm surgery      There were no vitals filed for this visit.   Subjective Assessment - 06/25/20 1403    Subjective Patient repots doing good. Patient reports some swelling in RLE. Has been having some stomach issues.    Pertinent History CP, microcytic hyochromic anemia, dorsal rhizotomy 1995, bilateral hip dislocation right after that.    Patient Stated Goals To be able to transfer easier.    Currently in Pain? No/denies    Pain Onset More than a month ago                             [facility] Adult PT Treatment/Exercise - 06/25/20 0001      Transfers   Transfers Lateral/Scoot  Transfers    Lateral/Scoot Transfers 3: Mod assist;2: Max Marine scientist Details (indicate cue type and reason) completed sliding board transfer w/c <> mat, with focus on improved patient participation. Patient able to complete weight shift, patient required assistance with positioning transfer board. patient worked on Development worker, international aid but demo difficulty due to LUE strength and mobility. Patient demo improved ability to transfer going from mat <> w/c today. PT providing verbal cues for improved forward lean for weight shift and improved technique with transfer.       Self-Care   Self-Care Other Self-Care Comments    Other Self-Care Comments  Educated on completed prolonged adduction stretch with pilllows placed between BLE throughout the day while sitting in w/c. PT educated on proper completion to allow for completion at home.       Therapeutic Activites    Therapeutic Activities Other Therapeutic Activities    Other Therapeutic Activities Completed static seated balance on edge of mat with 6" block placed under BLE with focus on sitting w/o back support x 5 minutes with improved posture. Completed modified sit ups with wedge + 2 pillows placed behind with focused on eccentric lowering and coming back into sitting with maintaining balance. Upon return to sitting,  completed reaching in various directions with BUE. Completed 2 x 5 reps. Also completed R/L lateral trunk flexion 2 x 5 each direction, also working on using core to come back up to sitting in neutral alignment. PT providing verbal cues and tactile cues for proper completion, patient overall only require CGA intermittently to maintain balance.       Exercises   Exercises Other Exercises    Other Exercises  With patient seated on edge of mat, PT completed manual stretching, 3 x 45 secs to bilateral hip adductors. Followed by concentric hip abduction by patient, 1 x 5 reps.                   PT Education  - 06/25/20 1640    Education Details Educated on prolonged hip adduction stretch (with pillow)    Person(s) Educated Patient    Methods Explanation;Demonstration    Comprehension Verbalized understanding;Returned demonstration            PT Short Term Goals - 06/04/20 1517      PT SHORT TERM GOAL #1   Title Pt will be able to perform initial HEP for flexibility/ROM, strengthening with assist of aides for improved mobility.    Baseline Reports completing HEP with assist of aide/friend 3-4 times week    Time 4    Period Weeks    Status On-going    Target Date 06/08/20      PT SHORT TERM GOAL #2   Title Pt will be able to perform slideboard transfer max assist of 1 person for improved mobility.    Baseline Slideboard transfer Mod A,    Time 4    Period Weeks    Status Achieved    Target Date 06/08/20      PT SHORT TERM GOAL #3   Title Pt will be able to stand in Stedy x 2 min to allow weight bearing through legs and improve strength.    Baseline Able to stand in // bars for 1 min with Max A +2    Time 4    Period Weeks    Status On-going    Target Date 06/08/20      PT SHORT TERM GOAL #4   Title Pt will be able to perform bed mobility mod assist for improved function.    Baseline Patient still require Max A for supine to sit    Time 4    Period Weeks    Status On-going    Target Date 06/08/20             PT Long Term Goals - 05/09/20 0816      PT LONG TERM GOAL #1   Title Pt will be independent with progress strengthening, flexibility and mobility program for HEP to continue on own.    Baseline no current HEP    Time 8    Period Weeks    Status New    Target Date 07/08/20      PT LONG TERM GOAL #2   Title Pt will be able to perform slideboard transfer versus squat/pivot mod assist for improved ability to participate with transfers.    Baseline total assist    Time 8    Period Weeks    Status New    Target Date 06/08/20      PT LONG TERM GOAL #3   Title  Pt will be able to stand at counter x 3 min with UE support min assist for improved  functional strength and assist with ADLs.    Baseline Max assist for partial stand a couple seconds.    Time 8    Period Weeks    Status New    Target Date 07/08/20      PT LONG TERM GOAL #4   Title Pt will increase knee extension ROM >5 degrees for improved standing ability.    Baseline right=-14 and left=-30 degrees passively.    Time 8    Period Weeks    Status New    Target Date 07/08/20                 Plan - 06/25/20 1645    Clinical Impression Statement Today's skilled PT session focused on continued transfer trianing with sliding board, with focus on improved patient participation and completion. At this time patient still require between Mod - Max A for transfers. Followed with activies focused on static seated balance and posture with reaching activities to promote core control. Patient will continue to benefit from skilled PT services to maximize funcitonal mobility.    Personal Factors and Comorbidities Comorbidity 2    Comorbidities microcytic hypochromic anemia, history of dorsal rhizotomy    Examination-Activity Limitations Transfers;Bed Mobility;Toileting    Examination-Participation Restrictions Interpersonal Relationship    Stability/Clinical Decision Making Stable/Uncomplicated    Rehab Potential Good    PT Frequency 2x / week   followed by 1x/week for 6 weeks, plus eval   PT Duration 2 weeks    PT Treatment/Interventions ADLs/Self Care Home Management;Functional mobility training;Therapeutic activities;DME Instruction;Neuromuscular re-education;Balance training;Therapeutic exercise;Gait training;Patient/family education;Manual techniques;Passive range of motion    PT Next Visit Plan Continue transfer training, continue sitting balance. continue standing in // bars. How was hip adduction stretch with pillow?    Consulted and Agree with Plan of Care Patient           Patient  will benefit from skilled therapeutic intervention in order to improve the following deficits and impairments:  Decreased balance, Decreased mobility, Decreased range of motion, Decreased knowledge of use of DME, Decreased strength, Impaired tone, Impaired flexibility, Postural dysfunction, Impaired UE functional use  Visit Diagnosis: Abnormal posture  Muscle weakness (generalized)  Spastic quadriplegic cerebral palsy (HCC)  Other abnormalities of gait and mobility     Problem List Patient Active Problem List   Diagnosis Date Noted  . Cerebral palsy (HCC) 04/25/2020  . 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 Other acquired deformity of other parts of limb 04/25/2020  . 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 Unspecified deformity of forearm, excluding fingers 04/25/2020  . Urinary incontinence 04/25/2020  . Iron deficiency anemia due to chronic blood loss 07/10/2017    Tempie Donning, PT, DPT 06/25/2020, 4:49 PM  [facility] [address] [place], [place], [address] Phone: [phone]   Fax:  [phone]  Name: Christine Gomez MRN: [id number] Date of Birth: 11-04-1986

## 2020-06-29 ENCOUNTER — Encounter: Payer: Medicaid Other | Admitting: Occupational Therapy

## 2020-07-03 ENCOUNTER — Ambulatory Visit: Payer: Medicaid Other | Attending: *Deleted

## 2020-07-03 ENCOUNTER — Other Ambulatory Visit: Payer: Self-pay

## 2020-07-03 DIAGNOSIS — M6281 Muscle weakness (generalized): Secondary | ICD-10-CM

## 2020-07-03 DIAGNOSIS — R293 Abnormal posture: Secondary | ICD-10-CM

## 2020-07-03 DIAGNOSIS — R29818 Other symptoms and signs involving the nervous system: Secondary | ICD-10-CM | POA: Insufficient documentation

## 2020-07-03 DIAGNOSIS — M6249 Contracture of muscle, multiple sites: Secondary | ICD-10-CM | POA: Insufficient documentation

## 2020-07-03 DIAGNOSIS — R2689 Other abnormalities of gait and mobility: Secondary | ICD-10-CM | POA: Diagnosis present

## 2020-07-03 DIAGNOSIS — G800 Spastic quadriplegic cerebral palsy: Secondary | ICD-10-CM | POA: Diagnosis present
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
## 2020-07-04 NOTE — Addendum Note (Signed)
Addended by: Jethro Bastos B on: 07/04/2020 09:18 AM   Modules accepted: Orders

## 2020-07-09 ENCOUNTER — Ambulatory Visit: Payer: Medicaid Other

## 2020-07-11 ENCOUNTER — Ambulatory Visit: Payer: Medicaid Other

## 2020-07-11 ENCOUNTER — Other Ambulatory Visit: Payer: Self-pay

## 2020-07-11 DIAGNOSIS — R293 Abnormal posture: Secondary | ICD-10-CM

## 2020-07-11 DIAGNOSIS — R2689 Other abnormalities of gait and mobility: Secondary | ICD-10-CM

## 2020-07-11 DIAGNOSIS — M6281 Muscle weakness (generalized): Secondary | ICD-10-CM

## 2020-07-11 NOTE — Therapy (Signed)
[facility] [address] [place], [place], [address] Phone: [phone]   Fax:  [phone]  Physical Therapy Treatment  Patient Details  Name: Christine Gomez MRN: [id number] Date of Birth: 1986-05-06 Referring Provider (PT): Marva Panda   Encounter Date: 07/11/2020   PT End of Session - 07/11/20 1235    Visit Number 12    Number of Visits 16    Authorization Type Medicaid    Authorization Time Period 10 Visits (05/17/2020 - 07/11/2020)    Authorization - Visit Number 11    Authorization - Number of Visits 15    Progress Note Due on Visit 10    PT Start Time 1230    PT Stop Time 1315    PT Time Calculation (min) 45 min    Equipment Utilized During Treatment Gait belt    Activity Tolerance Patient tolerated treatment well    Behavior During Therapy WFL for tasks assessed/performed           Past Medical History:  Diagnosis Date  . Acid reflux   . Cerebral palsy [facility])     Past Surgical History:  Procedure Laterality Date  . dorsal rhizotomy     back ligament looosening surgery  . ESOPHAGOGASTRODUODENOSCOPY (EGD) WITH PROPOFOL N/A 10/08/2016   Procedure: ESOPHAGOGASTRODUODENOSCOPY (EGD) WITH PROPOFOL;  Surgeon: Willis Modena, MD;  Location: [facility];  Service: Endoscopy;  Laterality: N/A;  . left arm surgery      There were no vitals filed for this visit.   Subjective Assessment - 07/11/20 1238    Subjective Patient reports mentally struggling, but physically well. Believes she is putting too much pressure on herself. Patient got her power chair fixed. Reports a friend is going to be purhcasing her a sliding board.    Pertinent History CP, microcytic hyochromic anemia, dorsal rhizotomy 1995, bilateral hip dislocation right after that.    Patient Stated Goals To be able to transfer easier.    Currently in Pain? No/denies    Pain Onset More than a month ago                              [facility] Adult PT Treatment/Exercise - 07/11/20 0001      Transfers   Transfers Lateral/Scoot Transfers    Lateral/Scoot Transfers 3: Mod assist;2: Max Marine scientist Details (indicate cue type and reason) continued task specific practice of sliding board transfer from w/c to mat multiple times. Spent extensive time breaking down portions of the transfer. Patient continues to demonstrate difficulty with placing the sliding board, but was able to remove the sliding board with her RUE today. Patient still requiring Max A with transfer from w/c to mat. With mat <> w/c, transfer patient was able to complete approx 35-40%. PT providing verbal cues for improved forward lean. PT and patient also trialing different hand positions to allow for improved pushing ability with RUE. With transfers continue to have 6" step placed under BLE to allow for improved pushing through lower extremities.      Self-Care   Self-Care Other Self-Care Comments    Other Self-Care Comments  PT educating on purchase options for sliding board, as of right now a friend will be purchasing patient a sliding board. PT educating on looking into getting a gait belt and a 6" step as utilized in therapy sessions to allow for further improvements of transfers at home. Also  educating on scheduling visits around when caregiver can come in with patient to allow for further training with sliding board.       Therapeutic Activites    Therapeutic Activities Other Therapeutic Activities    Other Therapeutic Activities Completed static seated balance on mat without back support. PT providing tactile cues and faciliation for upright posture. Patient able to complete transfer and sit 5-7 minutes before requiring rest break and recline on wedge + 2 pillows.                     PT Short Term Goals - 07/04/20 0909      PT SHORT TERM GOAL #1   Title Pt will be able to perform initial HEP  for flexibility/ROM, strengthening with assist of aides for improved mobility.    Baseline Reports completing HEP with assist of aide/friend 3-4 times week    Time 3    Period Weeks    Status On-going      PT SHORT TERM GOAL #2   Title Patient will demonstrate ability to stand in parallel bars with Max Assist for 1 minute.    Baseline <1 minute    Time 3    Period Weeks    Status Revised      PT SHORT TERM GOAL #3   Title Pt will be able to perform bed mobility mod assist for improved function.    Baseline Patient still require Max A for supine to sit    Time 3    Period Weeks    Status Revised             PT Long Term Goals - 07/03/20 1429      PT LONG TERM GOAL #1   Title Pt will be independent with progress strengthening, flexibility and mobility program for HEP to continue on own. ALL LTG due at end of Medicaid Auth (5 Weeks)    Baseline Patient currently completing HEP with assistance approx 3x/week, continue to progress as tolerated    Time 5    Period Weeks    Status On-going      PT LONG TERM GOAL #2   Title Pt will be able to perform slideboard transfer versus squat/pivot mod assist for improved ability to participate with transfers.    Baseline able to complete slideboard with varying Mod - Max A.    Time 5    Period Weeks    Status On-going      PT LONG TERM GOAL #3   Title Pt will be able to stand in // bars with Mod A and UE support for 1 minutes for improved functional strength and assist with ADLs.    Baseline Patient able to stand in // bars with max A from PT on avg 45 seconds.    Time 5    Period Weeks    Status Revised      PT LONG TERM GOAL #4   Title Pt will increase knee extension ROM >5 degrees for improved standing ability.    Baseline right=-14 and left=-30 degrees passively (not assessed today, but still demo significant limitations in ROM)    Time 5    Period Weeks    Status On-going                 Plan - 07/11/20 1333     Clinical Impression Statement Today's skilled PT session focused on extensive transfer training with sliding board from mat <> w/c. Patient  continues to require Mod A to Max with transfer. Patient is demonstrating progress as was able to remove the sliding board today, still demo difficulty with placing sliding board at this time. Patient will continue to benefit from skilled PT services to progress toward goals.    Personal Factors and Comorbidities Comorbidity 2    Comorbidities microcytic hypochromic anemia, history of dorsal rhizotomy    Examination-Activity Limitations Transfers;Bed Mobility;Toileting    Examination-Participation Restrictions Interpersonal Relationship    Stability/Clinical Decision Making Stable/Uncomplicated    Rehab Potential Good    PT Frequency 1x / week    PT Duration --   5 weeks, pending medicaid auth   PT Treatment/Interventions ADLs/Self Care Home Management;Functional mobility training;Therapeutic activities;DME Instruction;Neuromuscular re-education;Balance training;Therapeutic exercise;Gait training;Patient/family education;Manual techniques;Passive range of motion    PT Next Visit Plan Continue transfer training, continue sitting balance. continue standing in // bars. Any update if caregiver can come in.    Consulted and Agree with Plan of Care Patient           Patient will benefit from skilled therapeutic intervention in order to improve the following deficits and impairments:  Decreased balance, Decreased mobility, Decreased range of motion, Decreased knowledge of use of DME, Decreased strength, Impaired tone, Impaired flexibility, Postural dysfunction, Impaired UE functional use  Visit Diagnosis: Abnormal posture  Muscle weakness (generalized)  Other abnormalities of gait and mobility     Problem List Patient Active Problem List   Diagnosis Date Noted  . Cerebral palsy (HCC) 04/25/2020  . 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 Other  acquired deformity of other parts of limb 04/25/2020  . 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 Unspecified deformity of forearm, excluding fingers 04/25/2020  . Urinary incontinence 04/25/2020  . Iron deficiency anemia due to chronic blood loss 07/10/2017    Tempie Donning, PT, DPT 07/11/2020, 1:37 PM  [facility] [address] [place], [place], [address] Phone: [phone]   Fax:  [phone]  Name: JOSS FRIEDEL MRN: [id number] Date of Birth: 1986/01/04

## 2020-07-16 ENCOUNTER — Other Ambulatory Visit: Payer: Self-pay

## 2020-07-16 ENCOUNTER — Ambulatory Visit: Payer: Medicaid Other

## 2020-07-16 DIAGNOSIS — R293 Abnormal posture: Secondary | ICD-10-CM

## 2020-07-16 DIAGNOSIS — M6281 Muscle weakness (generalized): Secondary | ICD-10-CM

## 2020-07-16 DIAGNOSIS — R2689 Other abnormalities of gait and mobility: Secondary | ICD-10-CM

## 2020-07-16 DIAGNOSIS — M6249 Contracture of muscle, multiple sites: Secondary | ICD-10-CM

## 2020-07-16 NOTE — Therapy (Signed)
[facility] [address] [place], [place], [address] Phone: [phone]   Fax:  [phone]  Physical Therapy Treatment  Patient Details  Name: Christine Gomez MRN: [id number] Date of Birth: [birth year] Referring Provider (PT): Marva Panda   Encounter Date: 07/16/2020   PT End of Session - 07/16/20 1237    Visit Number 13    Number of Visits 16    Authorization Type Medicaid    Authorization Time Period 10 Visits (05/17/2020 - 07/11/2020), 5 visits (pending)    Authorization - Visit Number 12    Authorization - Number of Visits 15    Progress Note Due on Visit 10    PT Start Time 1231    PT Stop Time 1316    PT Time Calculation (min) 45 min    Equipment Utilized During Treatment Gait belt    Activity Tolerance Patient tolerated treatment well    Behavior During Therapy WFL for tasks assessed/performed           Past Medical History:  Diagnosis Date  . Acid reflux   . Cerebral palsy [facility])     Past Surgical History:  Procedure Laterality Date  . dorsal rhizotomy     back ligament looosening surgery  . ESOPHAGOGASTRODUODENOSCOPY (EGD) WITH PROPOFOL N/A 10/08/2016   Procedure: ESOPHAGOGASTRODUODENOSCOPY (EGD) WITH PROPOFOL;  Surgeon: Willis Modena, MD;  Location: [facility];  Service: Endoscopy;  Laterality: N/A;  . left arm surgery      There were no vitals filed for this visit.   Subjective Assessment - 07/16/20 1235    Subjective Patient rpeorts that she got the sliding board, still working toward getting gait belt and step for feet. Patient reports stretched since thursday. Patient reports spent more time in chair over weekend.    Pertinent History CP, microcytic hyochromic anemia, dorsal rhizotomy 1995, bilateral hip dislocation right after that.    Patient Stated Goals To be able to transfer easier.    Currently in Pain? Yes    Pain Score 3     Pain Location Back    Pain Orientation Lower     Pain Descriptors / Indicators Aching    Pain Type Chronic pain    Pain Onset More than a month ago    Pain Frequency Intermittent    Aggravating Factors  weather                             [facility] Adult PT Treatment/Exercise - 07/16/20 0001      Bed Mobility   Supine to Sit Maximal Assistance - Patient - Patient 25-49%   require assistance with LE's and trunk   Sit to Supine Maximal Assistance - Patient 25-49%   require assistance with LE's and trunk     Transfers   Transfers Lateral/Scoot Transfers    Lateral/Scoot Transfers 3: Mod assist;2: Max assist    Lateral/Scoot Transfer Details (indicate cue type and reason) completed w/ sliding board, continued to reduce assistance with transfer to promote independence, however patient still require varying mod - max A with transfer at this time. completed from w/c <> mat with sliding board. Patient still working toward being able to properly place board with Mod A from PT.       Therapeutic Activites    Therapeutic Activities Other Therapeutic Activities    Other Therapeutic Activities Completed standing tolerance activites in //  bars with Max A, patient able to  stand 30-45 seconds x 3 trials. Patient conitnues to demo flexed posture in stnading despite manual faciliation from PT for improved posture and knee/hip extension with standing.       Exercises   Exercises Other Exercises    Other Exercises  PT providing manual stretching to BLE's in supine: including BLE hamstring stretch, 2 x 45 sec - 1 minue each. gastroc stretch 2 x 1 min each, and hip adductors 2 x 1 min each. Progressively stretching into range as tolerated by patient, to further improve standing posture.                     PT Short Term Goals - 07/04/20 0909      PT SHORT TERM GOAL #1   Title Pt will be able to perform initial HEP for flexibility/ROM, strengthening with assist of aides for improved mobility.    Baseline Reports completing HEP  with assist of aide/friend 3-4 times week    Time 3    Period Weeks    Status On-going      PT SHORT TERM GOAL #2   Title Patient will demonstrate ability to stand in parallel bars with Max Assist for 1 minute.    Baseline <1 minute    Time 3    Period Weeks    Status Revised      PT SHORT TERM GOAL #3   Title Pt will be able to perform bed mobility mod assist for improved function.    Baseline Patient still require Max A for supine to sit    Time 3    Period Weeks    Status Revised             PT Long Term Goals - 07/03/20 1429      PT LONG TERM GOAL #1   Title Pt will be independent with progress strengthening, flexibility and mobility program for HEP to continue on own. ALL LTG due at end of Medicaid Auth (5 Weeks)    Baseline Patient currently completing HEP with assistance approx 3x/week, continue to progress as tolerated    Time 5    Period Weeks    Status On-going      PT LONG TERM GOAL #2   Title Pt will be able to perform slideboard transfer versus squat/pivot mod assist for improved ability to participate with transfers.    Baseline able to complete slideboard with varying Mod - Max A.    Time 5    Period Weeks    Status On-going      PT LONG TERM GOAL #3   Title Pt will be able to stand in // bars with Mod A and UE support for 1 minutes for improved functional strength and assist with ADLs.    Baseline Patient able to stand in // bars with max A from PT on avg 45 seconds.    Time 5    Period Weeks    Status Revised      PT LONG TERM GOAL #4   Title Pt will increase knee extension ROM >5 degrees for improved standing ability.    Baseline right=-14 and left=-30 degrees passively (not assessed today, but still demo significant limitations in ROM)    Time 5    Period Weeks    Status On-going                 Plan - 07/16/20 1540    Clinical Impression Statement Continued task specific transfer training  with sliding board. PT completed manual  stretching in supine, with focused on improved range for standing posture. In standing, patient able to stand 30-45 seconds today with Max A in // bars. Patient will continue to benefit from skilled PT services to progress toward goals.    Personal Factors and Comorbidities Comorbidity 2    Comorbidities microcytic hypochromic anemia, history of dorsal rhizotomy    Examination-Activity Limitations Transfers;Bed Mobility;Toileting    Examination-Participation Restrictions Interpersonal Relationship    Stability/Clinical Decision Making Stable/Uncomplicated    Rehab Potential Good    PT Frequency 1x / week    PT Duration --   5 weeks, pending medicaid auth   PT Treatment/Interventions ADLs/Self Care Home Management;Functional mobility training;Therapeutic activities;DME Instruction;Neuromuscular re-education;Balance training;Therapeutic exercise;Gait training;Patient/family education;Manual techniques;Passive range of motion    PT Next Visit Plan Continue transfer training, continue sitting balance. continue standing in // bars. Any update if caregiver can come in.    Consulted and Agree with Plan of Care Patient           Patient will benefit from skilled therapeutic intervention in order to improve the following deficits and impairments:  Decreased balance, Decreased mobility, Decreased range of motion, Decreased knowledge of use of DME, Decreased strength, Impaired tone, Impaired flexibility, Postural dysfunction, Impaired UE functional use  Visit Diagnosis: Abnormal posture  Other abnormalities of gait and mobility  Muscle weakness (generalized)  Contracture of muscle, multiple sites     Problem List Patient Active Problem List   Diagnosis Date Noted  . Cerebral palsy (HCC) 04/25/2020  . 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 Other acquired deformity of other parts of limb 04/25/2020  . 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 Unspecified deformity of forearm, excluding fingers 04/25/2020  . Urinary incontinence 04/25/2020  . Iron deficiency anemia due to chronic blood loss 07/10/2017    Tempie Donning, PT, DPT 07/16/2020, 3:42 PM  [facility] [address] [place], [place], [address] Phone: [phone]   Fax:  [phone]  Name: Christine Gomez MRN: [id number] Date of Birth: [birth year]

## 2020-07-19 ENCOUNTER — Other Ambulatory Visit: Payer: Self-pay

## 2020-07-19 ENCOUNTER — Ambulatory Visit: Payer: Medicaid Other | Admitting: Occupational Therapy

## 2020-07-19 ENCOUNTER — Encounter: Payer: Self-pay | Admitting: Occupational Therapy

## 2020-07-19 DIAGNOSIS — G800 Spastic quadriplegic cerebral palsy: Secondary | ICD-10-CM

## 2020-07-19 DIAGNOSIS — M6281 Muscle weakness (generalized): Secondary | ICD-10-CM

## 2020-07-19 DIAGNOSIS — R293 Abnormal posture: Secondary | ICD-10-CM

## 2020-07-19 DIAGNOSIS — R29818 Other symptoms and signs involving the nervous system: Secondary | ICD-10-CM

## 2020-07-19 NOTE — Therapy (Signed)
[facility] [address] [place], [place], [address] Phone: [phone]   Fax:  [phone]  Occupational Therapy Treatment  Patient Details  Name: Christine Gomez MRN: [id number] Date of Birth: August 16, 1986 Referring Provider (OT): Marva Panda, NP   Encounter Date: 07/19/2020   OT End of Session - 07/19/20 1544    Visit Number 6    Number of Visits 12    Date for OT Re-Evaluation 07/28/20    Authorization - Visit Number 2    Authorization - Number of Visits 8    OT Start Time 1445    OT Stop Time 1530    OT Time Calculation (min) 45 min    Behavior During Therapy [facility] for tasks assessed/performed           Past Medical History:  Diagnosis Date   Acid reflux    Cerebral palsy (HCC)     Past Surgical History:  Procedure Laterality Date   dorsal rhizotomy     back ligament looosening surgery   ESOPHAGOGASTRODUODENOSCOPY (EGD) WITH PROPOFOL N/A 10/08/2016   Procedure: ESOPHAGOGASTRODUODENOSCOPY (EGD) WITH PROPOFOL;  Surgeon: Willis Modena, MD;  Location: [facility];  Service: Endoscopy;  Laterality: N/A;   left arm surgery      There were no vitals filed for this visit.   Subjective Assessment - 07/19/20 1541    Subjective  I showed the video of my standing to the team and the freaked!    Currently in Pain? No/denies    Pain Score 0-No pain                        OT Treatments/Exercises (OP) - 07/19/20 0001      ADLs   UB Dressing Patient is consistently dressing her upper body.      LB Dressing Discussed various means to dress or assist with dressing lower body .  Need actual blocked practice of this skill.    Functional Mobility Working toward increased independence and ability to direct caregivers to safely complete.  Patient with improved ability to push with LE's and UE when transferring toward left side.        Neurological Re-education Exercises   Other Exercises 1  Equlaizer for chest press, and upright row 15 lbs LeftUE, 20 LBS rue X 10 REPS.   LUE Hand grip to maintain contact with weight bar.                    OT Education - 07/19/20 1543    Education Details Directing caregivers to complete sliding board transfer    Person(s) Educated Patient    Methods Explanation;Demonstration    Comprehension Verbalized understanding;Returned demonstration            OT Short Term Goals - 06/04/20 1556      OT SHORT TERM GOAL #1   Title Pt to verbalize understanding with potential A/E needs    Status Achieved      OT SHORT TERM GOAL #2   Title Pt to verbalize understanding with HEP    Status Achieved             OT Long Term Goals - 06/13/20 1453      OT LONG TERM GOAL #1   Title Pt to perform UB dressing and bathing with mod assist and A/E prn    Status Achieved      OT LONG TERM GOAL #2   Title  Pt to perform dynamic sitting EOB with close supervision in prep for seated ADLS    Status Achieved                 Plan - 07/19/20 1546    Clinical Impression Statement Patient has had a gap in service due to schedule availability, and awaiting MCD auth.  Patient continues to benefit from transfer training, ADL training, and postural control, UE strengthening.    OT Frequency 2x / week    OT Duration 4 weeks    OT Treatment/Interventions Self-care/ADL training;Patient/family education;Therapeutic exercise;Balance training;Therapeutic activities;Functional Mobility Training    Plan slide board transfer, LB dressing splinter skills    OT Home Exercise Plan Theraband - yellow for BUE    Consulted and Agree with Plan of Care Patient           Patient will benefit from skilled therapeutic intervention in order to improve the following deficits and impairments:           Visit Diagnosis: Abnormal posture  Muscle weakness (generalized)  Spastic quadriplegic cerebral palsy (HCC)  Other symptoms and signs involving the  nervous system    Problem List Patient Active Problem List   Diagnosis Date Noted   Cerebral palsy (HCC) 04/25/2020   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   Other acquired deformity of other parts of limb 04/25/2020   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

## 2020-07-25 ENCOUNTER — Ambulatory Visit: Payer: Medicaid Other | Admitting: Podiatry

## 2020-07-31 ENCOUNTER — Ambulatory Visit: Payer: Medicaid Other

## 2020-08-03 ENCOUNTER — Ambulatory Visit: Payer: Medicaid Other | Attending: *Deleted

## 2020-08-03 ENCOUNTER — Other Ambulatory Visit: Payer: Self-pay

## 2020-08-03 DIAGNOSIS — M6249 Contracture of muscle, multiple sites: Secondary | ICD-10-CM | POA: Insufficient documentation

## 2020-08-03 DIAGNOSIS — G800 Spastic quadriplegic cerebral palsy: Secondary | ICD-10-CM | POA: Insufficient documentation

## 2020-08-03 DIAGNOSIS — M6281 Muscle weakness (generalized): Secondary | ICD-10-CM | POA: Diagnosis not present

## 2020-08-03 DIAGNOSIS — R2689 Other abnormalities of gait and mobility: Secondary | ICD-10-CM | POA: Diagnosis present

## 2020-08-03 DIAGNOSIS — R293 Abnormal posture: Secondary | ICD-10-CM | POA: Insufficient documentation

## 2020-08-03 DIAGNOSIS — R29818 Other symptoms and signs involving the nervous system: Secondary | ICD-10-CM | POA: Diagnosis present

## 2020-08-03 NOTE — Therapy (Signed)
[facility] [address] [place], [place], [address] Phone: [phone]   Fax:  [phone]  Physical Therapy Treatment  Patient Details  Name: Christine Gomez MRN: [id number] Date of Birth: Mar 20, 1986 Referring Provider (PT): Marva Panda   Encounter Date: 08/03/2020   PT End of Session - 08/03/20 1249    Visit Number 14    Number of Visits 16    Authorization Type Medicaid    Authorization Time Period 10 Visits (05/17/2020 - 07/11/2020), 4 visits (7/19 - 8/16    Authorization - Visit Number 13    Authorization - Number of Visits 15    Progress Note Due on Visit 10    PT Start Time 1021   pt arriving late   PT Stop Time 1104    PT Time Calculation (min) 43 min    Equipment Utilized During Treatment Gait belt    Activity Tolerance Patient tolerated treatment well    Behavior During Therapy WFL for tasks assessed/performed           Past Medical History:  Diagnosis Date  . Acid reflux   . Cerebral palsy [facility])     Past Surgical History:  Procedure Laterality Date  . dorsal rhizotomy     back ligament looosening surgery  . ESOPHAGOGASTRODUODENOSCOPY (EGD) WITH PROPOFOL N/A 10/08/2016   Procedure: ESOPHAGOGASTRODUODENOSCOPY (EGD) WITH PROPOFOL;  Surgeon: Willis Modena, MD;  [facility];  Service: Endoscopy;  Laterality: N/A;  . left arm surgery      There were no vitals filed for this visit.   Subjective Assessment - 08/03/20 1026    Subjective Patient reports been doing well. Patient reports some soreness in the hips and lower back. Friend and caregiver present for session.    Pertinent History CP, microcytic hyochromic anemia, dorsal rhizotomy 1995, bilateral hip dislocation right after that.    Patient Stated Goals To be able to transfer easier.    Currently in Pain? No/denies    Pain Onset More than a month ago                             [facility] Adult PT  Treatment/Exercise - 08/03/20 0001      Transfers   Transfers Lateral/Scoot Transfers    Lateral/Scoot Transfers 3: Mod assist;2: Max Marine scientist Details (indicate cue type and reason) completed with patient's personal sliding board.       Therapeutic Activites    Therapeutic Activities Other Therapeutic Activities    Other Therapeutic Activities Caregiver/Nurse and friend of patient's present for session today but be educated/trained on how to properly complete sliding board transfer with patient to allowed for improved transfers and reduced caregiver burden at home. PT educating on how to properly don/doff gait belt, and proper way to use gait belt with transfers. PT educating on proper placement of sliding board transfer, then demonstrating proper completion of sliding board transfer <> mat, with varying Mod - Max A. After demonstration worked with both caregiver and friend allowing them to complete transfer and provided any appropriate education as needed. Education on importance of lifting mechanics and body position for improved caregiver safety along with patient safety.  Patient's sliding board has removable grip surface on the bottom side making it difficulty to position appropriately in patient's personal power chair, PT educating on potential removal for improved placement. PT also educating on slowing transfer down to allow patient  to complete as much of transfer as possible to promote improved independence. PT also educating on potential need to obtain 6-8" step stool (depending upon patient's bed height) to allow for improved transfer ability at home, as patient has improved ability to push via BLE vs. use of UE.                   PT Education - 08/03/20 1250    Education Details Provided education handout on sliding board transfers (see prior patient instructions, reprinted due to patient losing previous copy)    Person(s) Educated Patient;Caregiver(s);Other  (comment)   Friend   Methods Explanation;Handout;Demonstration    Comprehension Verbalized understanding;Returned demonstration            PT Short Term Goals - 07/04/20 0909      PT SHORT TERM GOAL #1   Title Pt will be able to perform initial HEP for flexibility/ROM, strengthening with assist of aides for improved mobility.    Baseline Reports completing HEP with assist of aide/friend 3-4 times week    Time 3    Period Weeks    Status On-going      PT SHORT TERM GOAL #2   Title Patient will demonstrate ability to stand in parallel bars with Max Assist for 1 minute.    Baseline <1 minute    Time 3    Period Weeks    Status Revised      PT SHORT TERM GOAL #3   Title Pt will be able to perform bed mobility mod assist for improved function.    Baseline Patient still require Max A for supine to sit    Time 3    Period Weeks    Status Revised             PT Long Term Goals - 07/03/20 1429      PT LONG TERM GOAL #1   Title Pt will be independent with progress strengthening, flexibility and mobility program for HEP to continue on own. ALL LTG due at end of Medicaid Auth (5 Weeks)    Baseline Patient currently completing HEP with assistance approx 3x/week, continue to progress as tolerated    Time 5    Period Weeks    Status On-going      PT LONG TERM GOAL #2   Title Pt will be able to perform slideboard transfer versus squat/pivot mod assist for improved ability to participate with transfers.    Baseline able to complete slideboard with varying Mod - Max A.    Time 5    Period Weeks    Status On-going      PT LONG TERM GOAL #3   Title Pt will be able to stand in // bars with Mod A and UE support for 1 minutes for improved functional strength and assist with ADLs.    Baseline Patient able to stand in // bars with max A from PT on avg 45 seconds.    Time 5    Period Weeks    Status Revised      PT LONG TERM GOAL #4   Title Pt will increase knee extension ROM >5  degrees for improved standing ability.    Baseline right=-14 and left=-30 degrees passively (not assessed today, but still demo significant limitations in ROM)    Time 5    Period Weeks    Status On-going  Plan - 08/03/20 1259    Clinical Impression Statement Caregiver and Friend of patient's pressent for today's session with PT educating on all components of sliding board transfer and addressing questions/concerns to allow for improved completion of transfer at home. Caregiver and friend both demonstrating sliding board transfer appropriately during today's session. Patient will continue to benefit from skilled PT services to progress toward all LTG's    Personal Factors and Comorbidities Comorbidity 2    Comorbidities microcytic hypochromic anemia, history of dorsal rhizotomy    Examination-Activity Limitations Transfers;Bed Mobility;Toileting    Examination-Participation Restrictions Interpersonal Relationship    Stability/Clinical Decision Making Stable/Uncomplicated    Rehab Potential Good    PT Frequency 1x / week    PT Duration --   5 weeks, pending medicaid auth   PT Treatment/Interventions ADLs/Self Care Home Management;Functional mobility training;Therapeutic activities;DME Instruction;Neuromuscular re-education;Balance training;Therapeutic exercise;Gait training;Patient/family education;Manual techniques;Passive range of motion    PT Next Visit Plan Continue transfer training. How was transfering at home with sliding board? continue sitting balance. continue standing in // bars. Begin to Check LTG's. See if visit on 8/19 can be rescheduled as Auth runs out on 8/16.    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 Cerebral palsy (HCC) 04/25/2020  . 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 Other acquired deformity of other parts of limb 04/25/2020  . 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 Unspecified deformity of forearm, excluding fingers 04/25/2020  . Urinary incontinence 04/25/2020  . Iron deficiency anemia due to chronic blood loss 07/10/2017    Tempie Donning, PT, DPT 08/03/2020, 1:03 PM  [facility] [address] [place], [place], [address] Phone: [phone]   Fax:  [phone]  Name: Christine Gomez MRN: [id number] Date of Birth: Nov 24, 1986

## 2020-08-07 ENCOUNTER — Ambulatory Visit: Payer: Medicaid Other

## 2020-08-08 ENCOUNTER — Other Ambulatory Visit: Payer: Self-pay

## 2020-08-08 ENCOUNTER — Encounter: Payer: Self-pay | Admitting: Occupational Therapy

## 2020-08-08 ENCOUNTER — Ambulatory Visit: Payer: Medicaid Other | Admitting: Occupational Therapy

## 2020-08-08 DIAGNOSIS — R29818 Other symptoms and signs involving the nervous system: Secondary | ICD-10-CM

## 2020-08-08 DIAGNOSIS — M6281 Muscle weakness (generalized): Secondary | ICD-10-CM

## 2020-08-08 DIAGNOSIS — M6249 Contracture of muscle, multiple sites: Secondary | ICD-10-CM

## 2020-08-08 NOTE — Therapy (Signed)
[facility] [address] [place], [place], [address] Phone: [phone]   Fax:  [phone]  Occupational Therapy Treatment  Patient Details  Name: Christine Gomez MRN: [id number] Date of Birth: [birth year] Referring Provider (OT): Marva Panda, NP   Encounter Date: 08/08/2020   OT End of Session - 08/08/20 1604    Visit Number 7    Number of Visits 12    Date for OT Re-Evaluation 07/28/20    Authorization - Visit Number 2    Authorization - Number of Visits 8    OT Start Time 1320    OT Stop Time 1400    OT Time Calculation (min) 40 min    Behavior During Therapy [facility] for tasks assessed/performed           Past Medical History:  Diagnosis Date  . Acid reflux   . Cerebral palsy [facility])     Past Surgical History:  Procedure Laterality Date  . dorsal rhizotomy     back ligament looosening surgery  . ESOPHAGOGASTRODUODENOSCOPY (EGD) WITH PROPOFOL N/A 10/08/2016   Procedure: ESOPHAGOGASTRODUODENOSCOPY (EGD) WITH PROPOFOL;  Surgeon: Willis Modena, MD;  Location: [facility];  Service: Endoscopy;  Laterality: N/A;  . left arm surgery      There were no vitals filed for this visit.   Subjective Assessment - 08/08/20 1604    Subjective  no reports of pain    Currently in Pain? No/denies                treatment: Therapist began discussing A/E options: use of using handled sponge, foot stool for transfers with sliding board. Discussed need to adapt environment so that pt can increase I with IADLs. (ie: have caregiver position items for making a sandwich in the refrigerator door or at a level pt can retrieve with reacher.) Therapist encouraged pt to assist with pulling up pants in bed with RUE, as pt is currently dependent Pt had questions about toilet transfers as she is total A currently and being picked up by caregiver. Therapist recommends a scoot transfer with w/c right next to toilet. Pt did not  wish to practice today. Rowing with RUE both forwards and backwards facing, 10 reps with 10 lbs, pt declined use of LUE due to discomfort.                  OT Short Term Goals - 06/04/20 1556      OT SHORT TERM GOAL #1   Title Pt to verbalize understanding with potential A/E needs    Status Achieved      OT SHORT TERM GOAL #2   Title Pt to verbalize understanding with HEP    Status Achieved             OT Long Term Goals - 06/13/20 1453      OT LONG TERM GOAL #1   Title Pt to perform UB dressing and bathing with mod assist and A/E prn    Status Achieved      OT LONG TERM GOAL #2   Title Pt to perform dynamic sitting EOB with close supervision in prep for seated ADLS    Status Achieved                 Plan - 08/08/20 1607    Clinical Impression Statement Pt is progressing towards goals. She is highly motivated to be more I.    OT Occupational Profile and History Problem Focused Assessment -  Including review of records relating to presenting problem    Occupational performance deficits (Please refer to evaluation for details): ADL's;IADL's    Body Structure / Function / Physical Skills ADL;ROM;IADL;Body mechanics;Improper spinal/pelvic alignment;Mobility;Muscle spasms;Flexibility;Tone;Coordination;Pain;UE functional use;Decreased knowledge of use of DME    Rehab Potential Good   for goals set   Clinical Decision Making Several treatment options, min-mod task modification necessary    Comorbidities Affecting Occupational Performance: Presence of comorbidities impacting occupational performance    Comorbidities impacting occupational performance description: spasticity and contractures related to CP    Modification or Assistance to Complete Evaluation  Min-Moderate modification of tasks or assist with assess necessary to complete eval    OT Frequency 1x / week   for 3 weeks (d/t MCD), followed by 2x/wk for up to 4 weeks   OT Treatment/Interventions  Self-care/ADL training;Therapeutic exercise;Functional Mobility Training;Neuromuscular education;Manual Therapy;Splinting;Therapeutic activities;DME and/or AE instruction;Passive range of motion;Patient/family education;Moist Heat    Plan continue to discuss A/E prn, consider toilet transfers, scoot transfer, give info for Meals on Wheels    Consulted and Agree with Plan of Care Patient           Patient will benefit from skilled therapeutic intervention in order to improve the following deficits and impairments:   Body Structure / Function / Physical Skills: ADL, ROM, IADL, Body mechanics, Improper spinal/pelvic alignment, Mobility, Muscle spasms, Flexibility, Tone, Coordination, Pain, UE functional use, Decreased knowledge of use of DME       Visit Diagnosis: Muscle weakness (generalized)  Other symptoms and signs involving the nervous system  Contracture of muscle, multiple sites    Problem List Patient Active Problem List   Diagnosis Date Noted  . Cerebral palsy (HCC) 04/25/2020  . 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 Other acquired deformity of other parts of limb 04/25/2020  . 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 Unspecified deformity of forearm, excluding fingers 04/25/2020  . Urinary incontinence 04/25/2020  . Iron deficiency anemia due to chronic blood loss 07/10/2017    Daylen Hack 08/08/2020, 4:08 PM  [facility] [address] [place], [place], [address] Phone: [phone]   Fax:  [phone]  Name: AKAISHA TRUMAN MRN: [id number] Date of Birth: 26-Oct-1986

## 2020-08-09 ENCOUNTER — Ambulatory Visit: Payer: Medicaid Other | Admitting: Occupational Therapy

## 2020-08-09 ENCOUNTER — Ambulatory Visit: Payer: Medicaid Other

## 2020-08-14 ENCOUNTER — Ambulatory Visit: Payer: Medicaid Other

## 2020-08-16 ENCOUNTER — Ambulatory Visit: Payer: Medicaid Other

## 2020-08-20 ENCOUNTER — Ambulatory Visit: Payer: Medicaid Other | Admitting: Occupational Therapy

## 2020-08-20 ENCOUNTER — Other Ambulatory Visit: Payer: Self-pay

## 2020-08-20 ENCOUNTER — Ambulatory Visit: Payer: Medicaid Other

## 2020-08-20 NOTE — Therapy (Signed)
[place] [address] [place], [place], [address] Phone: [phone]   Fax:  [phone]  Occupational Therapy Treatment  Patient Details  Name: Christine Gomez MRN: [id number] Date of Birth: 1986-09-04 Referring Provider (OT): Everardo Beals, NP   Encounter Date: 08/20/2020   OT End of Session - 08/20/20 1413    Visit Number 7    Number of Visits 12    Date for OT Re-Evaluation 07/28/20    Authorization Type MCD - Awaiting reauthorization    Authorization Time Period 6/14- 07/08/20    Authorization - Visit Number 2    Authorization - Number of Visits 8    OT Start Time 1219    OT Stop Time 1315    OT Time Calculation (min) 40 min    Activity Tolerance Patient tolerated treatment well    Behavior During Therapy [facility] for tasks assessed/performed           Past Medical History:  Diagnosis Date  . Acid reflux   . Cerebral palsy [facility])     Past Surgical History:  Procedure Laterality Date  . dorsal rhizotomy     back ligament looosening surgery  . ESOPHAGOGASTRODUODENOSCOPY (EGD) WITH PROPOFOL N/A 10/08/2016   Procedure: ESOPHAGOGASTRODUODENOSCOPY (EGD) WITH PROPOFOL;  Surgeon: Arta Silence, MD;  Location: [facility];  Service: Endoscopy;  Laterality: N/A;  . left arm surgery      There were no vitals filed for this visit.   Subjective Assessment - 08/20/20 1239    Pertinent History CP, bilateral hip dislocations, dorsal rhizotomy either '95 or '96    Currently in Pain? Yes    Pain Score 3     Pain Location Hip    Pain Orientation Right;Left    Pain Descriptors / Indicators Aching    Pain Type Chronic pain    Pain Onset More than a month ago    Pain Frequency Intermittent    Aggravating Factors  weather           Pt arrived, however pt has switched back to traditional MCD and today's visit has not been preauthorized, therefore did not charge for today's session.  Further discussed toilet  transfers and pt was shown drop arm BSC, however even if she was able to safely transfer using sliding board w/ max assist, determined that pt could not lift bottom enough to have caregiver perform clothes management.  Also discussed Meals on Wheels - pt reports this is only for senior citizens.  Pt shown [employer] shoe horn and how to use. Pt interested in practicing further next session. Pt also interested in LH sponge and rocker knife. Pt has reacher but clamps horizontally as opposed to traditional reacher.                        OT Short Term Goals - 06/04/20 1556      OT SHORT TERM GOAL #1   Title Pt to verbalize understanding with potential A/E needs    Status Achieved      OT SHORT TERM GOAL #2   Title Pt to verbalize understanding with HEP    Status Achieved             OT Long Term Goals - 08/20/20 1415      OT LONG TERM GOAL #1   Title Pt to perform UB dressing and bathing with mod assist and A/E prn    Status Achieved  OT LONG TERM GOAL #2   Title Pt to perform dynamic sitting EOB with close supervision in prep for seated ADLS    Status Achieved      OT LONG TERM GOAL #3   Title Pt to perform sliding board transfers with max assist    Status On-going   continues to fluctuate b/t total and max assist                Plan - 08/20/20 1415    Clinical Impression Statement Pt has met STG's #1 and #2. Pt has met LTG #1 and #2. Pt has progressed towards remaining goal    OT Occupational Profile and History Problem Focused Assessment - Including review of records relating to presenting problem    Occupational performance deficits (Please refer to evaluation for details): ADL's;IADL's    Body Structure / Function / Physical Skills ADL;ROM;IADL;Body mechanics;Improper spinal/pelvic alignment;Mobility;Muscle spasms;Flexibility;Tone;Coordination;Pain;UE functional use;Decreased knowledge of use of DME    Rehab Potential Good   for goals set   Clinical  Decision Making Several treatment options, min-mod task modification necessary    Comorbidities Affecting Occupational Performance: Presence of comorbidities impacting occupational performance    Comorbidities impacting occupational performance description: spasticity and contractures related to CP    Modification or Assistance to Complete Evaluation  Min-Moderate modification of tasks or assist with assess necessary to complete eval    OT Frequency 2x / week    OT Duration --   for 1 week, followed by 1x/wk for 1 week to use remaining yearly MCD visits   OT Treatment/Interventions Self-care/ADL training;Therapeutic exercise;Functional Mobility Training;Neuromuscular education;Manual Therapy;Splinting;Therapeutic activities;DME and/or AE instruction;Passive range of motion;Patient/family education;Moist Heat    Plan continue to discuss A/E prn, practice sliding board transfers to decrease dependence on caregiver   Consulted and Agree with Plan of Care Patient           Patient will benefit from skilled therapeutic intervention in order to improve the following deficits and impairments:   Body Structure / Function / Physical Skills: ADL, ROM, IADL, Body mechanics, Improper spinal/pelvic alignment, Mobility, Muscle spasms, Flexibility, Tone, Coordination, Pain, UE functional use, Decreased knowledge of use of DME       Visit Diagnosis: Other symptoms and signs involving the nervous system  Muscle weakness (generalized)  Abnormal posture    Problem List Patient Active Problem List   Diagnosis Date Noted  . Cerebral palsy ([place]) 04/25/2020  . 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 Other acquired deformity of other parts of limb 04/25/2020  . 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 Unspecified deformity of forearm, excluding fingers 04/25/2020  . Urinary incontinence 04/25/2020  . Iron deficiency anemia due  to chronic blood loss 07/10/2017    Carey Bullocks, OTR/L 08/20/2020, 2:24 PM  [place] [address] [place], [place], [address] Phone: [phone]   Fax:  [phone]  Name: Christine Gomez MRN: [id number] Date of Birth: 04-19-1986

## 2020-08-22 ENCOUNTER — Ambulatory Visit: Payer: Medicaid Other | Admitting: Occupational Therapy

## 2020-08-22 ENCOUNTER — Other Ambulatory Visit: Payer: Self-pay

## 2020-08-22 ENCOUNTER — Ambulatory Visit: Payer: Medicaid Other

## 2020-08-22 DIAGNOSIS — R2689 Other abnormalities of gait and mobility: Secondary | ICD-10-CM

## 2020-08-22 DIAGNOSIS — M6281 Muscle weakness (generalized): Secondary | ICD-10-CM

## 2020-08-22 DIAGNOSIS — G800 Spastic quadriplegic cerebral palsy: Secondary | ICD-10-CM

## 2020-08-22 DIAGNOSIS — R29818 Other symptoms and signs involving the nervous system: Secondary | ICD-10-CM

## 2020-08-22 NOTE — Therapy (Signed)
[facility] [address] [place], [place], [address] Phone: [phone]   Fax:  [phone]  Physical Therapy Treatment  Patient Details  Name: Christine Gomez MRN: [id number] Date of Birth: 07-Nov-1986 Referring Provider (PT): Marva Panda   Encounter Date: 08/22/2020   PT End of Session - 08/22/20 1413    Visit Number 15    Number of Visits 16    Authorization Type Medicaid    Authorization Time Period 10 Visits (05/17/2020 - 07/11/2020), 4 visits (7/19 - 8/16    Authorization - Visit Number 14    Authorization - Number of Visits 15    Progress Note Due on Visit 10    PT Start Time 1401    PT Stop Time 1445    PT Time Calculation (min) 44 min    Equipment Utilized During Treatment Gait belt    Activity Tolerance Patient tolerated treatment well    Behavior During Therapy WFL for tasks assessed/performed           Past Medical History:  Diagnosis Date  . Acid reflux   . Cerebral palsy [facility])     Past Surgical History:  Procedure Laterality Date  . dorsal rhizotomy     back ligament looosening surgery  . ESOPHAGOGASTRODUODENOSCOPY (EGD) WITH PROPOFOL N/A 10/08/2016   Procedure: ESOPHAGOGASTRODUODENOSCOPY (EGD) WITH PROPOFOL;  Surgeon: Willis Modena, MD;  [facility];  Service: Endoscopy;  Laterality: N/A;  . left arm surgery      There were no vitals filed for this visit.   Subjective Assessment - 08/22/20 1415    Subjective Patient reports increased stiffness today. Patient reports stretching daily. Reports sliding board transfer is going well when completing with friend.    Pertinent History CP, microcytic hyochromic anemia, dorsal rhizotomy 1995, bilateral hip dislocation right after that.    Patient Stated Goals To be able to transfer easier.    Currently in Pain? No/denies    Pain Onset More than a month ago                             [facility] Adult PT  Treatment/Exercise - 08/22/20 0001      Transfers   Transfers Lateral/Scoot Transfers    Lateral/Scoot Transfers 3: Mod assist    Lateral/Scoot Transfer Details (indicate cue type and reason) completed w/c <> mat transfer with sliding board. PT providing Mod A wtih lateral scoot and Max A for leg adjustment with completion. PT completed placement of sliding board transfer to allow for completion.       Self-Care   Self-Care Other Self-Care Comments    Other Self-Care Comments  Patient reporting difficulty completing transfer into personal bed due to height of bed, with PT providing education to raise power chair to level with bed if able and transfer from that height. Due to being elevated unable to utilize foot stool and would require more assistance from caregiver/friend at home but would allow for improved safety of completion. Patient also reporting that friend has been completing sliding board transfers at this time, but caregiver is hesistant to complete transfer and has not been utilzing sliding board. PT educating on impportnace of use of sliding board to promote independence and reduce caregiver assitance.       Therapeutic Activites    Therapeutic Activities Other Therapeutic Activities    Other Therapeutic Activities In // bars completed standing with Max A from PT and  Mod A from PT tech. Verbal cues for improved upright posture with completion and knee extension. Flexed posture tue to tightness contractures. Completed standing 3 x 30-45 seconds with extended rest breaks between sets due to fatigue.                     PT Short Term Goals - 07/04/20 0909      PT SHORT TERM GOAL #1   Title Pt will be able to perform initial HEP for flexibility/ROM, strengthening with assist of aides for improved mobility.    Baseline Reports completing HEP with assist of aide/friend 3-4 times week    Time 3    Period Weeks    Status On-going      PT SHORT TERM GOAL #2   Title Patient  will demonstrate ability to stand in parallel bars with Max Assist for 1 minute.    Baseline <1 minute    Time 3    Period Weeks    Status Revised      PT SHORT TERM GOAL #3   Title Pt will be able to perform bed mobility mod assist for improved function.    Baseline Patient still require Max A for supine to sit    Time 3    Period Weeks    Status Revised             PT Long Term Goals - 07/03/20 1429      PT LONG TERM GOAL #1   Title Pt will be independent with progress strengthening, flexibility and mobility program for HEP to continue on own. ALL LTG due at end of Medicaid Auth (5 Weeks)    Baseline Patient currently completing HEP with assistance approx 3x/week, continue to progress as tolerated    Time 5    Period Weeks    Status On-going      PT LONG TERM GOAL #2   Title Pt will be able to perform slideboard transfer versus squat/pivot mod assist for improved ability to participate with transfers.    Baseline able to complete slideboard with varying Mod - Max A.    Time 5    Period Weeks    Status On-going      PT LONG TERM GOAL #3   Title Pt will be able to stand in // bars with Mod A and UE support for 1 minutes for improved functional strength and assist with ADLs.    Baseline Patient able to stand in // bars with max A from PT on avg 45 seconds.    Time 5    Period Weeks    Status Revised      PT LONG TERM GOAL #4   Title Pt will increase knee extension ROM >5 degrees for improved standing ability.    Baseline right=-14 and left=-30 degrees passively (not assessed today, but still demo significant limitations in ROM)    Time 5    Period Weeks    Status On-going                 Plan - 08/22/20 1635    Clinical Impression Statement Today's session included task specific training of sliding board transfer, patient able to complete with Mod A from PT. Also continued standing tolerance activity in // bars, patient able to stand 30-45 seconds with Max A  from PT. Will continue to progress, plan to discharge at end of POC due to utilized all medicaid visits.    Personal Factors  and Comorbidities Comorbidity 2    Comorbidities microcytic hypochromic anemia, history of dorsal rhizotomy    Examination-Activity Limitations Transfers;Bed Mobility;Toileting    Examination-Participation Restrictions Interpersonal Relationship    Stability/Clinical Decision Making Stable/Uncomplicated    Rehab Potential Good    PT Frequency 1x / week    PT Duration --   5 weeks, pending medicaid auth   PT Treatment/Interventions ADLs/Self Care Home Management;Functional mobility training;Therapeutic activities;DME Instruction;Neuromuscular re-education;Balance training;Therapeutic exercise;Gait training;Patient/family education;Manual techniques;Passive range of motion    PT Next Visit Plan Check LTG and Discharge    Consulted and Agree with Plan of Care Patient           Patient will benefit from skilled therapeutic intervention in order to improve the following deficits and impairments:  Decreased balance, Decreased mobility, Decreased range of motion, Decreased knowledge of use of DME, Decreased strength, Impaired tone, Impaired flexibility, Postural dysfunction, Impaired UE functional use  Visit Diagnosis: Other symptoms and signs involving the nervous system  Muscle weakness (generalized)  Spastic quadriplegic cerebral palsy (HCC)  Other abnormalities of gait and mobility     Problem List Patient Active Problem List   Diagnosis Date Noted  . Cerebral palsy (HCC) 04/25/2020  . 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 Other acquired deformity of other parts of limb 04/25/2020  . 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 Unspecified deformity of forearm, excluding fingers 04/25/2020  . Urinary incontinence 04/25/2020  . Iron deficiency anemia due to chronic blood loss  07/10/2017    Tempie Donning, PT, DPT 08/22/2020, 4:39 PM  [facility] [address] [place], [place], [address] Phone: [phone]   Fax:  [phone]  Name: Christine Gomez MRN: [id number] Date of Birth: 13-Jul-1986

## 2020-08-28 ENCOUNTER — Encounter: Payer: Self-pay | Admitting: Occupational Therapy

## 2020-08-28 ENCOUNTER — Ambulatory Visit: Payer: Medicaid Other | Admitting: Occupational Therapy

## 2020-08-28 ENCOUNTER — Other Ambulatory Visit: Payer: Self-pay

## 2020-08-28 ENCOUNTER — Ambulatory Visit: Payer: Medicaid Other

## 2020-08-28 DIAGNOSIS — G800 Spastic quadriplegic cerebral palsy: Secondary | ICD-10-CM

## 2020-08-28 DIAGNOSIS — R2689 Other abnormalities of gait and mobility: Secondary | ICD-10-CM

## 2020-08-28 DIAGNOSIS — R29818 Other symptoms and signs involving the nervous system: Secondary | ICD-10-CM

## 2020-08-28 DIAGNOSIS — R293 Abnormal posture: Secondary | ICD-10-CM

## 2020-08-28 DIAGNOSIS — M6281 Muscle weakness (generalized): Secondary | ICD-10-CM | POA: Diagnosis not present

## 2020-08-28 NOTE — Therapy (Signed)
[facility] [address] [place], [place], [address] Phone: [phone]   Fax:  [phone]  Physical Therapy Treatment/Discharge Summary  Patient Details  Name: Christine Gomez MRN: [id number] Date of Birth: 1986-09-26 Referring Provider (PT): Everardo Beals  PHYSICAL THERAPY DISCHARGE SUMMARY  Visits from Start of Care: 16  Current functional level related to goals / functional outcomes: See clinical impression and goals. Pt requires min-max assist with transfers, max assist with bed mobility.    Remaining deficits: Tone/spasticity, weakness   Education / Equipment: HEP  Plan: Patient agrees to discharge.  Patient goals were not met. Patient is being discharged due to financial reasons.  ?????       Encounter Date: 08/28/2020   PT End of Session - 08/28/20 1535    Visit Number 16    Number of Visits 16    Authorization Type Medicaid    Authorization Time Period 10 Visits (05/17/2020 - 07/11/2020), 4 visits (7/19 - 8/16    Authorization - Visit Number 14    Authorization - Number of Visits 15    Progress Note Due on Visit 10    PT Start Time 5364    PT Stop Time 6803    PT Time Calculation (min) 39 min    Equipment Utilized During Treatment Gait belt    Activity Tolerance Patient tolerated treatment well    Behavior During Therapy WFL for tasks assessed/performed           Past Medical History:  Diagnosis Date  . Acid reflux   . Cerebral palsy [facility])     Past Surgical History:  Procedure Laterality Date  . dorsal rhizotomy     back ligament looosening surgery  . ESOPHAGOGASTRODUODENOSCOPY (EGD) WITH PROPOFOL N/A 10/08/2016   Procedure: ESOPHAGOGASTRODUODENOSCOPY (EGD) WITH PROPOFOL;  Surgeon: Arta Silence, MD;  Location: [facility];  Service: Endoscopy;  Laterality: N/A;  . left arm surgery      There were no vitals filed for this visit.   Subjective Assessment - 08/28/20 1534     Subjective Pt reports that she is doing well. Has not been able to stretch as much as girl who does that is on a cruise.    Pertinent History CP, microcytic hyochromic anemia, dorsal rhizotomy 1995, bilateral hip dislocation right after that.    Patient Stated Goals To be able to transfer easier.    Currently in Pain? No/denies    Pain Onset More than a month ago                             [facility] Adult PT Treatment/Exercise - 08/28/20 1555      Bed Mobility   Bed Mobility Supine to Sit;Sit to Supine    Supine to Sit Maximal Assistance - Patient - Patient 25-49%   at legs with pulling on PT arm and cues to push thru elbow   Sit to Supine Maximal Assistance - Patient 25-49%   at legs     Transfers   Transfers Lateral/Scoot Transfers    Lateral/Scoot Transfers 4: Min assist;3: Mod assist    Lateral/Scoot Transfer Details (indicate cue type and reason) mat to w/c to the right. Pt able to lean away to allow PT to place board then performed transfer min assist    Comments Pt stood in // bars max assist to rise with PT blocking legs x 3 bouts about 15-20 sec each with 2nd  therapist CGA for safety. Pt with flexed posture.      Exercises   Exercises Other Exercises    Other Exercises  PT performed and reviewed HEP with patient as described below.           Exercises Supine Ankle Dorsiflexion Stretch with Caregiver - 2 x daily - 7 x weekly - 3 sets - 30 hold  -performed 30 sec x 2 bilateral Supine Hamstring Stretch with Caregiver - 2 x daily - 7 x weekly - 3 sets - 30 hold  -performed 30 sec x 2 bilateral Hooklying Gluteal Sets - 2 x daily - 7 x weekly - 2 sets - 10 reps  -performed x 10 with PT stabilizing feet Bent Knee Fallouts - 1 x daily - 7 x weekly - 2 sets - 10 reps  -performed 30 sec x 2 Hip and Knee Extension and Flexion Caregiver PROM - 2 x daily - 7 x weekly - 1 sets - 10 reps  -performed x 10. Pt cued to try to help with motions as much as  possible          PT Education - 08/28/20 1710    Education Details Discussed d/c plan to as out of medicaid visits. Pt to continue with HEP and trying to perform slideboard transfers with aides at home. Plans to return in January.    Person(s) Educated Patient    Methods Explanation    Comprehension Verbalized understanding            PT Short Term Goals - 07/04/20 0909      PT SHORT TERM GOAL #1   Title Pt will be able to perform initial HEP for flexibility/ROM, strengthening with assist of aides for improved mobility.    Baseline Reports completing HEP with assist of aide/friend 3-4 times week    Time 3    Period Weeks    Status On-going      PT SHORT TERM GOAL #2   Title Patient will demonstrate ability to stand in parallel bars with Max Assist for 1 minute.    Baseline <1 minute    Time 3    Period Weeks    Status Revised      PT SHORT TERM GOAL #3   Title Pt will be able to perform bed mobility mod assist for improved function.    Baseline Patient still require Max A for supine to sit    Time 3    Period Weeks    Status Revised             PT Long Term Goals - 08/28/20 1548      PT LONG TERM GOAL #1   Title Pt will be independent with progress strengthening, flexibility and mobility program for HEP to continue on own. ALL LTG due at end of Medicaid Auth (5 Weeks)    Baseline Patient currently completing HEP with assistance approx 3x/week,    Time 5    Period Weeks    Status Achieved      PT LONG TERM GOAL #2   Title Pt will be able to perform slideboard transfer versus squat/pivot mod assist for improved ability to participate with transfers.    Baseline Pt's ability to perform slideboard has varied but at visit today on 08/28/20 performed min assist going to right. Varies based on direction and transfer surface.    Time 5    Period Weeks    Status Partially Met  PT LONG TERM GOAL #3   Title Pt will be able to stand in // bars with Mod A and UE  support for 1 minutes for improved functional strength and assist with ADLs.    Baseline Pt able to stand ~20 sec max assist in // bars    Time 5    Period Weeks    Status Not Met      PT LONG TERM GOAL #4   Title Pt will increase knee extension ROM >5 degrees for improved standing ability.    Baseline right=-14 and left=-30 degrees passively. 08/28/20 right knee=-12, left=-20. Met on left, not on right    Time 5    Period Weeks    Status Partially Met                 Plan - 08/28/20 1713    Clinical Impression Statement Pt has shown progress over course of therapy with decreasing amount of assistance needed with slideboard transfers. She has started to perform this at times with friend at home but current aide will not attempt. She is able to perform her HEP with assistance of aide at home. Has shown some improvement in knee extension ROM and she plans to discuss with MD about trying to pursue more botox injections. She has run out of medicaid visits for the year so will take a break from therapy and plans to return next year.    Personal Factors and Comorbidities Comorbidity 2    Comorbidities microcytic hypochromic anemia, history of dorsal rhizotomy    Examination-Activity Limitations Transfers;Bed Mobility;Toileting    Examination-Participation Restrictions Interpersonal Relationship    Stability/Clinical Decision Making Stable/Uncomplicated    Rehab Potential Good    PT Frequency 1x / week    PT Duration --   5 weeks, pending medicaid auth   PT Treatment/Interventions ADLs/Self Care Home Management;Functional mobility training;Therapeutic activities;DME Instruction;Neuromuscular re-education;Balance training;Therapeutic exercise;Gait training;Patient/family education;Manual techniques;Passive range of motion    PT Next Visit Plan Discharge today.    Consulted and Agree with Plan of Care Patient           Patient will benefit from skilled therapeutic intervention in order  to improve the following deficits and impairments:  Decreased balance, Decreased mobility, Decreased range of motion, Decreased knowledge of use of DME, Decreased strength, Impaired tone, Impaired flexibility, Postural dysfunction, Impaired UE functional use  Visit Diagnosis: Other abnormalities of gait and mobility  Muscle weakness (generalized)  Abnormal posture     Problem List Patient Active Problem List   Diagnosis Date Noted  . Cerebral palsy ([place]) 04/25/2020  . 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 Other acquired deformity of other parts of limb 04/25/2020  . 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 Unspecified deformity of forearm, excluding fingers 04/25/2020  . Urinary incontinence 04/25/2020  . Iron deficiency anemia due to chronic blood loss 07/10/2017    Electa Sniff, PT, DPT, NCS 08/28/2020, 5:16 PM  [facility] [address] [place], [place], [address] Phone: [phone]   Fax:  [phone]  Name: Christine Gomez MRN: [id number] Date of Birth: 1986/08/09

## 2020-08-28 NOTE — Therapy (Signed)
[facility] [address] [place], [place], [address] Phone: [phone]   Fax:  [phone]  Occupational Therapy Treatment  Patient Details  Name: Christine Gomez MRN: [id number] Date of Birth: [birth year] Referring Provider (OT): Marva Panda, NP   Encounter Date: 08/28/2020   OT End of Session - 08/28/20 1547    Visit Number 8    Number of Visits 12    Authorization Type MCD - Awaiting reauthorization    Authorization - Visit Number 3    Authorization - Number of Visits 8    OT Start Time 1445    OT Stop Time 1530    OT Time Calculation (min) 45 min    Activity Tolerance Patient tolerated treatment well    Behavior During Therapy [facility] for tasks assessed/performed           Past Medical History:  Diagnosis Date  . Acid reflux   . Cerebral palsy [facility])     Past Surgical History:  Procedure Laterality Date  . dorsal rhizotomy     back ligament looosening surgery  . ESOPHAGOGASTRODUODENOSCOPY (EGD) WITH PROPOFOL N/A 10/08/2016   Procedure: ESOPHAGOGASTRODUODENOSCOPY (EGD) WITH PROPOFOL;  Surgeon: Willis Modena, MD;  [facility];  Service: Endoscopy;  Laterality: N/A;  . left arm surgery      There were no vitals filed for this visit.   Subjective Assessment - 08/28/20 1537    Subjective  I think i have one more session after this one    Pertinent History CP, bilateral hip dislocations, dorsal rhizotomy either '95 or '96    Currently in Pain? No/denies    Pain Score 0-No pain                        OT Treatments/Exercises (OP) - 08/28/20 0001      ADLs   Functional Mobility Working on level surface or slightly uphill transfers toward left and right.  Patient now able to confidently direct steps of transfers as she is now transferring with caregivers.  Focus today on once lifting off surface to weight shift through hips to direct left and right directional transfers.  Patient  is on track to meet all goals.                    OT Education - 08/28/20 1546    Education Details head hip relationship with scooting aspect of transfers    Person(s) Educated Patient    Methods Explanation;Demonstration;Tactile cues;Verbal cues    Comprehension Verbalized understanding;Returned demonstration;Need further instruction            OT Short Term Goals - 08/28/20 1525      OT SHORT TERM GOAL #1   Title Pt to verbalize understanding with potential A/E needs    Baseline not yet issued    Time 3    Period Weeks    Status Achieved      OT SHORT TERM GOAL #2   Title Pt to verbalize understanding with HEP    Baseline not yet issued    Time 3    Period Weeks    Status Achieved             OT Long Term Goals - 08/28/20 1526      OT LONG TERM GOAL #1   Title Pt to perform UB dressing and bathing with mod assist and A/E prn    Baseline total assist/dependent  Time 7    Period Weeks    Status Achieved      OT LONG TERM GOAL #2   Title Pt to perform dynamic sitting EOB with close supervision in prep for seated ADLS    Baseline static only with hand support    Time 7    Period Weeks    Status Achieved      OT LONG TERM GOAL #3   Title Pt to perform sliding board transfers with max assist    Baseline total assist    Time 7                 Plan - 08/28/20 1548    Clinical Impression Statement Pt continues to progress with participation with functional transfers    OT Occupational Profile and History Problem Focused Assessment - Including review of records relating to presenting problem    Occupational performance deficits (Please refer to evaluation for details): ADL's;IADL's    Body Structure / Function / Physical Skills ADL;ROM;IADL;Body mechanics;Improper spinal/pelvic alignment;Mobility;Muscle spasms;Flexibility;Tone;Coordination;Pain;UE functional use;Decreased knowledge of use of DME    Rehab Potential Good   for goals set    Clinical Decision Making Several treatment options, min-mod task modification necessary    Comorbidities Affecting Occupational Performance: Presence of comorbidities impacting occupational performance    Comorbidities impacting occupational performance description: spasticity and contractures related to CP    Modification or Assistance to Complete Evaluation  Min-Moderate modification of tasks or assist with assess necessary to complete eval    OT Frequency 2x / week    OT Duration --   for 1 week, followed by 1x/wk for 1 week to use remaining yearly MCD visits   OT Treatment/Interventions Self-care/ADL training;Therapeutic exercise;Functional Mobility Training;Neuromuscular education;Manual Therapy;Splinting;Therapeutic activities;DME and/or AE instruction;Passive range of motion;Patient/family education;Moist Heat    Plan continue to discuss A/E prn, consider toilet transfers, scoot transfer, give info for Meals on Wheels    Consulted and Agree with Plan of Care Patient           Patient will benefit from skilled therapeutic intervention in order to improve the following deficits and impairments:   Body Structure / Function / Physical Skills: ADL, ROM, IADL, Body mechanics, Improper spinal/pelvic alignment, Mobility, Muscle spasms, Flexibility, Tone, Coordination, Pain, UE functional use, Decreased knowledge of use of DME       Visit Diagnosis: Other symptoms and signs involving the nervous system  Muscle weakness (generalized)  Spastic quadriplegic cerebral palsy (HCC)  Abnormal posture    Problem List Patient Active Problem List   Diagnosis Date Noted  . Cerebral palsy (HCC) 04/25/2020  . 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 Other acquired deformity of other parts of limb 04/25/2020  . 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 Unspecified deformity of forearm, excluding fingers 04/25/2020  .  Urinary incontinence 04/25/2020  . Iron deficiency anemia due to chronic blood loss 07/10/2017    Collier Salina 08/28/2020, 3:50 PM  [facility] [address] [place], [place], [address] Phone: [phone]   Fax:  [phone]  Name: Christine Gomez MRN: [id number] Date of Birth: 08-13-1986

## 2020-08-30 ENCOUNTER — Ambulatory Visit: Payer: Medicaid Other | Attending: *Deleted | Admitting: Occupational Therapy

## 2020-08-30 ENCOUNTER — Encounter: Payer: Self-pay | Admitting: Occupational Therapy

## 2020-08-30 ENCOUNTER — Other Ambulatory Visit: Payer: Self-pay

## 2020-08-30 DIAGNOSIS — R293 Abnormal posture: Secondary | ICD-10-CM | POA: Diagnosis not present

## 2020-08-30 DIAGNOSIS — M6281 Muscle weakness (generalized): Secondary | ICD-10-CM | POA: Diagnosis present

## 2020-08-30 DIAGNOSIS — G800 Spastic quadriplegic cerebral palsy: Secondary | ICD-10-CM | POA: Diagnosis present

## 2020-08-30 DIAGNOSIS — R29818 Other symptoms and signs involving the nervous system: Secondary | ICD-10-CM | POA: Insufficient documentation

## 2020-08-30 NOTE — Therapy (Signed)
[facility] [address] [place], [place], [address] Phone: [phone]   Fax:  [phone]  Occupational Therapy Treatment  Patient Details  Name: Christine Gomez MRN: [id number] Date of Birth: 16-Aug-1986 Referring Provider (OT): Everardo Beals, NP   Encounter Date: 08/30/2020   OT End of Session - 08/30/20 1444    Visit Number 9    Number of Visits 12    OT Start Time 1400    OT Stop Time 1440    OT Time Calculation (min) 40 min    Activity Tolerance Patient tolerated treatment well    Behavior During Therapy [facility] for tasks assessed/performed           Past Medical History:  Diagnosis Date  . Acid reflux   . Cerebral palsy [facility])     Past Surgical History:  Procedure Laterality Date  . dorsal rhizotomy     back ligament looosening surgery  . ESOPHAGOGASTRODUODENOSCOPY (EGD) WITH PROPOFOL N/A 10/08/2016   Procedure: ESOPHAGOGASTRODUODENOSCOPY (EGD) WITH PROPOFOL;  Surgeon: Arta Silence, MD;  [facility];  Service: Endoscopy;  Laterality: N/A;  . left arm surgery      There were no vitals filed for this visit.   Subjective Assessment - 08/30/20 1415    Subjective  I got myself in and out of bed like we practiced    Pertinent History CP, bilateral hip dislocations, dorsal rhizotomy either '95 or '96    Currently in Pain? No/denies    Pain Score 0-No pain                        OT Treatments/Exercises (OP) - 08/30/20 0001      ADLs   Functional Mobility Reviewed required components for level surface transfers with emphasis on weigfht shift forward, use of legs for lift off, and weight shift left, right.  ALso worked on significant lateral lean to scoot self back into wheelchair.  Patient much more particiaptive ina ctive transfers at this time, and attempting to carryover at home.  Patient agreeable to discharge at this time, and hopes to restart next calendar year.                        OT Short Term Goals - 08/30/20 1443      OT SHORT TERM GOAL #1   Title Pt to verbalize understanding with potential A/E needs    Baseline not yet issued    Time 3    Period Weeks    Status Achieved      OT SHORT TERM GOAL #2   Title Pt to verbalize understanding with HEP    Baseline not yet issued    Time 3    Period Weeks    Status Achieved             OT Long Term Goals - 08/30/20 1443      OT LONG TERM GOAL #1   Title Pt to perform UB dressing and bathing with mod assist and A/E prn    Baseline total assist/dependent    Time 7    Period Weeks    Status Achieved      OT LONG TERM GOAL #2   Title Pt to perform dynamic sitting EOB with close supervision in prep for seated ADLS    Baseline static only with hand support    Time 7    Period Weeks  Status Achieved      OT LONG TERM GOAL #3   Title Pt to perform sliding board transfers with max assist    Baseline total assist    Time 7    Status Achieved                 Plan - 08/30/20 1442    Clinical Impression Statement Patient has met all goals and is ready for OT discharge.    OT Occupational Profile and History Problem Focused Assessment - Including review of records relating to presenting problem    Occupational performance deficits (Please refer to evaluation for details): ADL's;IADL's    Body Structure / Function / Physical Skills ADL;ROM;IADL;Body mechanics;Improper spinal/pelvic alignment;Mobility;Muscle spasms;Flexibility;Tone;Coordination;Pain;UE functional use;Decreased knowledge of use of DME    Rehab Potential Good   for goals set   Clinical Decision Making Several treatment options, min-mod task modification necessary    Comorbidities Affecting Occupational Performance: Presence of comorbidities impacting occupational performance    Comorbidities impacting occupational performance description: spasticity and contractures related to CP    Modification or Assistance to  Complete Evaluation  Min-Moderate modification of tasks or assist with assess necessary to complete eval    OT Frequency 2x / week    OT Duration --   for 1 week, followed by 1x/wk for 1 week to use remaining yearly MCD visits   OT Treatment/Interventions Self-care/ADL training;Therapeutic exercise;Functional Mobility Training;Neuromuscular education;Manual Therapy;Splinting;Therapeutic activities;DME and/or AE instruction;Passive range of motion;Patient/family education;Moist Heat    Plan discharge    Consulted and Agree with Plan of Care Patient           Patient will benefit from skilled therapeutic intervention in order to improve the following deficits and impairments:   Body Structure / Function / Physical Skills: ADL, ROM, IADL, Body mechanics, Improper spinal/pelvic alignment, Mobility, Muscle spasms, Flexibility, Tone, Coordination, Pain, UE functional use, Decreased knowledge of use of DME       Visit Diagnosis: Abnormal posture  Spastic quadriplegic cerebral palsy (HCC)  Muscle weakness (generalized)  Other symptoms and signs involving the nervous system    Problem List Patient Active Problem List   Diagnosis Date Noted  . Cerebral palsy ([place]) 04/25/2020  . 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 Other acquired deformity of other parts of limb 04/25/2020  . 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 Unspecified deformity of forearm, excluding fingers 04/25/2020  . Urinary incontinence 04/25/2020  . Iron deficiency anemia due to chronic blood loss 07/10/2017   OCCUPATIONAL THERAPY DISCHARGE SUMMARY  Visits from Start of Care: 9  Current functional level related to goals / functional outcomes: Actively assiting with bathing, dressing, bed transfers   Remaining deficits: Spastic quadriplegia   Education / Equipment: HEP, adaptive equipment  Plan: Patient agrees to discharge.   Patient goals were met. Patient is being discharged due to meeting the stated rehab goals.  ?????     Mariah Milling, OTR/L 08/30/2020, 2:45 PM  [facility] [address] [place], [place], [address] Phone: [phone]   Fax:  [phone]  Name: Christine Gomez MRN: [id number] Date of Birth: November 17, 1986

## 2020-10-22 ENCOUNTER — Encounter: Payer: Self-pay | Admitting: Podiatry

## 2020-10-22 ENCOUNTER — Ambulatory Visit (INDEPENDENT_AMBULATORY_CARE_PROVIDER_SITE_OTHER): Payer: Medicaid Other | Admitting: Podiatry

## 2020-10-22 ENCOUNTER — Other Ambulatory Visit: Payer: Self-pay

## 2020-10-22 DIAGNOSIS — M79676 Pain in unspecified toe(s): Secondary | ICD-10-CM

## 2020-10-22 DIAGNOSIS — M79609 Pain in unspecified limb: Secondary | ICD-10-CM | POA: Diagnosis not present

## 2020-10-22 DIAGNOSIS — B351 Tinea unguium: Secondary | ICD-10-CM | POA: Diagnosis not present

## 2020-10-26 ENCOUNTER — Ambulatory Visit: Payer: Medicaid Other | Admitting: Podiatry

## 2020-10-27 NOTE — Progress Notes (Signed)
Subjective: Christine Gomez is a 34 y.o. female patient seen today painful mycotic nails b/l that are difficult to trim. Pain interferes with ambulation. Aggravating factors include wearing enclosed shoe gear. Pain is relieved with periodic professional debridement   She continues to use the ompounded topical antifungal solution from [place]. She voices no new pedal concerns on today's visit.  She states she is still in grad school and very busy with that.  Patient Active Problem List   Diagnosis Date Noted  . Cerebral palsy (HCC) 04/25/2020  . 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 Other acquired deformity of other parts of limb 04/25/2020  . 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 Unspecified deformity of forearm, excluding fingers 04/25/2020  . Urinary incontinence 04/25/2020  . Iron deficiency anemia due to chronic blood loss 07/10/2017    Current Outpatient Medications on File Prior to Visit  Medication Sig Dispense Refill  . diclofenac (VOLTAREN) 75 MG EC tablet diclofenac sodium 75 mg tablet,delayed release  Take 1 tablet twice a day by oral route for 30 days.    Marland Kitchen docusate sodium (COLACE) 100 MG capsule Colace 100 mg capsule  Take 1 capsule every day by oral route as needed for 30 days.    Marland Kitchen loratadine (CLARITIN) 10 MG tablet Take 10 mg by mouth daily as needed for allergies.    . naproxen sodium (ALEVE) 220 MG tablet Take 440 mg by mouth daily as needed (fever).    . NON FORMULARY Antifungal solution for toenails sent to [facility], faxed 07/20/2019 HS-CMA    . omeprazole (PRILOSEC) 20 MG capsule Take 20 mg by mouth 2 (two) times daily.    . ondansetron (ZOFRAN-ODT) 4 MG disintegrating tablet ondansetron 4 mg disintegrating tablet  TK 1 T PO Q 4 H PRN FOR NAUSEA/VOMITING    . pantoprazole (PROTONIX) 40 MG tablet pantoprazole 40 mg tablet,delayed release  TAKE 1 TABLET BY MOUTH  EVERY DAY     No current facility-administered medications on file prior to visit.    No Known Allergies  Objective: Physical Exam  General: Well developed, nourished, no acute distress, awake, alert and oriented x 3  Vascular:  Dorsalis pedis artery 2/4 bilateral. Posterior tibial artery 2/4 bilateral. Skin temperature gradient warm to warm proximal to distal bilateral lower extremities. No varicosities b/l. Pedal hair present bilateral.  Neurological: Gross sensation present via light touch bilateral.   Dermatological: Skin is warm, dry, and supple bilateral. Nails 2-5 b/l and left hallux are tender, long, thick, and discolored with subungal debris. No interdigital macerations present bilateral. No open lesions present bilateral. No callus/corns/hyperkeratotic tissue present bilateral. No signs of infection bilateral.  There is noted onchyolysis of entire nailplate of the right great toe.  The nailbed remains intact. There is no erythema, no edema, no drainage, no underlying flocculence.  Musculoskeletal: No symptomatic bony deformities noted bilateral. Disuse atrophy b/l LE.  No pain with calf compression bilateral. She utilized a motorized chair for mobility.  Assessment and Plan:  Problem List Items Addressed This Visit    None    Visit Diagnoses    Pain due to onychomycosis of nail    -  Primary     -Examined patient.  -Discussed treatment options for painful mycotic nails. -Toenails left hallux and 2-5 b/l were debrided in length and girth with sterile nail nippers and dremel without iatrogenic bleeding. She was instructed to decrease use of compounded  topical antifungal solution to twice weekly. -Patient to continue soft, supportive shoe gear daily. -Patient to report any pedal injuries to medical professional immediately. -Patient/POA to call should there be question/concern in the interim.  Return in about 3 months (around 01/22/2021) for 3 month toenail  debridement.  Freddie Breech, DPM

## 2021-01-30 ENCOUNTER — Ambulatory Visit: Payer: Medicaid Other | Admitting: Podiatry

## 2021-03-06 ENCOUNTER — Ambulatory Visit: Payer: Medicaid Other | Admitting: Occupational Therapy

## 2021-03-06 ENCOUNTER — Ambulatory Visit: Payer: Medicaid Other

## 2021-03-26 ENCOUNTER — Ambulatory Visit: Payer: Medicaid Other

## 2021-03-26 ENCOUNTER — Other Ambulatory Visit: Payer: Self-pay

## 2021-03-26 ENCOUNTER — Encounter: Payer: Self-pay | Admitting: Occupational Therapy

## 2021-03-26 ENCOUNTER — Ambulatory Visit: Payer: Medicaid Other | Attending: *Deleted | Admitting: Occupational Therapy

## 2021-03-26 DIAGNOSIS — M25642 Stiffness of left hand, not elsewhere classified: Secondary | ICD-10-CM | POA: Insufficient documentation

## 2021-03-26 DIAGNOSIS — R29818 Other symptoms and signs involving the nervous system: Secondary | ICD-10-CM | POA: Diagnosis present

## 2021-03-26 DIAGNOSIS — R293 Abnormal posture: Secondary | ICD-10-CM | POA: Diagnosis not present

## 2021-03-26 DIAGNOSIS — M6281 Muscle weakness (generalized): Secondary | ICD-10-CM | POA: Insufficient documentation

## 2021-03-26 DIAGNOSIS — M25632 Stiffness of left wrist, not elsewhere classified: Secondary | ICD-10-CM | POA: Insufficient documentation

## 2021-03-26 NOTE — Therapy (Signed)
[facility] [address] [place], [place], [address] Phone: [phone]   Fax:  [phone]  Occupational Therapy Evaluation  Patient Details  Name: Christine Gomez MRN: [id number] Date of Birth: [birth year] Referring Provider (OT): Marva Panda, NP   Encounter Date: 03/26/2021   OT End of Session - 03/26/21 1727    Visit Number 1    Number of Visits 9    Date for OT Re-Evaluation 06/09/21    Authorization Type traditional medicaid    OT Start Time 1405    OT Stop Time 1450    OT Time Calculation (min) 45 min    Behavior During Therapy [facility] for tasks assessed/performed           Past Medical History:  Diagnosis Date  . Acid reflux   . Cerebral palsy [facility])     Past Surgical History:  Procedure Laterality Date  . dorsal rhizotomy     back ligament looosening surgery  . ESOPHAGOGASTRODUODENOSCOPY (EGD) WITH PROPOFOL N/A 10/08/2016   Procedure: ESOPHAGOGASTRODUODENOSCOPY (EGD) WITH PROPOFOL;  Surgeon: Willis Modena, MD;  Location: [facility];  Service: Endoscopy;  Laterality: N/A;  . left arm surgery      There were no vitals filed for this visit.   Subjective Assessment - 03/26/21 1426    Subjective  I am able to hold my toothbrush now with my left hand!  I have been gewtting botox    Currently in Pain? Yes    Pain Score 2     Pain Location Leg    Pain Orientation Left    Pain Descriptors / Indicators Aching    Pain Type Chronic pain    Pain Onset 1 to 4 weeks ago    Pain Frequency Intermittent    Aggravating Factors  circumferential - socks, dependent position             [facility] OT Assessment - 03/26/21 0001      Assessment   Medical Diagnosis CP S/P Botox    Referring Provider (OT) Marva Panda, NP    Onset Date/Surgical Date 02/14/21    Hand Dominance Right    Prior Therapy Known to OP OT      Prior Function   Level of Independence Needs assistance with ADLs    Vocation  Student;Self employed    Leisure Travel, socializing with friends/ students      ADL   Eating/Feeding Set up    Grooming Minimal assistance    Upper Body Bathing Minimal assistance    Lower Body Bathing Moderate assistance    Upper Body Dressing Moderate assistance    Lower Body Dressing +1 Total aassistance    Toilet Transfer + 1 Total assistance    Tub/Shower Transfer + 1 Total assistance    Tub/Shower Transfer Details (indicate cue type and reason lifted into shower by caregiver    Systems analyst with back    Equipment Used Wheelchair    ADL comments Has nurse to provide meals and care in home, Now has hydraulic lift for chair to bed      Written Expression   Dominant Hand Right      Cognition   Overall Cognitive Status Within Functional Limits for tasks assessed      Observation/Other Assessments   Focus on Therapeutic Outcomes (FOTO)  NA      Posture/Postural Control   Posture/Postural Control Postural limitations    Postural Limitations Posterior pelvic tilt;Flexed trunk;Weight shift right  Coordination   Gross Motor Movements are Fluid and Coordinated No    Fine Motor Movements are Fluid and Coordinated No      ROM / Strength   AROM / PROM / Strength PROM      PROM   Overall PROM  Deficits    Overall PROM Comments Significant limitations left UE - shoulder to 120 flex, elbow -15 extension, digits limited specifically - extension of 3rd digit      Left Hand AROM   L Index  MCP 0-90 65 Degrees    L Index PIP 0-100 80 Degrees    L Long  MCP 0-90 70 Degrees    L Long PIP 0-100 50 Degrees    L Ring  MCP 0-90 65 Degrees    L Ring PIP 0-100 80 Degrees    L Little  MCP 0-90 50 Degrees    L Little PIP 0-100 75 Degrees                           OT Education - 03/26/21 1726    Education Details Reviewed potential goals and plan of care for OT.  Reviewed potential plan if patient has hand surgery to sve OT visits with  hand therapist for recovery    Person(s) Educated Patient    Methods Explanation    Comprehension Verbalized understanding            OT Short Term Goals - 03/26/21 1732      OT SHORT TERM GOAL #1   Title Patient will complete a bedside commode transfer with mod assist use lift in her apartment    Baseline Unable to transfer to drop arm BSC in apartment    Time 4    Period Weeks    Status New    Target Date 05/10/21      OT SHORT TERM GOAL #2   Title Patient will demonstrate ability to don/doff left hand splint and demo understanding of wearing schedule    Baseline wears inconsistently, per patient splint may need refabrication or refitting    Time 4    Period Weeks    Status New      OT SHORT TERM GOAL #3   Title Patient will reach to feet in reclined upright position with adaptive equipment in prep for aide with LB dressing    Baseline Dependent LB dressing - unable to reach feet    Time 4    Period Weeks    Status New             OT Long Term Goals - 03/26/21 1736      OT LONG TERM GOAL #1   Title Patient will explore shower transfer equipment    Baseline total assist - being lifted by caregiver    Time 8    Period Weeks    Status New    Target Date 06/09/21      OT LONG TERM GOAL #2   Title Patient will pull thread loose fitting pants over feet and pull up to thighs with mod assist    Baseline dependent    Time 8    Period Weeks    Status New      OT LONG TERM GOAL #3   Title Patient will roll left/right to assist with donning of incontinence brief, or explore pull up type brief    Baseline dependent    Time 8    Period  Weeks    Status New      OT LONG TERM GOAL #4   Title Patient will don bra with min assist and increased time    Baseline dependent    Time 8    Period Weeks    Status New      OT LONG TERM GOAL #5   Title Patient will demonstrate slide board level surface transfer in preparation for travel, or BSC transitions    Baseline Mod  to total assist    Time 8    Period Weeks    Status New                 Plan - 03/26/21 1727    Clinical Impression Statement Patient is a 35 yr old woman well known to this clinician from previous episode of care.  Patient with CP and with recent botox injection for LUE functioning.  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  Patient eager to continue to work toward increased autonmoy with daily life skills.  Patient eager to explore adaptive techniques, and/or remediation of deficits where feasible to make her more independent with bathing, dressing and hygiene skills.    OT Occupational Profile and History Problem Focused Assessment - Including review of records relating to presenting problem    Occupational performance deficits (Please refer to evaluation for details): ADL's;IADL's    Body Structure / Function / Physical Skills ADL;Coordination;Endurance;GMC;UE functional use;Balance;Fascial restriction;Skin integrity;Pain;IADL;Flexibility;Decreased knowledge of use of DME;Body mechanics;Dexterity;FMC;Strength;Tone;ROM;Mobility;Edema    Rehab Potential Good    Clinical Decision Making Several treatment options, min-mod task modification necessary    Comorbidities Affecting Occupational Performance: May have comorbidities impacting occupational performance    Modification or Assistance to Complete Evaluation  Min-Moderate modification of tasks or assist with assess necessary to complete eval    OT Frequency 1x / week    OT Duration 8 weeks    OT Treatment/Interventions Self-care/ADL training;Electrical Stimulation;Therapeutic exercise;Aquatic Therapy;Moist Heat;Neuromuscular education;Splinting;Patient/family education;Balance training;Therapeutic activities;Functional Mobility Training;Fluidtherapy;Ultrasound;Contrast Bath;Cryotherapy;DME and/or AE instruction;Manual Therapy;Passive range of motion    Plan Problem solve LB dressing  equipment, positioning, including brief    Consulted and Agree with Plan of Care Patient           Patient will benefit from skilled therapeutic intervention in order to improve the following deficits and impairments:   Body Structure / Function / Physical Skills: ADL,Coordination,Endurance,GMC,UE functional use,Balance,Fascial restriction,Skin integrity,Pain,IADL,Flexibility,Decreased knowledge of use of DME,Body mechanics,Dexterity,FMC,Strength,Tone,ROM,Mobility,Edema       Visit Diagnosis: Abnormal posture - Plan: Ot plan of care cert/re-cert  Muscle weakness (generalized) - Plan: Ot plan of care cert/re-cert  Other symptoms and signs involving the nervous system - Plan: Ot plan of care cert/re-cert  Stiffness of left hand, not elsewhere classified - Plan: Ot plan of care cert/re-cert  Stiffness of left wrist, not elsewhere classified - Plan: Ot plan of care cert/re-cert    Problem List Patient Active Problem List   Diagnosis Date Noted  . Cerebral palsy (HCC) 04/25/2020  . 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 Other acquired deformity of other parts of limb 04/25/2020  . 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 Unspecified deformity of forearm, excluding fingers 04/25/2020  . Urinary incontinence 04/25/2020  . Iron deficiency anemia due to chronic blood loss 07/10/2017    Collier SalinaGellert, Duane Earnshaw M, OTR/L 03/26/2021, 5:42 PM  [facility] [address]  [address] [place], [place], [address] Phone: [phone]   Fax:  [phone]  Name: ROBYN GALATI MRN: [id number] Date of Birth: 1986-02-12

## 2021-03-29 HISTORY — PX: OTHER SURGICAL HISTORY: SHX169

## 2021-04-04 ENCOUNTER — Ambulatory Visit: Payer: Medicaid Other | Attending: *Deleted

## 2021-04-04 ENCOUNTER — Other Ambulatory Visit: Payer: Self-pay

## 2021-04-04 DIAGNOSIS — M6249 Contracture of muscle, multiple sites: Secondary | ICD-10-CM | POA: Diagnosis present

## 2021-04-04 DIAGNOSIS — R293 Abnormal posture: Secondary | ICD-10-CM | POA: Insufficient documentation

## 2021-04-04 DIAGNOSIS — M6281 Muscle weakness (generalized): Secondary | ICD-10-CM | POA: Insufficient documentation

## 2021-04-04 DIAGNOSIS — R29818 Other symptoms and signs involving the nervous system: Secondary | ICD-10-CM | POA: Insufficient documentation

## 2021-04-04 DIAGNOSIS — R2689 Other abnormalities of gait and mobility: Secondary | ICD-10-CM | POA: Insufficient documentation

## 2021-04-04 NOTE — Therapy (Signed)
[facility] [address] [place], [place], [address] Phone: [phone]   Fax:  [phone]  Physical Therapy Evaluation  Patient Details  Name: Christine Gomez MRN: [id number] Date of Birth: 07/30/1986 Referring Provider (PT): Marva Panda, NP   Encounter Date: 04/04/2021   PT End of Session - 04/04/21 1535    Visit Number 1    Number of Visits 9    Date for PT Re-Evaluation --   at 9th Visit   Authorization Type Traditional MCD (VL: 27)    Authorization Time Period Waiting Authorization    PT Start Time 1532    PT Stop Time 1615    PT Time Calculation (min) 43 min    Equipment Utilized During Treatment Gait belt    Activity Tolerance Patient tolerated treatment well    Behavior During Therapy WFL for tasks assessed/performed           Past Medical History:  Diagnosis Date  . Acid reflux   . Cerebral palsy [facility])     Past Surgical History:  Procedure Laterality Date  . dorsal rhizotomy     back ligament looosening surgery  . ESOPHAGOGASTRODUODENOSCOPY (EGD) WITH PROPOFOL N/A 10/08/2016   Procedure: ESOPHAGOGASTRODUODENOSCOPY (EGD) WITH PROPOFOL;  Surgeon: Willis Modena, MD;  [facility]: [facility];  Service: Endoscopy;  Laterality: N/A;  . left arm surgery      There were no vitals filed for this visit.    Subjective Assessment - 04/04/21 1536    Subjective Patient reports that she has not been stretching due to her friend moving. Patient reports that she has had more stiffness/pain in the hips due to not moving. Patient reports she has increased swelling in BLE, that she noted. Patient reports she is having surgery on 4/19 in the L Hand. Patient has not completed slide board transfer since January. Patient reports that she got a lift for her bed (freedom bridge), but wants to be able to do the sliding board for travel if needed. Is currently getting botox in BLE (mainly in thigh region).    Pertinent  History CP, Acid Reflux    Limitations Sitting;Standing;Walking;House hold activities    Patient Stated Goals Improve ROM/mobility in Legs; Standing longer potential for taking steps; improve sliding board transfer.    Currently in Pain? No/denies              [facility] PT Assessment - 04/04/21 0001      Assessment   Medical Diagnosis Cerebral Palsy    Referring Provider (PT) Marva Panda, NP    Onset Date/Surgical Date 02/14/21    Hand Dominance Right    Prior Therapy Known to Clinic for prior PT services      Precautions   Precautions Fall;Other (comment)    Precaution Comments B Hips Dislocated      Restrictions   Weight Bearing Restrictions No      Balance Screen   Has the patient fallen in the past 6 months No    Has the patient had a decrease in activity level because of a fear of falling?  No    Is the patient reluctant to leave their home because of a fear of falling?  No      Home Environment   Living Environment Private residence    Living Arrangements Non-relatives/Friends   2 roommates   Available Help at Discharge Other (Comment)   Aide; 3 hours Morning, 4 hours Evening daily   Type of Home  Apartment    Home Layout One level    Home Equipment Hand held shower head;Wheelchair - power;Tub bench;Other (comment)   Freedom Bridge   Additional Comments her powerchair has active reach feature.      Prior Function   Level of Independence Needs assistance with ADLs    Vocation Student;Self employed    Psychologist, occupational of Teaching laboratory technician    Leisure Travel, socializing with friends/ students      Cognition   Overall Cognitive Status Within Functional Limits for tasks assessed      Observation/Other Assessments   Skin Integrity Intact. No Issues Noted.      Sensation   Light Touch Appears Intact      Posture/Postural Control   Posture/Postural Control Postural limitations    Postural Limitations Posterior pelvic tilt;Flexed trunk;Weight shift  right;Rounded Shoulders      ROM / Strength   AROM / PROM / Strength AROM;PROM      AROM   Overall AROM  Deficits    Overall AROM Comments deficits due to BLE spasticity/contractures and muscle weakness      PROM   Overall PROM  Deficits    Overall PROM Comments general PROM limitations in BLE: including knee flexion/extension, and DF/PF. No formal measurements assesed today, will plan to assess at next visit.      Transfers   Transfers Lateral/Scoot Transfers    Lateral/Scoot Transfers With slide board;2: Max assist    Lateral/Scoot Transfer Details (indicate cue type and reason) completed lateral slide board transfer from power chair <> mat with Max A for completion. PT placed sliding board for patient today,nhowever patient able to complete lateral weight shift to offload hip to assist with placement. Pt able to utilize RUE to help with transfer however limited with LUE. PT providing verbal cues for proper anterior weight shift. Patient often bear hugging PT during transfer. intermittent cues for hand placement.            Objective measurements completed on examination: See above findings.       PT Education - 04/04/21 1536    Education Details Educated on [employer]) Educated Patient    Methods Explanation    Comprehension Verbalized understanding            PT Short Term Goals - 04/04/21 1859      PT SHORT TERM GOAL #1   Title Pt will be able to perform initial HEP for flexibility/ROM, strengthening with assist of aides for improved mobility. (ALL STGs Due: 5th Visit)    Baseline not currently completing HEP established during prior POC consistently    Time 4    Period --   visits   Status New      PT SHORT TERM GOAL #2   Title Patient will be able to stand in [place] for >/= 1 minute to demo improved activity tolerance    Baseline TBA    Time 4    Period --   visits   Status New      PT SHORT TERM GOAL #3   Title Formal PROM  measurements of BLE assessed and LTG to be set as appropriate    Baseline TBA    Time 4    Period --   visits   Status New             PT Long Term Goals - 04/04/21 1900      PT LONG TERM GOAL #1  Title Pt will be independent with progress strengthening, flexibility and mobility program for HEP to continue on own. (ALL LTG Due: 9th Visit)    Baseline Not completing HEP consistently    Time 8    Period --   visits   Status New      PT LONG TERM GOAL #2   Title Pt will be able to perform slideboard transfer with mod A for improved ability to participate in transfers and independence.    Baseline Max A    Time 8    Period --   visits   Status New      PT LONG TERM GOAL #3   Title Patient will demo ability to stand in Standing Frame for >/= 2 minutes for improved functional strength/ROM    Baseline TBA    Time 8    Period --   visits   Status New      PT LONG TERM GOAL #4   Title LTG to be set for BLE ROM measurements upon assesment.    Baseline TBA    Time 8    Period --   visits   Status New                  Plan - 04/04/21 1852    Clinical Impression Statement Patient is a 35 y.o. female referred to Neuro OPPT services for Cerebral Palsy. Patient's PMH significant for the following: CP, Acid Reflux. Patient presents with the following impairments: abnormal posture, abnormal tone and increased spasticity, impaired AROM/PROM, decreased functional strength, decreased mobility, and decreased activity tolerance. Patient continues to use power chair for primary means of mobility, and is not ambulatory. Paitent continues to be Max A for sliding board transfer at this time. Patient will benefit from skilled PT services to imrpove independence, improve ROM, and maximize functional mobility.    Personal Factors and Comorbidities Comorbidity 2;Time since onset of injury/illness/exacerbation;Transportation    Comorbidities CP, Acid Reflux    Examination-Activity Limitations  Bathing;Bed Mobility;Toileting;Transfers;Reach Overhead;Stand;Sit    Examination-Participation Restrictions Interpersonal Relationship    Stability/Clinical Decision Making Stable/Uncomplicated    Clinical Decision Making Low    Rehab Potential Fair    PT Frequency 1x / week    PT Duration 8 weeks    PT Treatment/Interventions ADLs/Self Care Home Management;Cryotherapy;Electrical Stimulation;Moist Heat;DME Instruction;Gait training;Functional mobility training;Therapeutic activities;Therapeutic exercise;Balance training;Neuromuscular re-education;Patient/family education;Wheelchair mobility training;Manual techniques;Vestibular;Joint Manipulations;Spinal Manipulations;Passive range of motion    PT Next Visit Plan Complete sliding board transfer; formal assesment of ROM in BLE. Try Standing Frame. Review and update HEP    Consulted and Agree with Plan of Care Patient           Patient will benefit from skilled therapeutic intervention in order to improve the following deficits and impairments:  Pain,Postural dysfunction,Impaired tone,Decreased mobility,Decreased activity tolerance,Decreased endurance,Decreased range of motion,Decreased strength,Hypomobility,Impaired UE functional use,Impaired flexibility,Difficulty walking  Visit Diagnosis: Abnormal posture  Contracture of muscle, multiple sites  Other abnormalities of gait and mobility  Muscle weakness (generalized)     Problem List Patient Active Problem List   Diagnosis Date Noted  . Cerebral palsy (HCC) 04/25/2020  . 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 Other acquired deformity of other parts of limb 04/25/2020  . 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 Unspecified deformity of forearm, excluding fingers 04/25/2020  . Urinary incontinence 04/25/2020  . Iron deficiency anemia due to chronic blood loss 07/10/2017  Tempie Donning, PT,  DPT 04/04/2021, 7:05 PM  [facility] [address] [place], [place], [address] Phone: [phone]   Fax:  [phone]  Name: Christine Gomez MRN: [id number] Date of Birth: Nov 22, 1986

## 2021-04-10 ENCOUNTER — Other Ambulatory Visit: Payer: Self-pay

## 2021-04-10 ENCOUNTER — Ambulatory Visit: Payer: Medicaid Other | Admitting: Occupational Therapy

## 2021-04-10 DIAGNOSIS — R293 Abnormal posture: Secondary | ICD-10-CM

## 2021-04-10 DIAGNOSIS — R29818 Other symptoms and signs involving the nervous system: Secondary | ICD-10-CM

## 2021-04-10 DIAGNOSIS — M6281 Muscle weakness (generalized): Secondary | ICD-10-CM

## 2021-04-10 NOTE — Therapy (Signed)
[facility] [address] [place], [place], [address] Phone: [phone]   Fax:  [phone]  Occupational Therapy Discharge Summary  Patient Details  Name: Christine Gomez MRN: [id number] Date of Birth: [birth year] Referring Provider (OT): Marva Panda, NP   Encounter Date: 04/10/2021  Patient approved for three initial OT visits, however noted patient now scheduled to have tendon transfer surgery left hand next week.  Anticipate patient will need follow up post surgical hand therapy - and will be restricted in movement of LUE - so opted at this time to d/c OP OT addressing functional transfers and ADL's to allow hand therapy to take priority.   Discussed at length with patient who is in agreement.   Past Medical History:  Diagnosis Date  . Acid reflux   . Cerebral palsy [facility])     Past Surgical History:  Procedure Laterality Date  . dorsal rhizotomy     back ligament looosening surgery  . ESOPHAGOGASTRODUODENOSCOPY (EGD) WITH PROPOFOL N/A 10/08/2016   Procedure: ESOPHAGOGASTRODUODENOSCOPY (EGD) WITH PROPOFOL;  Surgeon: Willis Modena, MD;  Location: [facility];  Service: Endoscopy;  Laterality: N/A;  . left arm surgery      There were no vitals filed for this visit.                           OT Short Term Goals - 03/26/21 1732      OT SHORT TERM GOAL #1   Title Patient will complete a bedside commode transfer with mod assist use lift in her apartment    Baseline Unable to transfer to drop arm BSC in apartment    Time 4    Period Weeks    Status New    Target Date 05/10/21      OT SHORT TERM GOAL #2   Title Patient will demonstrate ability to don/doff left hand splint and demo understanding of wearing schedule    Baseline wears inconsistently, per patient splint may need refabrication or refitting    Time 4    Period Weeks    Status New      OT SHORT TERM GOAL #3   Title  Patient will reach to feet in reclined upright position with adaptive equipment in prep for aide with LB dressing    Baseline Dependent LB dressing - unable to reach feet    Time 4    Period Weeks    Status New             OT Long Term Goals - 03/26/21 1736      OT LONG TERM GOAL #1   Title Patient will explore shower transfer equipment    Baseline total assist - being lifted by caregiver    Time 8    Period Weeks    Status New    Target Date 06/09/21      OT LONG TERM GOAL #2   Title Patient will pull thread loose fitting pants over feet and pull up to thighs with mod assist    Baseline dependent    Time 8    Period Weeks    Status New      OT LONG TERM GOAL #3   Title Patient will roll left/right to assist with donning of incontinence brief, or explore pull up type brief    Baseline dependent    Time 8    Period Weeks    Status New  OT LONG TERM GOAL #4   Title Patient will don bra with min assist and increased time    Baseline dependent    Time 8    Period Weeks    Status New      OT LONG TERM GOAL #5   Title Patient will demonstrate slide board level surface transfer in preparation for travel, or BSC transitions    Baseline Mod to total assist    Time 8    Period Weeks    Status New                  Patient will benefit from skilled therapeutic intervention in order to improve the following deficits and impairments:           Visit Diagnosis: Abnormal posture  Muscle weakness (generalized)  Other symptoms and signs involving the nervous system    Problem List Patient Active Problem List   Diagnosis Date Noted  . Cerebral palsy (HCC) 04/25/2020  . 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 Other acquired deformity of other parts of limb 04/25/2020  . 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 Unspecified deformity of forearm, excluding fingers 04/25/2020  .  Urinary incontinence 04/25/2020  . Iron deficiency anemia due to chronic blood loss 07/10/2017    Collier Salina, OTR/L 04/10/2021, 3:11 PM  [facility] [address] [place], [place], [address] Phone: [phone]   Fax:  [phone]  Name: MAIKO SALAIS MRN: [id number] Date of Birth: Jul 16, 1986

## 2021-04-17 ENCOUNTER — Ambulatory Visit: Payer: Medicaid Other | Admitting: Occupational Therapy

## 2021-04-24 ENCOUNTER — Ambulatory Visit: Payer: Medicaid Other

## 2021-04-24 ENCOUNTER — Encounter: Payer: Medicaid Other | Admitting: Occupational Therapy

## 2021-05-01 ENCOUNTER — Ambulatory Visit: Payer: Medicaid Other

## 2021-05-01 ENCOUNTER — Encounter: Payer: Medicaid Other | Admitting: Occupational Therapy

## 2021-05-01 DIAGNOSIS — M66242 Spontaneous rupture of extensor tendons, left hand: Secondary | ICD-10-CM | POA: Insufficient documentation

## 2021-05-03 ENCOUNTER — Ambulatory Visit: Payer: Medicaid Other | Attending: *Deleted

## 2021-05-08 ENCOUNTER — Encounter: Payer: Medicaid Other | Admitting: Occupational Therapy

## 2021-05-08 ENCOUNTER — Ambulatory Visit: Payer: Medicaid Other

## 2021-05-15 ENCOUNTER — Ambulatory Visit: Payer: Medicaid Other

## 2021-05-15 ENCOUNTER — Encounter: Payer: Medicaid Other | Admitting: Occupational Therapy

## 2021-05-22 ENCOUNTER — Ambulatory Visit: Payer: Medicaid Other

## 2021-05-22 ENCOUNTER — Encounter: Payer: Medicaid Other | Admitting: Occupational Therapy

## 2021-05-29 ENCOUNTER — Encounter: Payer: Medicaid Other | Admitting: Occupational Therapy

## 2021-05-29 ENCOUNTER — Ambulatory Visit: Payer: Medicaid Other

## 2021-05-31 ENCOUNTER — Ambulatory Visit (INDEPENDENT_AMBULATORY_CARE_PROVIDER_SITE_OTHER): Payer: Medicaid Other | Admitting: Podiatry

## 2021-05-31 ENCOUNTER — Other Ambulatory Visit: Payer: Self-pay

## 2021-05-31 ENCOUNTER — Encounter: Payer: Self-pay | Admitting: Podiatry

## 2021-05-31 DIAGNOSIS — M79676 Pain in unspecified toe(s): Secondary | ICD-10-CM

## 2021-05-31 DIAGNOSIS — B351 Tinea unguium: Secondary | ICD-10-CM

## 2021-05-31 DIAGNOSIS — R1013 Epigastric pain: Secondary | ICD-10-CM | POA: Insufficient documentation

## 2021-05-31 DIAGNOSIS — M79609 Pain in unspecified limb: Secondary | ICD-10-CM

## 2021-05-31 DIAGNOSIS — R1310 Dysphagia, unspecified: Secondary | ICD-10-CM | POA: Insufficient documentation

## 2021-05-31 DIAGNOSIS — K449 Diaphragmatic hernia without obstruction or gangrene: Secondary | ICD-10-CM | POA: Insufficient documentation

## 2021-05-31 DIAGNOSIS — K920 Hematemesis: Secondary | ICD-10-CM | POA: Insufficient documentation

## 2021-05-31 DIAGNOSIS — K219 Gastro-esophageal reflux disease without esophagitis: Secondary | ICD-10-CM | POA: Insufficient documentation

## 2021-05-31 DIAGNOSIS — D509 Iron deficiency anemia, unspecified: Secondary | ICD-10-CM | POA: Insufficient documentation

## 2021-06-05 ENCOUNTER — Ambulatory Visit: Payer: Medicaid Other | Attending: *Deleted

## 2021-06-05 ENCOUNTER — Other Ambulatory Visit: Payer: Self-pay

## 2021-06-05 DIAGNOSIS — R2689 Other abnormalities of gait and mobility: Secondary | ICD-10-CM | POA: Diagnosis present

## 2021-06-05 DIAGNOSIS — R293 Abnormal posture: Secondary | ICD-10-CM | POA: Insufficient documentation

## 2021-06-05 DIAGNOSIS — M6281 Muscle weakness (generalized): Secondary | ICD-10-CM | POA: Insufficient documentation

## 2021-06-05 NOTE — Therapy (Signed)
[facility] [address] [place], [place], [address] Phone: [phone]   Fax:  [phone]  Physical Therapy Treatment/Re-Certification  Patient Details  Name: Christine Gomez MRN: [id number] Date of Birth: [birth year] Referring Provider (PT): Marva Panda, NP   Encounter Date: 06/05/2021   PT End of Session - 06/05/21 1400    Visit Number 2    Number of Visits 10    Date for PT Re-Evaluation --   at 10th Visit   Authorization Type Traditional MCD ([id number])    Authorization Time Period Waiting Authorization    PT Start Time 1402    PT Stop Time 1446    PT Time Calculation (min) 44 min    Equipment Utilized During Treatment Gait belt    Activity Tolerance Patient tolerated treatment well    Behavior During Therapy WFL for tasks assessed/performed           Past Medical History:  Diagnosis Date  . Acid reflux   . Cerebral palsy [facility])     Past Surgical History:  Procedure Laterality Date  . dorsal rhizotomy     back ligament looosening surgery  . ESOPHAGOGASTRODUODENOSCOPY (EGD) WITH PROPOFOL N/A 10/08/2016   Procedure: ESOPHAGOGASTRODUODENOSCOPY (EGD) WITH PROPOFOL;  Surgeon: Willis Modena, MD;  Location: [facility];  Service: Endoscopy;  Laterality: N/A;  . left arm surgery      There were no vitals filed for this visit.   Subjective Assessment - 06/05/21 1411    Subjective Patient has extended absence from PT services due to hand surgery. PT Resume orders have been recieved. Currently having menstrual pain. Patient reports is now toileting on her own with use of freedom bridge (lift).    Pertinent History CP, Acid Reflux    Limitations Sitting;Standing;Walking;House hold activities    Patient Stated Goals Improve ROM/mobility in Legs; Standing longer potential for taking steps; improve sliding board transfer.    Currently in Pain? No/denies   menstrual pain             [facility] PT Assessment -  06/05/21 0001      Assessment   Medical Diagnosis Cerebral Palsy    Referring Provider (PT) Marva Panda, NP      ROM / Strength   AROM / PROM / Strength PROM      AROM   Overall AROM  Deficits    Overall AROM Comments deficits due to BLE spasticity/contractures and muscle weakness      PROM   Overall PROM  Deficits    PROM Assessment Site Knee    Right/Left Knee Right;Left    Right Knee Extension -24    Left Knee Extension -35      Flexibility   Soft Tissue Assessment /Muscle Length yes    Hamstrings significant tightness noted bilaterally            [facility] Adult PT Treatment/Exercise - 06/05/21 0001      Bed Mobility   Bed Mobility Sit to Supine;Sitting - Scoot to Edge of Bed;Supine to Sit    Supine to Sit Moderate Assistance - Patient 50-74%   assistance required with BLE and trunk   Sitting - Scoot to Edge of Bed Minimal Assistance - Patient > 75%    Sit to Supine Moderate Assistance - Patient 50-74%   able to Mod I removed LE from bed, but require Mod A for trunk     Transfers   Transfers Lateral/Scoot Transfers    Lateral/Scoot Transfers  With slide board;2: Max assist    Lateral/Scoot Transfer Details (indicate cue type and reason) completed lateral slide board transfer from power chair <> mat with Max A for completion. PT continues to place sliding board,  patient able to complete lateral weight shift to offload hip to assist with placement. Pt able to utilize RUE to help with transfer, extreme caution used to avoid injury to LUE today. PT providing verbal cues for proper anterior weight shift to promote improved off loading. intermittent cues for hand placement.      Exercises   Exercises Other Exercises    Other Exercises  With patient supine, PT completed manual B gastroc stretch 3 x 30 seconds progressing to patient's tolerance, intermittent rest breaks due to spasm. Completed B Hamstring stretch 3 x 30 seconds, prgoressing into range. Increased spasms.  Patient tolerating stretching well. PT educating patient on potential options for completing long duration hamstring stretch at home to promote improvements in muscle tension/tightness outside of PT sessions.               PT Education - 06/05/21 1631    Education Details Updated POC; Hamstring Stretch    Person(s) Educated Patient    Methods Explanation;Demonstration    Comprehension Verbalized understanding            PT Short Term Goals - 06/05/21 1641      PT SHORT TERM GOAL #1   Title Pt will be able to perform initial HEP for flexibility/ROM, strengthening with assist of aides for improved mobility. (ALL STGs Due: 6 Visit)    Baseline not currently completing HEP established during prior POC consistently    Time 4    Period --   visits   Status On-going      PT SHORT TERM GOAL #2   Title Patient will be able to stand in [place] for >/= 1 minute to demo improved activity tolerance    Baseline TBA    Time 4    Period --   visits   Status On-going      PT SHORT TERM GOAL #3   Title Formal PROM measurements of BLE assessed and LTG to be set as appropriate    Baseline Assessed on 06/05/21    Time 4    Period --   visits   Status Achieved          Updated Short Term Goals:   PT Short Term Goals - 06/05/21 1653      PT SHORT TERM GOAL #1   Title Pt will be able to perform initial HEP for flexibility/ROM, strengthening with assist of aides for improved mobility. (ALL STGs Due: 6 Visit)    Baseline not currently completing HEP established during prior POC consistently    Time 4    Period --   visits   Status On-going      PT SHORT TERM GOAL #2   Title Patient will be able to stand in [place] for >/= 1 minute to demo improved activity tolerance    Baseline TBA    Time 4    Period --   visits   Status On-going             PT Long Term Goals - 06/05/21 1651      PT LONG TERM GOAL #1   Title Pt will be independent with progress strengthening, flexibility  and mobility program for HEP to continue on own. (ALL LTG Due: 10th Visit)  Baseline Not completing HEP consistently    Time 8    Period --   visits   Status New      PT LONG TERM GOAL #2   Title Pt will be able to perform slideboard transfer with mod A for improved ability to participate in transfers and independence.    Baseline Max A    Time 8    Period --   visits   Status New      PT LONG TERM GOAL #3   Title Patient will demo ability to stand in Standing Frame for >/= 2 minutes for improved functional strength/ROM    Baseline TBA    Time 8    Period --   visits   Status New      PT LONG TERM GOAL #4   Title Patient will improve Knee Extension PROM to </= -20 degress bilaterally    Baseline -35 on LLE, -24 on RLE    Time 8    Period --   visits   Status New                 Plan - 06/05/21 1647    Clinical Impression Statement Patient returns to PT services after extended absence due to surgery on L Hand. Resume PT orders recieved. Today's session focused on continued transfer training with sliding board, patient continue to be Max A and Mod A for bed mobility. Completed asssesment of PROM knee extension, with significant limitations on BLE (LLE >RLE). Patient will continue to benefit from skilled PT services to address impairemtns and maximize functional mobility.    Personal Factors and Comorbidities Comorbidity 2;Time since onset of injury/illness/exacerbation;Transportation    Comorbidities CP, Acid Reflux    Examination-Activity Limitations Bathing;Bed Mobility;Toileting;Transfers;Reach Overhead;Stand;Sit    Examination-Participation Restrictions Interpersonal Relationship    Stability/Clinical Decision Making Stable/Uncomplicated    Rehab Potential Fair    PT Frequency 1x / week    PT Duration 8 weeks    PT Treatment/Interventions ADLs/Self Care Home Management;Cryotherapy;Electrical Stimulation;Moist Heat;DME Instruction;Gait training;Functional mobility  training;Therapeutic activities;Therapeutic exercise;Balance training;Neuromuscular re-education;Patient/family education;Wheelchair mobility training;Manual techniques;Vestibular;Joint Manipulations;Spinal Manipulations;Passive range of motion    PT Next Visit Plan Try Standing Frame.    Consulted and Agree with Plan of Care Patient           Patient will benefit from skilled therapeutic intervention in order to improve the following deficits and impairments:  Pain,Postural dysfunction,Impaired tone,Decreased mobility,Decreased activity tolerance,Decreased endurance,Decreased range of motion,Decreased strength,Hypomobility,Impaired UE functional use,Impaired flexibility,Difficulty walking  Visit Diagnosis: Abnormal posture  Muscle weakness (generalized)  Other abnormalities of gait and mobility     Problem List Patient Active Problem List   Diagnosis Date Noted  . Dysphagia 05/31/2021  . Epigastric pain 05/31/2021  . Gastroesophageal reflux disease 05/31/2021  . Hematemesis 05/31/2021  . Hiatal hernia 05/31/2021  . Microcytic anemia 05/31/2021  . Sagittal band rupture, extensor tendon, nontraumatic, left 05/01/2021  . Cerebral palsy (HCC) 04/25/2020  . 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 Other acquired deformity of other parts of limb 04/25/2020  . 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 Unspecified deformity of forearm, excluding fingers 04/25/2020  . Urinary incontinence 04/25/2020  . Iron deficiency anemia due to chronic blood loss 07/10/2017    Tempie Donning, PT, DPT 06/05/2021, 4:52 PM  [facility] [address] [place], [place], [address] Phone: [phone]   Fax:  [phone]  Name: Christine Gomez MRN: [id number] Date of Birth: 31-Oct-1986

## 2021-06-07 NOTE — Progress Notes (Signed)
  Subjective:  Patient ID: Christine Gomez, female    DOB: November 18, 1986,  MRN: [id number]  Kateena D Gallus is a pleasant African American female with h/o CP who presents to clinic today for thick, elongated toenails b/l feet which are tender when wearing enclosed shoe gear.  She states it seems her toenails have become thick and discolored again and is wondering if she should start the compounded topical antifungal toenail solution again.   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 She notes no new pedal problems on today's visit.   Allergies  Allergen Reactions   Nickel Itching and Swelling    Review of Systems: Negative except as noted in the HPI. Objective:   Constitutional Christine Gomez is a pleasant 35 y.o. African American female, obese in NAD. AAO x 3.   Vascular Capillary refill time to digits immediate b/l. Palpable pedal pulses b/l LE. Pedal hair present. Lower extremity skin temperature gradient within normal limits. Trace dependent edema noted b/l LE due to extended time in motororized chair. No pain with calf compression b/l. No cyanosis or clubbing noted.  Neurologic Normal speech. Oriented to person, place, and time. Protective sensation intact 5/5 intact bilaterally with 10g monofilament b/l.  Dermatologic Pedal skin with normal turgor, texture and tone bilaterally. No open wounds bilaterally. No interdigital macerations bilaterally. Toenails 1-5 b/l elongated, discolored, dystrophic, thickened, crumbly with subungual debris and tenderness to dorsal palpation.  Orthopedic: Noted disuse atrophy b/l LE. No pain crepitus or joint limitation noted with ROM b/l. Utilizes motorized chair for mobility assistance.   Radiographs: None Assessment:   1. Pain due to onychomycosis of nail    Plan:  Patient was evaluated and treated and all questions answered.  Onychomycosis with pain -Nails palliatively debridement as below -Educated on  self-care  Procedure: Nail Debridement Rationale: Pain Type of Debridement: manual, sharp debridement. Instrumentation: Nail nipper, rotary burr. Number of Nails: 10 -Examined patient. -Patient to continue soft, supportive shoe gear daily. -Patient instructed to resume topical antifungal solution to toenails once daily from [place]. New Rx will be faxed to pharmacy today. -Toenails 1-5 b/l were debrided in length and girth with sterile nail nippers and dremel without iatrogenic bleeding.  -Patient to report any pedal injuries to medical professional immediately. -Patient/POA to call should there be question/concern in the interim.  No follow-ups on file.  Freddie Breech, DPM

## 2021-06-12 ENCOUNTER — Ambulatory Visit: Payer: Medicaid Other

## 2021-06-20 ENCOUNTER — Ambulatory Visit: Payer: Medicaid Other

## 2021-06-27 ENCOUNTER — Ambulatory Visit: Payer: Medicaid Other

## 2021-06-27 ENCOUNTER — Other Ambulatory Visit: Payer: Self-pay

## 2021-06-27 DIAGNOSIS — M6281 Muscle weakness (generalized): Secondary | ICD-10-CM

## 2021-06-27 DIAGNOSIS — R2689 Other abnormalities of gait and mobility: Secondary | ICD-10-CM

## 2021-06-27 DIAGNOSIS — R293 Abnormal posture: Secondary | ICD-10-CM | POA: Diagnosis not present

## 2021-06-27 NOTE — Therapy (Signed)
[facility] [address] [place], [place], [address] Phone: [phone]   Fax:  [phone]  Physical Therapy Treatment  Patient Details  Name: Christine Gomez MRN: [id number] Date of Birth: 18-Apr-1986 Referring Provider (PT): Marva Panda, NP   Encounter Date: 06/27/2021   PT End of Session - 06/27/21 1359     Visit Number 3    Number of Visits 10    Date for PT Re-Evaluation --   at 10th visit   Authorization Type Traditional MCD (VL: 27)    Authorization Time Period Authorization for Initial 3 Visits (6/30-7/20)    Authorization - Visit Number 1    Authorization - Number of Visits 3    PT Start Time 1400    PT Stop Time 1444    PT Time Calculation (min) 44 min    Equipment Utilized During Treatment Gait belt    Activity Tolerance Patient tolerated treatment well    Behavior During Therapy WFL for tasks assessed/performed             Past Medical History:  Diagnosis Date   Acid reflux    Cerebral palsy (HCC)     Past Surgical History:  Procedure Laterality Date   dorsal rhizotomy     back ligament looosening surgery   ESOPHAGOGASTRODUODENOSCOPY (EGD) WITH PROPOFOL N/A 10/08/2016   Procedure: ESOPHAGOGASTRODUODENOSCOPY (EGD) WITH PROPOFOL;  Surgeon: Willis Modena, MD;  Location: [facility];  Service: Endoscopy;  Laterality: N/A;   left arm surgery      There were no vitals filed for this visit.   Subjective Assessment - 06/27/21 1403     Subjective Patient reports that she was able to get her nails done, so excited. Recieved Botox in legs on 6/24. Patient reports that she is leaving for trip on July 23rd.    Pertinent History CP, Acid Reflux    Limitations Sitting;Standing;Walking;House hold activities    Patient Stated Goals Improve ROM/mobility in Legs; Standing longer potential for taking steps; improve sliding board transfer.    Currently in Pain? Yes    Pain Score 2     Pain Location  Back    Pain Orientation Lower    Pain Descriptors / Indicators Aching;Discomfort    Pain Type Chronic pain                 [facility] Adult PT Treatment/Exercise - 06/27/21 0001       Transfers   Transfers Sit to Stand;Stand to Sit    Sit to Stand --   with standing frame, using RUE on bar   Stand to Sit --   with standing frame, using RUE on bar     Ambulation/Gait   Pre-Gait Activities Trialed Standing Frame during session. PT and tech using sling under patient's B hips and raised patient into standing, adjusted for patients height. Completed standing in frame with BLE x 4 minutes with cues for upright posture, weight shift and core activation. PT providing assistance with positioning of BLE to promote improved alignment. Patient require seated rest break after completion. Completed second trial of standing in standing frame, coming up into standing and then working on reaching task to cones, completed x 3 reps to R side using RUE, then progressed to completing x 3 reps on L side using LUE. Increased challenge still ntoed iwth LUE but improved grasp noted due to prior surgery and OT. Patient continue to demo mild knee flexion in standing due to contracture/muscle tightness.  Paitent able to stand 3 mins, 30 seconds second rep.      Self-Care   Self-Care Other Self-Care Comments    Other Self-Care Comments  PT educating on continued stretching in chair without assistance, including raises foot rest and allow for legs to be extended for prolonged stretch to hamstrings. Pt verbalized understanding.                PT Education - 06/27/21 1628     Education Details Continously Stretch in Chair for improved positioning    Person(s) Educated Patient    Methods Explanation    Comprehension Verbalized understanding              PT Short Term Goals - 06/05/21 1653       PT SHORT TERM GOAL #1   Title Pt will be able to perform initial HEP for flexibility/ROM, strengthening with  assist of aides for improved mobility. (ALL STGs Due: 6 Visit)    Baseline not currently completing HEP established during prior POC consistently    Time 4    Period --   visits   Status On-going      PT SHORT TERM GOAL #2   Title Patient will be able to stand in [place] for >/= 1 minute to demo improved activity tolerance    Baseline TBA    Time 4    Period --   visits   Status On-going               PT Long Term Goals - 06/05/21 1651       PT LONG TERM GOAL #1   Title Pt will be independent with progress strengthening, flexibility and mobility program for HEP to continue on own. (ALL LTG Due: 10th Visit)    Baseline Not completing HEP consistently    Time 8    Period --   visits   Status New      PT LONG TERM GOAL #2   Title Pt will be able to perform slideboard transfer with mod A for improved ability to participate in transfers and independence.    Baseline Max A    Time 8    Period --   visits   Status New      PT LONG TERM GOAL #3   Title Patient will demo ability to stand in Standing Frame for >/= 2 minutes for improved functional strength/ROM    Baseline TBA    Time 8    Period --   visits   Status New      PT LONG TERM GOAL #4   Title Patient will improve Knee Extension PROM to </= -20 degress bilaterally    Baseline -35 on LLE, -24 on RLE    Time 8    Period --   visits   Status New               Plan - 06/27/21 1400     Clinical Impression Statement Trialed Standing Frame today to promote improved weight bearing bilaterally and improved standing tolerance. Patient tolerated well, increased challenge still noted due to contractures bilaterally, but demo improved ability to stand for longer duration in comparison to // bars. Will continue to progress toward all LTGs.    Personal Factors and Comorbidities Comorbidity 2;Time since onset of injury/illness/exacerbation;Transportation    Comorbidities CP, Acid Reflux    Examination-Activity  Limitations Bathing;Bed Mobility;Toileting;Transfers;Reach Overhead;Stand;Sit    Examination-Participation Restrictions Interpersonal Relationship    Stability/Clinical Decision  Making Stable/Uncomplicated    Rehab Potential Fair    PT Frequency 1x / week    PT Duration 8 weeks    PT Treatment/Interventions ADLs/Self Care Home Management;Cryotherapy;Electrical Stimulation;Moist Heat;DME Instruction;Gait training;Functional mobility training;Therapeutic activities;Therapeutic exercise;Balance training;Neuromuscular re-education;Patient/family education;Wheelchair mobility training;Manual techniques;Vestibular;Joint Manipulations;Spinal Manipulations;Passive range of motion    PT Next Visit Plan Continue use of Standing Frame or Try Sara Plus. Continue supine stretching, transfer training.    Consulted and Agree with Plan of Care Patient             Patient will benefit from skilled therapeutic intervention in order to improve the following deficits and impairments:  Pain, Postural dysfunction, Impaired tone, Decreased mobility, Decreased activity tolerance, Decreased endurance, Decreased range of motion, Decreased strength, Hypomobility, Impaired UE functional use, Impaired flexibility, Difficulty walking  Visit Diagnosis: Abnormal posture  Muscle weakness (generalized)  Other abnormalities of gait and mobility     Problem List Patient Active Problem List   Diagnosis Date Noted   Dysphagia 05/31/2021   Epigastric pain 05/31/2021   Gastroesophageal reflux disease 05/31/2021   Hematemesis 05/31/2021   Hiatal hernia 05/31/2021   Microcytic anemia 05/31/2021   Sagittal band rupture, extensor tendon, nontraumatic, left 05/01/2021   Cerebral palsy (HCC) 04/25/2020   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   Other acquired deformity of other parts of limb 04/25/2020   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

## 2021-07-04 ENCOUNTER — Other Ambulatory Visit: Payer: Self-pay

## 2021-07-04 ENCOUNTER — Ambulatory Visit: Payer: Medicaid Other | Attending: *Deleted

## 2021-07-04 DIAGNOSIS — M6249 Contracture of muscle, multiple sites: Secondary | ICD-10-CM | POA: Insufficient documentation

## 2021-07-04 DIAGNOSIS — R2689 Other abnormalities of gait and mobility: Secondary | ICD-10-CM | POA: Insufficient documentation

## 2021-07-04 DIAGNOSIS — M6281 Muscle weakness (generalized): Secondary | ICD-10-CM | POA: Insufficient documentation

## 2021-07-04 DIAGNOSIS — R293 Abnormal posture: Secondary | ICD-10-CM | POA: Diagnosis present

## 2021-07-04 NOTE — Therapy (Signed)
[facility] [address] [place], [place], [address] Phone: [phone]   Fax:  [phone]  Physical Therapy Treatment  Patient Details  Name: Christine Gomez MRN: [id number] Date of Birth: 12/15/1986 Referring Provider (PT): Marva Panda, NP   Encounter Date: 07/04/2021   PT End of Session - 07/04/21 1452     Visit Number 4    Number of Visits 10    Date for PT Re-Evaluation --   at 10th visit   Authorization Type Traditional MCD (VL: 27)    Authorization Time Period Authorization for Initial 3 Visits (6/30-7/20)    Authorization - Visit Number 2    Authorization - Number of Visits 3    PT Start Time 1448    PT Stop Time 1530    PT Time Calculation (min) 42 min    Equipment Utilized During Treatment Gait belt    Activity Tolerance Patient tolerated treatment well    Behavior During Therapy WFL for tasks assessed/performed             Past Medical History:  Diagnosis Date   Acid reflux    Cerebral palsy (HCC)     Past Surgical History:  Procedure Laterality Date   dorsal rhizotomy     back ligament looosening surgery   ESOPHAGOGASTRODUODENOSCOPY (EGD) WITH PROPOFOL N/A 10/08/2016   Procedure: ESOPHAGOGASTRODUODENOSCOPY (EGD) WITH PROPOFOL;  Surgeon: Willis Modena, MD;  Location: [facility];  Service: Endoscopy;  Laterality: N/A;   left arm surgery      There were no vitals filed for this visit.   Subjective Assessment - 07/04/21 1453     Subjective Patient not having a good day; had a death in family. No pain to report. Does have mild HA. Is going tomorrow to getting medical report, potential to getting license.    Pertinent History CP, Acid Reflux    Limitations Sitting;Standing;Walking;House hold activities    Patient Stated Goals Improve ROM/mobility in Legs; Standing longer potential for taking steps; improve sliding board transfer.    Currently in Pain? Yes    Pain Score 3     Pain  Location Back    Pain Orientation Lower    Pain Descriptors / Indicators Aching;Discomfort    Pain Type Chronic pain                               [facility] Adult PT Treatment/Exercise - 07/04/21 0001       Transfers   Transfers Sit to Stand;Stand to Sit    Sit to Stand --   with standing frame, RUE used on bar   Stand to Sit --   with standing frame, RUE used on bar     Ambulation/Gait   Pre-Gait Activities Continued use of standing frame, completed standing in with use of assistance from device to work on improved standing tolerance. 1st rep completed standing for 2 minutes, 20 seconds. Patient reports mild discomfort in feet. Patient has inversion at ankle noted due to hip and knee adduction due to tone. Second trial of standing, patient able to stand for 5 minutes, 12 seconds. PT providing cues and facilitation for improved posturing with completion.      Exercises   Exercises Other Exercises    Other Exercises  With patient in chair, PT completed manual stretching to bilat hamstring 2 x 1 minute each, progressing into range. intermittent spasm with overpressure wiht stretch, cues  for deep breathing to relax. Then tried to work on adductor stretch, increased resistance today with stretch.              PT Short Term Goals - 06/05/21 1653       PT SHORT TERM GOAL #1   Title Pt will be able to perform initial HEP for flexibility/ROM, strengthening with assist of aides for improved mobility. (ALL STGs Due: 6 Visit)    Baseline not currently completing HEP established during prior POC consistently    Time 4    Period --   visits   Status On-going      PT SHORT TERM GOAL #2   Title Patient will be able to stand in [place] for >/= 1 minute to demo improved activity tolerance    Baseline TBA    Time 4    Period --   visits   Status On-going               PT Long Term Goals - 06/05/21 1651       PT LONG TERM GOAL #1   Title Pt will be independent  with progress strengthening, flexibility and mobility program for HEP to continue on own. (ALL LTG Due: 10th Visit)    Baseline Not completing HEP consistently    Time 8    Period --   visits   Status New      PT LONG TERM GOAL #2   Title Pt will be able to perform slideboard transfer with mod A for improved ability to participate in transfers and independence.    Baseline Max A    Time 8    Period --   visits   Status New      PT LONG TERM GOAL #3   Title Patient will demo ability to stand in Standing Frame for >/= 2 minutes for improved functional strength/ROM    Baseline TBA    Time 8    Period --   visits   Status New      PT LONG TERM GOAL #4   Title Patient will improve Knee Extension PROM to </= -20 degress bilaterally    Baseline -35 on LLE, -24 on RLE    Time 8    Period --   visits   Status New                   Plan - 07/04/21 1621     Clinical Impression Statement Completed stretching to BLE today for improved positioning and posture in standing, continue to demo significant tone/spasms in hamstring and hip adductors (LLE > RLE). Continued session focused on standing tolerance in standing frame, with patient able to stand for 5 minutes today. Will continue to progress toward all LTGs.    Personal Factors and Comorbidities Comorbidity 2;Time since onset of injury/illness/exacerbation;Transportation    Comorbidities CP, Acid Reflux    Examination-Activity Limitations Bathing;Bed Mobility;Toileting;Transfers;Reach Overhead;Stand;Sit    Examination-Participation Restrictions Interpersonal Relationship    Stability/Clinical Decision Making Stable/Uncomplicated    Rehab Potential Fair    PT Frequency 1x / week    PT Duration 8 weeks    PT Treatment/Interventions ADLs/Self Care Home Management;Cryotherapy;Electrical Stimulation;Moist Heat;DME Instruction;Gait training;Functional mobility training;Therapeutic activities;Therapeutic exercise;Balance  training;Neuromuscular re-education;Patient/family education;Wheelchair mobility training;Manual techniques;Vestibular;Joint Manipulations;Spinal Manipulations;Passive range of motion    PT Next Visit Plan Check STGs + [place]umbit for more MCD visits. Continue standing frame. Continue transfer training and supine stretching.    Consulted and Agree with  Plan of Care Patient             Patient will benefit from skilled therapeutic intervention in order to improve the following deficits and impairments:  Pain, Postural dysfunction, Impaired tone, Decreased mobility, Decreased activity tolerance, Decreased endurance, Decreased range of motion, Decreased strength, Hypomobility, Impaired UE functional use, Impaired flexibility, Difficulty walking  Visit Diagnosis: Muscle weakness (generalized)  Other abnormalities of gait and mobility  Abnormal posture  Contracture of muscle, multiple sites     Problem List Patient Active Problem List   Diagnosis Date Noted   Dysphagia 05/31/2021   Epigastric pain 05/31/2021   Gastroesophageal reflux disease 05/31/2021   Hematemesis 05/31/2021   Hiatal hernia 05/31/2021   Microcytic anemia 05/31/2021   Sagittal band rupture, extensor tendon, nontraumatic, left 05/01/2021   Cerebral palsy (HCC) 04/25/2020   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 [place], [place], [address] Phone: [phone]   Fax:  [phone]  Name: XOCHITL EGLE MRN: [id number] Date of Birth:  06-Jun-1986

## 2021-07-11 ENCOUNTER — Ambulatory Visit: Payer: Medicaid Other

## 2021-07-11 ENCOUNTER — Other Ambulatory Visit: Payer: Self-pay

## 2021-07-11 DIAGNOSIS — M6281 Muscle weakness (generalized): Secondary | ICD-10-CM | POA: Diagnosis not present

## 2021-07-11 DIAGNOSIS — M6249 Contracture of muscle, multiple sites: Secondary | ICD-10-CM

## 2021-07-11 DIAGNOSIS — R2689 Other abnormalities of gait and mobility: Secondary | ICD-10-CM

## 2021-07-11 DIAGNOSIS — R293 Abnormal posture: Secondary | ICD-10-CM

## 2021-07-11 NOTE — Therapy (Signed)
[facility] [address] [place], [place], [address] Phone: [phone]   Fax:  [phone]  Physical Therapy Treatment  Patient Details  Name: Christine Gomez MRN: [id number] Date of Birth: February 19, 1986 Referring Provider (PT): Everardo Beals, NP   Encounter Date: 07/11/2021   PT End of Session - 07/11/21 1443     Visit Number 5    Number of Visits 10    Date for PT Re-Evaluation --   at 10th visit   Authorization Type Traditional MCD (VL: 4)    Authorization Time Period Authorization for Initial 3 Visits (6/30-7/20); resubmit for additional 5 visits, awaiting authorization    Authorization - Visit Number 3    Authorization - Number of Visits 3    PT Start Time 1441    PT Stop Time 1528    PT Time Calculation (min) 47 min    Equipment Utilized During Treatment Gait belt    Activity Tolerance Patient tolerated treatment well    Behavior During Therapy [facility] for tasks assessed/performed             Past Medical History:  Diagnosis Date   Acid reflux    Cerebral palsy ([place])     Past Surgical History:  Procedure Laterality Date   dorsal rhizotomy     back ligament looosening surgery   ESOPHAGOGASTRODUODENOSCOPY (EGD) WITH PROPOFOL N/A 10/08/2016   Procedure: ESOPHAGOGASTRODUODENOSCOPY (EGD) WITH PROPOFOL;  Surgeon: Arta Silence, MD;  Location: [facility];  Service: Endoscopy;  Laterality: N/A;   left arm surgery      There were no vitals filed for this visit.   Subjective Assessment - 07/11/21 1444     Subjective Patient reports some cramps and back pain due to mentrual cycle. Reports she has been having some intermittent pain in the R hip.    Pertinent History CP, Acid Reflux    Limitations Sitting;Standing;Walking;House hold activities    Patient Stated Goals Improve ROM/mobility in Legs; Standing longer potential for taking steps; improve sliding board transfer.    Currently in Pain? Yes     Pain Score 3     Pain Location Back    Pain Orientation Lower    Pain Descriptors / Indicators Aching;Discomfort    Pain Type Chronic pain                [facility] Adult PT Treatment/Exercise - 07/11/21 0001       Transfers   Transfers Lateral/Scoot Transfers    Lateral/Scoot Transfers With slide board;2: Max assist;3: Mod assist    Lateral/Scoot Transfer Details (indicate cue type and reason) completed lateral slide board transfer from power chair <> mat, patient continue to require Mod - Max A for completion. PT continues to place sliding board, but patient able to complete lateral weight shift to offload hip to assist with placement. Intermittent cues for core activation and close supervision to return to upright to return midline. PT providing verbal cues and faciliation for anterior weight shift to promote improved off loading. Slow transfer to allow for patient's improved ability to participate in transfer. Continue to demo difficulty with hand placement to allow for pushing with RUE.      Neuro Re-ed    Neuro Re-ed Details  with patient supine and BLE placed on bolster: completed contract/relax to adductors stretching into range with relaxation phase, completed x 5 reps bilaterally. With patient supine completed isometric abduction into therapist hand x 3 reps bilat with 3-5 second hold. minimal  movement but palpable contraction noted.      Exercises   Exercises Other Exercises    Other Exercises  With patient supine: PT completed manual stretching to bilat hamstring 3 x 30-45 seconds progressing into range, limited on L side due to spasms today. Also completed bilat DF stretch 2 x 30 seconds. Then completed manual passive adductor stretch progressing into range x 30-45 seconds bilaterally.                PT Education - 07/11/21 1606     Education Details progress toward STGs    Person(s) Educated Patient    Methods Explanation    Comprehension Verbalized understanding               PT Short Term Goals - 07/11/21 1446       PT SHORT TERM GOAL #1   Title Pt will be able to perform initial HEP for flexibility/ROM, strengthening with assist of aides for improved mobility. (ALL STGs Due: 6 Visit)    Baseline not completing consitently due to lack of assist from aide; working toward improved ability to self stretch as able    Time 4    Period --   visits   Status Partially Met      PT SHORT TERM GOAL #2   Title Patient will be able to stand in [place] for >/= 1 minute to demo improved activity tolerance    Baseline TBA; Tolerated 4-5 mins on 7/7    Time 4    Period --   visits   Status Achieved               PT Long Term Goals - 06/05/21 1651       PT LONG TERM GOAL #1   Title Pt will be independent with progress strengthening, flexibility and mobility program for HEP to continue on own. (ALL LTG Due: 10th Visit)    Baseline Not completing HEP consistently    Time 8    Period --   visits   Status New      PT LONG TERM GOAL #2   Title Pt will be able to perform slideboard transfer with mod A for improved ability to participate in transfers and independence.    Baseline Max A    Time 8    Period --   visits   Status New      PT LONG TERM GOAL #3   Title Patient will demo ability to stand in Standing Frame for >/= 2 minutes for improved functional strength/ROM    Baseline TBA    Time 8    Period --   visits   Status New      PT LONG TERM GOAL #4   Title Patient will improve Knee Extension PROM to </= -20 degress bilaterally    Baseline -35 on LLE, -24 on RLE    Time 8    Period --   visits   Status New                   Plan - 07/11/21 1446     Clinical Impression Statement Today's skilled PT session focused on assesment of patient's progress toward STG. Patient demo improved standing activity tolerance, able to stand for approx 4 minutes in stander on last session. Limited participation in HEP at home due to  decreased freq of assistance to allow for completion. Continud transfer training with sliding board today during sesion, as well as  continued stretching to promote improved positioning and flexibility. Will continue to progress toward all LTGs.    Personal Factors and Comorbidities Comorbidity 2;Time since onset of injury/illness/exacerbation;Transportation    Comorbidities CP, Acid Reflux    Examination-Activity Limitations Bathing;Bed Mobility;Toileting;Transfers;Reach Overhead;Stand;Sit    Examination-Participation Restrictions Interpersonal Relationship    Stability/Clinical Decision Making Stable/Uncomplicated    Rehab Potential Fair    PT Frequency 1x / week    PT Duration 8 weeks    PT Treatment/Interventions ADLs/Self Care Home Management;Cryotherapy;Electrical Stimulation;Moist Heat;DME Instruction;Gait training;Functional mobility training;Therapeutic activities;Therapeutic exercise;Balance training;Neuromuscular re-education;Patient/family education;Wheelchair mobility training;Manual techniques;Vestibular;Joint Manipulations;Spinal Manipulations;Passive range of motion    PT Next Visit Plan Continue transfer training and supine stretching. (focus on hamstring, DF, adductors). Standing Frame.    Consulted and Agree with Plan of Care Patient             Patient will benefit from skilled therapeutic intervention in order to improve the following deficits and impairments:  Pain, Postural dysfunction, Impaired tone, Decreased mobility, Decreased activity tolerance, Decreased endurance, Decreased range of motion, Decreased strength, Hypomobility, Impaired UE functional use, Impaired flexibility, Difficulty walking  Visit Diagnosis: Muscle weakness (generalized)  Other abnormalities of gait and mobility  Abnormal posture  Contracture of muscle, multiple sites     Problem List Patient Active Problem List   Diagnosis Date Noted   Dysphagia 05/31/2021   Epigastric pain  05/31/2021   Gastroesophageal reflux disease 05/31/2021   Hematemesis 05/31/2021   Hiatal hernia 05/31/2021   Microcytic anemia 05/31/2021   Sagittal band rupture, extensor tendon, nontraumatic, left 05/01/2021   Cerebral palsy ([place]) 04/25/2020   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 [place], [place], [address] Phone: [phone]   Fax:  [phone]  Name: Christine Gomez MRN: [id number] Date of Birth: [birth year]

## 2021-07-18 ENCOUNTER — Ambulatory Visit: Payer: Medicaid Other | Admitting: Physical Therapy

## 2021-07-25 ENCOUNTER — Ambulatory Visit: Payer: Medicaid Other

## 2021-08-01 ENCOUNTER — Ambulatory Visit: Payer: Medicaid Other

## 2021-08-08 ENCOUNTER — Ambulatory Visit: Payer: Medicaid Other | Attending: *Deleted

## 2021-08-08 ENCOUNTER — Other Ambulatory Visit: Payer: Self-pay

## 2021-08-08 DIAGNOSIS — M6281 Muscle weakness (generalized): Secondary | ICD-10-CM | POA: Insufficient documentation

## 2021-08-08 DIAGNOSIS — R293 Abnormal posture: Secondary | ICD-10-CM | POA: Insufficient documentation

## 2021-08-08 DIAGNOSIS — M6249 Contracture of muscle, multiple sites: Secondary | ICD-10-CM | POA: Insufficient documentation

## 2021-08-08 DIAGNOSIS — R2689 Other abnormalities of gait and mobility: Secondary | ICD-10-CM | POA: Diagnosis present

## 2021-08-08 NOTE — Therapy (Signed)
[facility] [address] [place], [place], [address] Phone: [phone]   Fax:  [phone]  Physical Therapy Treatment  Patient Details  Name: Christine Gomez MRN: [id number] Date of Birth: 05-Dec-1986 Referring Provider (PT): Everardo Beals, NP   Encounter Date: 08/08/2021   PT End of Session - 08/08/21 1417     Visit Number 6    Number of Visits 10    Date for PT Re-Evaluation --   at 10th visit   Authorization Type Traditional MCD (VL: 75)    Authorization Time Period Authorization for Initial 3 Visits (6/30-7/20); resubmit for additional 5 visits, awaiting authorization    Authorization - Visit Number 3    Authorization - Number of Visits 3    PT Start Time 1413   patient arriving late   PT Stop Time 1445    PT Time Calculation (min) 32 min    Equipment Utilized During Treatment Gait belt    Activity Tolerance Patient tolerated treatment well    Behavior During Therapy [facility] for tasks assessed/performed             Past Medical History:  Diagnosis Date   Acid reflux    Cerebral palsy ([place])     Past Surgical History:  Procedure Laterality Date   dorsal rhizotomy     back ligament looosening surgery   ESOPHAGOGASTRODUODENOSCOPY (EGD) WITH PROPOFOL N/A 10/08/2016   Procedure: ESOPHAGOGASTRODUODENOSCOPY (EGD) WITH PROPOFOL;  Surgeon: Arta Silence, MD;  [facility];  Service: Endoscopy;  Laterality: N/A;   left arm surgery      There were no vitals filed for this visit.   Subjective Assessment - 08/08/21 1416     Subjective Patient denies new change. Patient reports some hip pain on the R side, beleives it is due not to stretching.    Pertinent History CP, Acid Reflux    Limitations Sitting;Standing;Walking;House hold activities    Patient Stated Goals Improve ROM/mobility in Legs; Standing longer potential for taking steps; improve sliding board transfer.    Currently in Pain? Yes     Pain Score 1     Pain Location Hip    Pain Orientation Right    Pain Descriptors / Indicators Aching;Discomfort    Pain Type Chronic pain               [facility] Adult PT Treatment/Exercise - 08/08/21 0001       Transfers   Sit to Stand Other (comment)   with use of standing frame and belt to help assist into standing   Stand to Sit Other (comment)   with use of standing frame and belt to help lower into seated position   Comments upon standing cues for upright posture, faciliation for improved knee positioning.      Ambulation/Gait   Pre-Gait Activities With use of standing frame: completed standing in with use of assistance from device to work on improved standing tolerance. 1st rep completed standing for 2 minutes, 45  seconds.Second trial of standing, patient able to stand for 2 minutes, 33 seconds. PT providing cues and facilitation for improved posturing with completion. seated rest breaks required between standing.              PT Education - 08/08/21 1457     Education Details PT educating on VL, patient to determine how she would like to allocate remaining visits due to MCD VL    Person(s) Educated Patient    Methods Explanation  Comprehension Verbalized understanding              PT Short Term Goals - 07/11/21 1446       PT SHORT TERM GOAL #1   Title Pt will be able to perform initial HEP for flexibility/ROM, strengthening with assist of aides for improved mobility. (ALL STGs Due: 6 Visit)    Baseline not completing consitently due to lack of assist from aide; working toward improved ability to self stretch as able    Time 4    Period --   visits   Status Partially Met      PT SHORT TERM GOAL #2   Title Patient will be able to stand in [place] for >/= 1 minute to demo improved activity tolerance    Baseline TBA; Tolerated 4-5 mins on 7/7    Time 4    Period --   visits   Status Achieved               PT Long Term Goals - 06/05/21 1651        PT LONG TERM GOAL #1   Title Pt will be independent with progress strengthening, flexibility and mobility program for HEP to continue on own. (ALL LTG Due: 10th Visit)    Baseline Not completing HEP consistently    Time 8    Period --   visits   Status New      PT LONG TERM GOAL #2   Title Pt will be able to perform slideboard transfer with mod A for improved ability to participate in transfers and independence.    Baseline Max A    Time 8    Period --   visits   Status New      PT LONG TERM GOAL #3   Title Patient will demo ability to stand in Standing Frame for >/= 2 minutes for improved functional strength/ROM    Baseline TBA    Time 8    Period --   visits   Status New      PT LONG TERM GOAL #4   Title Patient will improve Knee Extension PROM to </= -20 degress bilaterally    Baseline -35 on LLE, -24 on RLE    Time 8    Period --   visits   Status New                   Plan - 08/08/21 1457     Clinical Impression Statement Session limited due to patient arriving late. Continued standing tolerance in standing frame today working on increased standing time, patietn tolerating well. Cues for upright posture. PT had to have hard frank conversation regarding limited PT visits remaining due to VL. Pt to decide how to allocate these visits and inform PT at next session.    Personal Factors and Comorbidities Comorbidity 2;Time since onset of injury/illness/exacerbation;Transportation    Comorbidities CP, Acid Reflux    Examination-Activity Limitations Bathing;Bed Mobility;Toileting;Transfers;Reach Overhead;Stand;Sit    Examination-Participation Restrictions Interpersonal Relationship    Stability/Clinical Decision Making Stable/Uncomplicated    Rehab Potential Fair    PT Frequency 1x / week    PT Duration 8 weeks    PT Treatment/Interventions ADLs/Self Care Home Management;Cryotherapy;Electrical Stimulation;Moist Heat;DME Instruction;Gait training;Functional mobility  training;Therapeutic activities;Therapeutic exercise;Balance training;Neuromuscular re-education;Patient/family education;Wheelchair mobility training;Manual techniques;Vestibular;Joint Manipulations;Spinal Manipulations;Passive range of motion    PT Next Visit Plan Continue transfer training and supine stretching. (focus on hamstring, DF, adductors). Standing Frame.    Consulted  and Agree with Plan of Care Patient             Patient will benefit from skilled therapeutic intervention in order to improve the following deficits and impairments:  Pain, Postural dysfunction, Impaired tone, Decreased mobility, Decreased activity tolerance, Decreased endurance, Decreased range of motion, Decreased strength, Hypomobility, Impaired UE functional use, Impaired flexibility, Difficulty walking  Visit Diagnosis: Muscle weakness (generalized)  Other abnormalities of gait and mobility  Abnormal posture  Contracture of muscle, multiple sites     Problem List Patient Active Problem List   Diagnosis Date Noted   Dysphagia 05/31/2021   Epigastric pain 05/31/2021   Gastroesophageal reflux disease 05/31/2021   Hematemesis 05/31/2021   Hiatal hernia 05/31/2021   Microcytic anemia 05/31/2021   Sagittal band rupture, extensor tendon, nontraumatic, left 05/01/2021   Cerebral palsy ([place]) 04/25/2020   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

## 2021-08-14 ENCOUNTER — Ambulatory Visit: Payer: Medicaid Other

## 2021-08-14 ENCOUNTER — Other Ambulatory Visit: Payer: Self-pay

## 2021-08-14 DIAGNOSIS — R293 Abnormal posture: Secondary | ICD-10-CM

## 2021-08-14 DIAGNOSIS — R2689 Other abnormalities of gait and mobility: Secondary | ICD-10-CM

## 2021-08-14 DIAGNOSIS — M6249 Contracture of muscle, multiple sites: Secondary | ICD-10-CM

## 2021-08-14 DIAGNOSIS — M6281 Muscle weakness (generalized): Secondary | ICD-10-CM | POA: Diagnosis not present

## 2021-08-14 NOTE — Therapy (Signed)
[facility] [address] [place], [place], [address] Phone: [phone]   Fax:  [phone]  Physical Therapy Treatment  Patient Details  Name: Christine Gomez MRN: [id number] Date of Birth: Nov 09, 1986 Referring Provider (PT): Everardo Beals, NP   Encounter Date: 08/14/2021   PT End of Session - 08/14/21 1410     Visit Number 7    Number of Visits 10    Date for PT Re-Evaluation --   at 10th visit   Authorization Type Traditional MCD (VL: 27)    Authorization Time Period Authorization for Initial 3 Visits (6/30-7/20); Authorization for 4 visits (7/28 - 8/31)    Authorization - Visit Number 2    Authorization - Number of Visits 4    PT Start Time 3976   patient arriving late   PT Stop Time 1445    PT Time Calculation (min) 40 min    Equipment Utilized During Treatment Gait belt    Activity Tolerance Patient tolerated treatment well    Behavior During Therapy [facility] for tasks assessed/performed             Past Medical History:  Diagnosis Date   Acid reflux    Cerebral palsy ([place])     Past Surgical History:  Procedure Laterality Date   dorsal rhizotomy     back ligament looosening surgery   ESOPHAGOGASTRODUODENOSCOPY (EGD) WITH PROPOFOL N/A 10/08/2016   Procedure: ESOPHAGOGASTRODUODENOSCOPY (EGD) WITH PROPOFOL;  Surgeon: Arta Silence, MD;  [facility];  Service: Endoscopy;  Laterality: N/A;   left arm surgery      There were no vitals filed for this visit.   Subjective Assessment - 08/14/21 1408     Subjective Patient reports feeling fatigued today due to menstrual cycle. Reports some stomach pain.    Pertinent History CP, Acid Reflux    Limitations Sitting;Standing;Walking;House hold activities    Patient Stated Goals Improve ROM/mobility in Legs; Standing longer potential for taking steps; improve sliding board transfer.    Currently in Pain? Yes    Pain Score 4     Pain Location  Generalized    Pain Orientation Other (Comment)   generalized all over   Pain Descriptors / Indicators Discomfort    Pain Type Acute pain    Pain Frequency Intermittent                               [facility] Adult PT Treatment/Exercise - 08/14/21 0001       Transfers   Transfers Lateral/Scoot Transfers    Lateral/Scoot Transfers With slide board;2: Max assist;3: Mod assist    Lateral/Scoot Transfer Details (indicate cue type and reason) completed lateral slide board transfer from power chair <> mat, patient require Mod - Max A for completion. patient completing lateral weight shift to offload hip to assist with placement, and PT assisting with placement. PT providing cues for forward lenaa nd posture. With transfer to mat, Mod A reuqired, but going back to chair require Max A.      Neuro Re-ed    Neuro Re-ed Details  seated on mat with wedge + 2 pillows completd core activation working on coming from elevated position to upright 2 x 5 reps. Then completed lateral core activation/oblique with lateral flexion onto RUE on pillow  x 5 reps then working on coming into upright position, cues for upright posture and faciliation. Then completed x 5 reps with pillow  going onto LUE and working toward upright, increased challenge noted. Then with maintaining upright position completed reaching forward with LUE for cone, and with RUE for cone in various directions. increased challenge still letting go of cone with LUE, assistance required.                      PT Short Term Goals - 07/11/21 1446       PT SHORT TERM GOAL #1   Title Pt will be able to perform initial HEP for flexibility/ROM, strengthening with assist of aides for improved mobility. (ALL STGs Due: 6 Visit)    Baseline not completing consitently due to lack of assist from aide; working toward improved ability to self stretch as able    Time 4    Period --   visits   Status Partially Met      PT SHORT TERM  GOAL #2   Title Patient will be able to stand in [place] for >/= 1 minute to demo improved activity tolerance    Baseline TBA; Tolerated 4-5 mins on 7/7    Time 4    Period --   visits   Status Achieved               PT Long Term Goals - 06/05/21 1651       PT LONG TERM GOAL #1   Title Pt will be independent with progress strengthening, flexibility and mobility program for HEP to continue on own. (ALL LTG Due: 10th Visit)    Baseline Not completing HEP consistently    Time 8    Period --   visits   Status New      PT LONG TERM GOAL #2   Title Pt will be able to perform slideboard transfer with mod A for improved ability to participate in transfers and independence.    Baseline Max A    Time 8    Period --   visits   Status New      PT LONG TERM GOAL #3   Title Patient will demo ability to stand in Standing Frame for >/= 2 minutes for improved functional strength/ROM    Baseline TBA    Time 8    Period --   visits   Status New      PT LONG TERM GOAL #4   Title Patient will improve Knee Extension PROM to </= -20 degress bilaterally    Baseline -35 on LLE, -24 on RLE    Time 8    Period --   visits   Status New                   Plan - 08/14/21 1453     Clinical Impression Statement Continued transfer training with sliding board, continue to require Mod - Max A. Upon seated on mat, continued NMR working on improved core activation and lateral weight shift for improved transfer training. Will continue to progress toward all LTGs.    Personal Factors and Comorbidities Comorbidity 2;Time since onset of injury/illness/exacerbation;Transportation    Comorbidities CP, Acid Reflux    Examination-Activity Limitations Bathing;Bed Mobility;Toileting;Transfers;Reach Overhead;Stand;Sit    Examination-Participation Restrictions Interpersonal Relationship    Stability/Clinical Decision Making Stable/Uncomplicated    Rehab Potential Fair    PT Frequency 1x / week    PT  Duration 8 weeks    PT Treatment/Interventions ADLs/Self Care Home Management;Cryotherapy;Electrical Stimulation;Moist Heat;DME Instruction;Gait training;Functional mobility training;Therapeutic activities;Therapeutic exercise;Balance training;Neuromuscular re-education;Patient/family education;Wheelchair mobility training;Manual  techniques;Vestibular;Joint Manipulations;Spinal Manipulations;Passive range of motion    PT Next Visit Plan Continue transfer training and supine stretching. (focus on hamstring, DF, adductors). Core Activation.  Standing Frame.    Consulted and Agree with Plan of Care Patient             Patient will benefit from skilled therapeutic intervention in order to improve the following deficits and impairments:  Pain, Postural dysfunction, Impaired tone, Decreased mobility, Decreased activity tolerance, Decreased endurance, Decreased range of motion, Decreased strength, Hypomobility, Impaired UE functional use, Impaired flexibility, Difficulty walking  Visit Diagnosis: Muscle weakness (generalized)  Other abnormalities of gait and mobility  Abnormal posture  Contracture of muscle, multiple sites     Problem List Patient Active Problem List   Diagnosis Date Noted   Dysphagia 05/31/2021   Epigastric pain 05/31/2021   Gastroesophageal reflux disease 05/31/2021   Hematemesis 05/31/2021   Hiatal hernia 05/31/2021   Microcytic anemia 05/31/2021   Sagittal band rupture, extensor tendon, nontraumatic, left 05/01/2021   Cerebral palsy ([place]) 04/25/2020   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 [place], [place], [address] Phone: [phone]   Fax:  [phone]  Name: Christine Gomez MRN: [id number] Date of Birth: 03-Apr-1986

## 2021-08-22 ENCOUNTER — Other Ambulatory Visit: Payer: Self-pay

## 2021-08-22 ENCOUNTER — Ambulatory Visit: Payer: Medicaid Other

## 2021-08-22 DIAGNOSIS — M6281 Muscle weakness (generalized): Secondary | ICD-10-CM | POA: Diagnosis not present

## 2021-08-22 DIAGNOSIS — R2689 Other abnormalities of gait and mobility: Secondary | ICD-10-CM

## 2021-08-22 DIAGNOSIS — M6249 Contracture of muscle, multiple sites: Secondary | ICD-10-CM

## 2021-08-22 DIAGNOSIS — R293 Abnormal posture: Secondary | ICD-10-CM

## 2021-08-22 NOTE — Therapy (Signed)
[facility] [address] [place], [place], [address] Phone: [phone]   Fax:  [phone]  Physical Therapy Treatment  Patient Details  Name: Christine Gomez MRN: [id number] Date of Birth: [birth year] Referring Provider (PT): Everardo Beals, NP   Encounter Date: 08/22/2021   PT End of Session - 08/22/21 1500     Visit Number 8    Number of Visits 10    Date for PT Re-Evaluation --   at 10th visit   Authorization Type Traditional MCD (VL: 27)    Authorization Time Period Authorization for Initial 3 Visits (6/30-7/20); Authorization for 4 visits (7/28 - 8/31)    Authorization - Visit Number 3    Authorization - Number of Visits 4    PT Start Time 9476   patient arriving late   PT Stop Time 1531    PT Time Calculation (min) 33 min    Equipment Utilized During Treatment Gait belt    Activity Tolerance Patient tolerated treatment well    Behavior During Therapy WFL for tasks assessed/performed             Past Medical History:  Diagnosis Date   Acid reflux    Cerebral palsy ([place])     Past Surgical History:  Procedure Laterality Date   dorsal rhizotomy     back ligament looosening surgery   ESOPHAGOGASTRODUODENOSCOPY (EGD) WITH PROPOFOL N/A 10/08/2016   Procedure: ESOPHAGOGASTRODUODENOSCOPY (EGD) WITH PROPOFOL;  Surgeon: Arta Silence, MD;  Location: [facility];  Service: Endoscopy;  Laterality: N/A;   left arm surgery      There were no vitals filed for this visit.   Subjective Assessment - 08/22/21 1500     Subjective Patient reports no new changes. Ride was late.    Pertinent History CP, Acid Reflux    Limitations Sitting;Standing;Walking;House hold activities    Patient Stated Goals Improve ROM/mobility in Legs; Standing longer potential for taking steps; improve sliding board transfer.    Currently in Pain? No/denies             [facility] Adult PT Treatment/Exercise - 08/22/21 0001        Ambulation/Gait   Pre-Gait Activities With use of standing frame: completed standing in with use of assistance from device to work on improved standing tolerance. 1st rep completed standing for 3  minutes, 12  seconds. Second trial of standing, patient able to stand for 3 minutes, 45 seconds. PT providing cues and facilitation for improved posturing with completion. As well as working on engaging hip extensors for upright from sling. Paitent require seated rest breaks between standing trials. Completed reaching activity in standing working on improved weight shifting, completed x 10 reps.              PT Education - 08/22/21 1531     Education Details progress toward LTGs    Person(s) Educated Patient    Methods Explanation    Comprehension Verbalized understanding              PT Short Term Goals - 07/11/21 1446       PT SHORT TERM GOAL #1   Title Pt will be able to perform initial HEP for flexibility/ROM, strengthening with assist of aides for improved mobility. (ALL STGs Due: 6 Visit)    Baseline not completing consitently due to lack of assist from aide; working toward improved ability to self stretch as able    Time 4    Period --  visits   Status Partially Met      PT SHORT TERM GOAL #2   Title Patient will be able to stand in [place] for >/= 1 minute to demo improved activity tolerance    Baseline TBA; Tolerated 4-5 mins on 7/7    Time 4    Period --   visits   Status Achieved               PT Long Term Goals - 06/05/21 1651       PT LONG TERM GOAL #1   Title Pt will be independent with progress strengthening, flexibility and mobility program for HEP to continue on own. (ALL LTG Due: 10th Visit)    Baseline Not completing HEP consistently    Time 8    Period --   visits   Status New      PT LONG TERM GOAL #2   Title Pt will be able to perform slideboard transfer with mod A for improved ability to participate in transfers and independence.    Baseline  Max A    Time 8    Period --   visits   Status New      PT LONG TERM GOAL #3   Title Patient will demo ability to stand in Standing Frame for >/= 2 minutes for improved functional strength/ROM    Baseline TBA    Time 8    Period --   visits   Status New      PT LONG TERM GOAL #4   Title Patient will improve Knee Extension PROM to </= -20 degress bilaterally    Baseline -35 on LLE, -24 on RLE    Time 8    Period --   visits   Status New              Plan - 08/22/21 1532     Clinical Impression Statement Session limited due to patient arriving late becuase of transportation. Session focused on contineud standing tolerance and positioning in standing with use of standing frame. Incorporating reaching activity for weight shift. Paitent tolerating well. WIll continue to progress toward all LTGs.    Personal Factors and Comorbidities Comorbidity 2;Time since onset of injury/illness/exacerbation;Transportation    Comorbidities CP, Acid Reflux    Examination-Activity Limitations Bathing;Bed Mobility;Toileting;Transfers;Reach Overhead;Stand;Sit    Examination-Participation Restrictions Interpersonal Relationship    Stability/Clinical Decision Making Stable/Uncomplicated    Rehab Potential Fair    PT Frequency 1x / week    PT Duration 8 weeks    PT Treatment/Interventions ADLs/Self Care Home Management;Cryotherapy;Electrical Stimulation;Moist Heat;DME Instruction;Gait training;Functional mobility training;Therapeutic activities;Therapeutic exercise;Balance training;Neuromuscular re-education;Patient/family education;Wheelchair mobility training;Manual techniques;Vestibular;Joint Manipulations;Spinal Manipulations;Passive range of motion    PT Next Visit Plan Continue transfer training and supine stretching. (focus on hamstring, DF, adductors). Core Activation.  Standing Frame.    Consulted and Agree with Plan of Care Patient             Patient will benefit from skilled  therapeutic intervention in order to improve the following deficits and impairments:  Pain, Postural dysfunction, Impaired tone, Decreased mobility, Decreased activity tolerance, Decreased endurance, Decreased range of motion, Decreased strength, Hypomobility, Impaired UE functional use, Impaired flexibility, Difficulty walking  Visit Diagnosis: Other abnormalities of gait and mobility  Muscle weakness (generalized)  Abnormal posture  Contracture of muscle, multiple sites     Problem List Patient Active Problem List   Diagnosis Date Noted   Dysphagia 05/31/2021   Epigastric pain 05/31/2021  Gastroesophageal reflux disease 05/31/2021   Hematemesis 05/31/2021   Hiatal hernia 05/31/2021   Microcytic anemia 05/31/2021   Sagittal band rupture, extensor tendon, nontraumatic, left 05/01/2021   Cerebral palsy ([place]) 04/25/2020   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 [place], [place], [address] Phone: [phone]   Fax:  [phone]  Name: Christine Gomez MRN: [id number] Date of Birth: [birth year]

## 2021-08-26 ENCOUNTER — Encounter: Payer: Medicaid Other | Admitting: Advanced Practice Midwife

## 2021-08-27 ENCOUNTER — Other Ambulatory Visit: Payer: Self-pay

## 2021-08-27 ENCOUNTER — Ambulatory Visit: Payer: Medicaid Other

## 2021-08-27 DIAGNOSIS — M6281 Muscle weakness (generalized): Secondary | ICD-10-CM

## 2021-08-27 DIAGNOSIS — R2689 Other abnormalities of gait and mobility: Secondary | ICD-10-CM

## 2021-08-27 DIAGNOSIS — M6249 Contracture of muscle, multiple sites: Secondary | ICD-10-CM

## 2021-08-27 DIAGNOSIS — R293 Abnormal posture: Secondary | ICD-10-CM

## 2021-08-27 NOTE — Patient Instructions (Signed)
Access Code: [email or web address] URL: [email or web address] Date: 08/27/2021 Prepared by: Jethro Bastos  Exercises Supine Ankle Dorsiflexion Stretch with Caregiver - 2 x daily - 7 x weekly - 3 sets - 30 hold Supine Hamstring Stretch with Caregiver - 2 x daily - 7 x weekly - 3 sets - 30 hold Hooklying Gluteal Sets - 2 x daily - 7 x weekly - 2 sets - 10 reps Hip and Knee Extension and Flexion Caregiver PROM - 2 x daily - 7 x weekly - 1 sets - 10 reps Seated Hip Abduction - 1 x daily - 7 x weekly - 1 sets - 10 reps Hooklying Isometric Clamshell - 1 x daily - 7 x weekly - 1 sets - 10 reps

## 2021-08-27 NOTE — Therapy (Signed)
[facility] [address] [place], [place], [address] Phone: [phone]   Fax:  [phone]  Physical Therapy Treatment/Discharge Summary  Patient Details  Name: Christine Gomez MRN: [id number] Date of Birth: [birth year] Referring Provider (PT): Everardo Beals, NP  PHYSICAL THERAPY DISCHARGE SUMMARY  Visits from Start of Care: 9  Current functional level related to goals / functional outcomes: See Clinical Impression Statement   Remaining deficits: Contractures of BLE; Abnormal Posture; Decreased functional mobility   Education / Equipment: HEP   Patient agrees to discharge. Patient goals were met. Patient is being discharged due to meeting the stated rehab goals.   Encounter Date: 08/27/2021   PT End of Session - 08/27/21 1535     Visit Number 9    Number of Visits 10    Date for PT Re-Evaluation --   at 10th visit   Authorization Type Traditional MCD (VL: 27)    Authorization Time Period Authorization for Initial 3 Visits (6/30-7/20); Authorization for 4 visits (7/28 - 8/31)    Authorization - Visit Number 4    Authorization - Number of Visits 4    PT Start Time 1532    PT Stop Time 4680    PT Time Calculation (min) 43 min    Equipment Utilized During Treatment Gait belt    Activity Tolerance Patient tolerated treatment well    Behavior During Therapy WFL for tasks assessed/performed             Past Medical History:  Diagnosis Date   Acid reflux    Cerebral palsy ([place])     Past Surgical History:  Procedure Laterality Date   dorsal rhizotomy     back ligament looosening surgery   ESOPHAGOGASTRODUODENOSCOPY (EGD) WITH PROPOFOL N/A 10/08/2016   Procedure: ESOPHAGOGASTRODUODENOSCOPY (EGD) WITH PROPOFOL;  Surgeon: Arta Silence, MD;  Location: [facility];  Service: Endoscopy;  Laterality: N/A;   left arm surgery      There were no vitals filed for this visit.   Subjective Assessment -  08/27/21 1535     Subjective No new changes/complaints. Reports mild HA.    Pertinent History CP, Acid Reflux    Limitations Sitting;Standing;Walking;House hold activities    Patient Stated Goals Improve ROM/mobility in Legs; Standing longer potential for taking steps; improve sliding board transfer.    Currently in Pain? Yes    Pain Score 3     Pain Location Head    Pain Orientation Anterior    Pain Descriptors / Indicators Headache    Pain Type Acute pain                [facility] PT Assessment - 08/27/21 0001       Assessment   Medical Diagnosis Cerebral Palsy    Referring Provider (PT) Everardo Beals, NP      PROM   Overall PROM  Deficits    Right/Left Knee Right;Left    Right Knee Extension -21    Left Knee Extension -29              [facility] Adult PT Treatment/Exercise - 08/27/21 0001       Transfers   Transfers Lateral/Scoot Transfers    Lateral/Scoot Transfers With slide board;2: Max assist;3: Mod assist    Lateral/Scoot Transfer Details (indicate cue type and reason) completed lateral slide board transfer from power chair <> mat, patient require Mod - Max A for completion. Patient completing lateral weight shift to offload hip to assist with  placement, and PT assisting with placement. Patient unable to place due to UE involvement. Continued verbal cues for hand placement, as well as for forward lean  nd posture. With transfer to mat, Mod A reuqired, but going back to chair require Max A.      Exercises   Other Exercises  Reviewed HEP and provided new handout. PT completed manual stretching ot adductors 2 x 30 seconds each.            Access Code: [email or web address] URL: [email or web address] Date: 08/27/2021 Prepared by: Baldomero Lamy   Exercises Supine Ankle Dorsiflexion Stretch with Caregiver - 2 x daily - 7 x weekly - 3 sets - 30 hold Supine Hamstring Stretch with Caregiver - 2 x daily - 7 x weekly - 3 sets - 30 hold Hooklying Gluteal Sets - 2  x daily - 7 x weekly - 2 sets - 10 reps Hip and Knee Extension and Flexion Caregiver PROM - 2 x daily - 7 x weekly - 1 sets - 10 reps Seated Hip Abduction - 1 x daily - 7 x weekly - 1 sets - 10 reps Hooklying Isometric Clamshell - 1 x daily - 7 x weekly - 1 sets - 10 reps   PT Education - 08/27/21 1628     Education Details progress toward LTG; HEP Update    Person(s) Educated Patient    Methods Explanation;Demonstration;Handout    Comprehension Verbalized understanding              PT Short Term Goals - 07/11/21 1446       PT SHORT TERM GOAL #1   Title Pt will be able to perform initial HEP for flexibility/ROM, strengthening with assist of aides for improved mobility. (ALL STGs Due: 6 Visit)    Baseline not completing consitently due to lack of assist from aide; working toward improved ability to self stretch as able    Time 4    Period --   visits   Status Partially Met      PT SHORT TERM GOAL #2   Title Patient will be able to stand in [place] for >/= 1 minute to demo improved activity tolerance    Baseline TBA; Tolerated 4-5 mins on 7/7    Time 4    Period --   visits   Status Achieved               PT Long Term Goals - 08/27/21 1537       PT LONG TERM GOAL #1   Title Pt will be independent with progress strengthening, flexibility and mobility program for HEP to continue on own. (ALL LTG Due: 10th Visit)    Baseline reports understanding, needs assistance and has inconsitent caregiver/family member completion to allow for daily completion    Time 8    Period --   visits   Status Not Met      PT LONG TERM GOAL #2   Title Pt will be able to perform slideboard transfer with mod A for improved ability to participate in transfers and independence.    Baseline Max A- Mod A    Time 8    Period --   visits   Status Partially Met      PT LONG TERM GOAL #3   Title Patient will demo ability to stand in Standing Frame for >/= 2 minutes for improved functional  strength/ROM    Baseline TBA, 3 mins 45 seconds    Time 8  Period --   visits   Status Achieved      PT LONG TERM GOAL #4   Title Patient will improve Knee Extension PROM to </= -20 degress bilaterally    Baseline -35 on LLE, -24 on RLE; - 29 on LLE, -21 on RLE    Time 8    Period --   visits   Status Not Met                   Plan - 08/27/21 1632     Clinical Impression Statement Completed assesment patient's progress toward all LTG. Patient able to partially meet LTG #2 and LTG #3. Patient improved standing tolerance to >/= 3 mintues with use of standing frame. Patient continue to require intermittent Mod - Max A with tranfers utilizing sliding board at this time. reviewed HEP and how to cocmplete some exercises independently, but unfortuantely due to increased assistance with continue to require caregiver for stretching. Due to VL, will be discharging at this time. Patient agreeable.    Personal Factors and Comorbidities Comorbidity 2;Time since onset of injury/illness/exacerbation;Transportation    Comorbidities CP, Acid Reflux    Examination-Activity Limitations Bathing;Bed Mobility;Toileting;Transfers;Reach Overhead;Stand;Sit    Examination-Participation Restrictions Interpersonal Relationship    Stability/Clinical Decision Making Stable/Uncomplicated    Rehab Potential Fair    PT Frequency 1x / week    PT Duration 8 weeks    PT Treatment/Interventions ADLs/Self Care Home Management;Cryotherapy;Electrical Stimulation;Moist Heat;DME Instruction;Gait training;Functional mobility training;Therapeutic activities;Therapeutic exercise;Balance training;Neuromuscular re-education;Patient/family education;Wheelchair mobility training;Manual techniques;Vestibular;Joint Manipulations;Spinal Manipulations;Passive range of motion    Consulted and Agree with Plan of Care Patient             Patient will benefit from skilled therapeutic intervention in order to improve the  following deficits and impairments:  Pain, Postural dysfunction, Impaired tone, Decreased mobility, Decreased activity tolerance, Decreased endurance, Decreased range of motion, Decreased strength, Hypomobility, Impaired UE functional use, Impaired flexibility, Difficulty walking  Visit Diagnosis: Other abnormalities of gait and mobility  Muscle weakness (generalized)  Abnormal posture  Contracture of muscle, multiple sites     Problem List Patient Active Problem List   Diagnosis Date Noted   Dysphagia 05/31/2021   Epigastric pain 05/31/2021   Gastroesophageal reflux disease 05/31/2021   Hematemesis 05/31/2021   Hiatal hernia 05/31/2021   Microcytic anemia 05/31/2021   Sagittal band rupture, extensor tendon, nontraumatic, left 05/01/2021   Cerebral palsy ([place]) 04/25/2020   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

## 2021-09-04 ENCOUNTER — Encounter: Payer: Medicaid Other | Admitting: Family Medicine

## 2021-09-10 ENCOUNTER — Ambulatory Visit: Payer: Medicaid Other | Admitting: Podiatry

## 2021-12-02 ENCOUNTER — Ambulatory Visit: Payer: Medicaid Other | Admitting: Podiatry

## 2021-12-02 ENCOUNTER — Other Ambulatory Visit: Payer: Self-pay

## 2021-12-02 ENCOUNTER — Encounter: Payer: Self-pay | Admitting: Podiatry

## 2021-12-02 DIAGNOSIS — M79676 Pain in unspecified toe(s): Secondary | ICD-10-CM

## 2021-12-02 DIAGNOSIS — B351 Tinea unguium: Secondary | ICD-10-CM

## 2021-12-02 DIAGNOSIS — M79609 Pain in unspecified limb: Secondary | ICD-10-CM

## 2021-12-08 ENCOUNTER — Encounter: Payer: Self-pay | Admitting: Podiatry

## 2021-12-08 NOTE — Progress Notes (Signed)
Subjective: [name] is a 35 y.o. female patient seen today for follow up of  painful thick toenails that are difficult to trim. Pain interferes with ambulation. Aggravating factors include wearing enclosed shoe gear. Pain is relieved with periodic professional debridement.  New problems reported today: None.  PCP is Marva Panda, NP. Last visit was: 07/05/2021.  Allergies  Allergen Reactions   Lactose Diarrhea   Nickel Itching and Swelling    Objective: Physical Exam  General: Patient is a pleasant 35 y.o. African American female obese in NAD. AAO x 3.   Neurovascular Examination: Capillary refill time to digits immediate b/l. Palpable DP pulse(s) b/l LE. Palpable PT pulse(s) b/l LE. Pedal hair present. No pain with calf compression RLE. Dependent edema noted b/l LE. No cyanosis or clubbing noted b/l LE.  Protective sensation intact 5/5 intact bilaterally with 10g monofilament b/l. Proprioception intact bilaterally.  Dermatological:  Pedal integument with normal turgor, texture and tone BLE. No open wounds b/l LE. No interdigital macerations noted b/l LE. Toenails 1-5 b/l elongated, discolored, dystrophic, thickened, crumbly with subungual debris and tenderness to dorsal palpation. No hyperkeratotic nor porokeratotic lesions present on today's visit.  Musculoskeletal:  Noted disuse atrophy b/l lower extremities. No pain, crepitus or joint limitation noted with ROM bilateral LE. Utilizes motorized chair for mobility assistance.  Assessment: 1. Pain due to onychomycosis of nail    Plan: Patient was evaluated and treated and all questions answered. Consent given for treatment as described below: -No new findings. No new orders. -Mycotic toenails 1-5 bilaterally were debrided in length and girth with sterile nail nippers and dremel without incident. -Patient/POA to call should there be question/concern in the interim.  Return in about 3 months (around  03/02/2022).  Christine Gomez, DPM

## 2022-02-04 ENCOUNTER — Other Ambulatory Visit: Payer: Self-pay

## 2022-02-04 ENCOUNTER — Ambulatory Visit: Payer: Medicaid Other | Attending: *Deleted

## 2022-02-04 DIAGNOSIS — M6281 Muscle weakness (generalized): Secondary | ICD-10-CM | POA: Insufficient documentation

## 2022-02-04 DIAGNOSIS — M25642 Stiffness of left hand, not elsewhere classified: Secondary | ICD-10-CM | POA: Insufficient documentation

## 2022-02-04 DIAGNOSIS — R29818 Other symptoms and signs involving the nervous system: Secondary | ICD-10-CM | POA: Insufficient documentation

## 2022-02-04 DIAGNOSIS — G800 Spastic quadriplegic cerebral palsy: Secondary | ICD-10-CM | POA: Insufficient documentation

## 2022-02-04 DIAGNOSIS — M25632 Stiffness of left wrist, not elsewhere classified: Secondary | ICD-10-CM | POA: Insufficient documentation

## 2022-02-04 DIAGNOSIS — R293 Abnormal posture: Secondary | ICD-10-CM | POA: Insufficient documentation

## 2022-02-04 DIAGNOSIS — R2689 Other abnormalities of gait and mobility: Secondary | ICD-10-CM | POA: Insufficient documentation

## 2022-02-13 ENCOUNTER — Ambulatory Visit: Payer: Medicaid Other

## 2022-02-13 ENCOUNTER — Other Ambulatory Visit: Payer: Self-pay

## 2022-02-13 DIAGNOSIS — R293 Abnormal posture: Secondary | ICD-10-CM | POA: Diagnosis not present

## 2022-02-13 DIAGNOSIS — G800 Spastic quadriplegic cerebral palsy: Secondary | ICD-10-CM | POA: Diagnosis present

## 2022-02-13 DIAGNOSIS — M6281 Muscle weakness (generalized): Secondary | ICD-10-CM | POA: Diagnosis present

## 2022-02-13 DIAGNOSIS — R29818 Other symptoms and signs involving the nervous system: Secondary | ICD-10-CM | POA: Diagnosis present

## 2022-02-13 DIAGNOSIS — M25642 Stiffness of left hand, not elsewhere classified: Secondary | ICD-10-CM | POA: Diagnosis present

## 2022-02-13 DIAGNOSIS — R2689 Other abnormalities of gait and mobility: Secondary | ICD-10-CM | POA: Diagnosis present

## 2022-02-13 DIAGNOSIS — M25632 Stiffness of left wrist, not elsewhere classified: Secondary | ICD-10-CM | POA: Diagnosis present

## 2022-02-13 NOTE — Therapy (Signed)
[facility] [address] [place], [place], [address] Phone: [phone]   Fax:  [phone]  Physical Therapy Evaluation  Patient Details  Name: Christine Gomez MRN: [id number] Date of Birth: 06-13-1986 Referring Provider (PT): Marva Panda, NP   Encounter Date: 02/13/2022   PT End of Session - 02/13/22 1234     Visit Number 1    Number of Visits 9    Date for PT Re-Evaluation 04/11/22    Authorization Type Traditional MCD (VL: 27)    Authorization Time Period Awaiting Authorization for Initial Visits    PT Start Time 1230    PT Stop Time 1315    PT Time Calculation (min) 45 min    Equipment Utilized During Treatment Gait belt    Activity Tolerance Patient tolerated treatment well    Behavior During Therapy WFL for tasks assessed/performed             Past Medical History:  Diagnosis Date   Acid reflux    Cerebral palsy (HCC)     Past Surgical History:  Procedure Laterality Date   dorsal rhizotomy     back ligament looosening surgery   ESOPHAGOGASTRODUODENOSCOPY (EGD) WITH PROPOFOL N/A 10/08/2016   Procedure: ESOPHAGOGASTRODUODENOSCOPY (EGD) WITH PROPOFOL;  Surgeon: Willis Modena, MD;  Location: [facility];  Service: Endoscopy;  Laterality: N/A;   left arm surgery      There were no vitals filed for this visit.    Subjective Assessment - 02/13/22 1235     Subjective Patient reports that has been being more active in trying to improve the positioning of the legs. Still has a caregiver, but is not stretching her, only using for ADLs. Caregiver comes daily. Still using a freedom bridge lift in the bedroom, but would like to continue to work on sliding board. Patient still having some pain in the R Hip, reports that she does have increased pain in the R Hip when she isnt moving it or stretching it. No falls.    Pertinent History CP, Acid Reflux    Limitations Sitting;Standing;Walking;House hold  activities    Patient Stated Goals Improve ROM/mobility in Legs; Improve Standing Tolerance    Currently in Pain? No/denies                [facility] PT Assessment - 02/13/22 0001       Assessment   Medical Diagnosis Cerebral Palsy    Referring Provider (PT) Marva Panda, NP    Onset Date/Surgical Date 01/20/22    Hand Dominance Right    Prior Therapy Known to Clinic for prior PT/OT services      Precautions   Precautions Fall;Other (comment)    Precaution Comments B Hips Dislocated      Restrictions   Weight Bearing Restrictions No      Balance Screen   Has the patient fallen in the past 6 months No    Has the patient had a decrease in activity level because of a fear of falling?  No    Is the patient reluctant to leave their home because of a fear of falling?  No      Home Environment   Living Environment Private residence    Living Arrangements Non-relatives/Friends    Available Help at Discharge Other (Comment)   Aide; 3 hours Morning, 4 hours Evening daily   Type of Home Apartment    Home Layout One level    Home Equipment Hand held shower head;Wheelchair -  power;Tub bench;Other (comment)      Prior Function   Level of Independence Needs assistance with ADLs    Vocation Student;Self employed    Psychologist, occupational of Teaching laboratory technician, doing podcast. Completing online schooling    Leisure Travel, socializing with friends/ students      Cognition   Overall Cognitive Status Within Functional Limits for tasks assessed      Observation/Other Assessments   Focus on Therapeutic Outcomes (FOTO)  N/A      Sensation   Light Touch Appears Intact    Additional Comments for BLE's      Coordination   Gross Motor Movements are Fluid and Coordinated No    Coordination and Movement Description grossly uncoordinated due ROM/strength and spasticity      Posture/Postural Control   Posture/Postural Control Postural limitations    Postural Limitations Posterior  pelvic tilt;Flexed trunk;Weight shift right;Rounded Shoulders    Posture Comments bil LE's adducted in seated position in power w/c      Tone   Assessment Location Right Lower Extremity;Left Lower Extremity      ROM / Strength   AROM / PROM / Strength PROM;Strength      PROM   Overall PROM  Deficits    PROM Assessment Site Knee    Right/Left Knee Right;Left    Right Knee Extension -24    Right Knee Flexion 81   limited by pain   Left Knee Extension -31    Left Knee Flexion 89   limited by pain     Strength   Overall Strength Deficits    Overall Strength Comments 2-/5 for quad strength bilaterally, bilat ankle DF 2-/5      Flexibility   Hamstrings significant tightness noted bilaterally      Bed Mobility   Bed Mobility Sit to Supine;Sitting - Scoot to Edge of Bed;Supine to Sit    Supine to Sit Maximal Assistance - Patient - Patient 25-49%    Sitting - Scoot to Edge of Bed Moderate Assistance - Patient 50-74%    Sit to Supine Maximal Assistance - Patient 25-49%      Transfers   Transfers Lateral/Scoot Transfers    Lateral/Scoot Transfers With slide board;2: Max assist    Lateral/Scoot Transfer Details (indicate cue type and reason) completed lateral slide board transfer from power w/c <> mat, with PT providing cues for anterior weight shift/forward lean to faciliate clearance of hips, cues for hand placement, Max A required.      Balance   Balance Assessed Yes      Static Sitting Balance   Static Sitting - Balance Support No upper extremity supported    Static Sitting - Level of Assistance 4: Min assist    Static Sitting - Comment/# of Minutes 15 seconds without UE support , Min A for static seated balance intermittently.      RLE Tone   RLE Tone Moderate;Hypertonic      LLE Tone   LLE Tone Hypertonic;Severe                        Objective measurements completed on examination: See above findings.                PT Education - 02/13/22  1234     Education Details Educated on [employer]) Educated Patient    Methods Explanation    Comprehension Verbalized understanding  PT Short Term Goals - 02/13/22 1335       PT SHORT TERM GOAL #1   Title Pt will be able to verbalize understanding of techniques to improve proper positioning of BLE and stretching program with assistance (ALL STGs Due at 5th Visit)    Baseline no HEP established    Time 4    Period --   Visits   Status New      PT SHORT TERM GOAL #2   Title Patient will be able to stand in [place] for >/= 3 minute to demo improved activity and standing tolerance    Baseline TBA    Time 4    Period --   visits   Status New      PT SHORT TERM GOAL #3   Title Pt will be able to sit EOB without UE support for >/= 30 seconds to demo improved static seated balance    Baseline 15 seconds    Time 4    Period --   visits   Status New               PT Long Term Goals - 02/13/22 1338       PT LONG TERM GOAL #1   Title Pt will improve static seated balance without UE support to >/= 45 seconds to demo improved static seated tolerance for ADL's (ALL LTGs Due on 8th Visit)    Baseline 15 seconds    Time 8    Period --   visits   Status New      PT LONG TERM GOAL #2   Title Pt will be able to perform slideboard transfer with mod A for improved ability to participate in transfers and independence.    Baseline Max A    Time 8    Period --   visits   Status New      PT LONG TERM GOAL #3   Title Patient will demo ability to stand in Standing Frame for >/= 5 minutes for improved functional strength/ROM    Baseline TBA, 3 mins 45 seconds at prior POC    Time 8    Period Weeks    Status New      PT LONG TERM GOAL #4   Title Patient will improve Knee Extension PROM by 5 degress bilaterally    Baseline -31 on LLE, -24 on RLE    Time 8    Period --   visits   Status New                    Plan - 02/13/22 1322      Clinical Impression Statement Patient is a 36 y.o. female referred to Neuro OPPT services for Cerebral Palsy. PMH includes the following: CP, Acid Reflux. Upon evaluation, patietn presents with the following impairments: abnormal posture, abnormal tone and increased spasticity, impaired AROM/PROM, decreased functional strength, impaired functional mobility, and decreased activity and standing tolerance. Patient continues to use power w/c for primary means of mobility at this time. Pt able to complete sliding board transfer with Max A today, and Max A for bed mobility. Able to hold static seated balance without UE support for approx 30 seconds. Pt will benefit from skilled PT services to address impairments, maximize functional mobility, and improved static seated balance.    Personal Factors and Comorbidities Comorbidity 2;Time since onset of injury/illness/exacerbation;Transportation    Comorbidities CP, Acid Reflux    Examination-Activity Limitations Bathing;Bed Mobility;Toileting;Transfers;Reach Overhead;Stand;Sit  Examination-Participation Restrictions Interpersonal Relationship;Community Activity;Occupation;Meal Prep;School    Stability/Clinical Decision Making Stable/Uncomplicated    Clinical Decision Making Low    Rehab Potential Fair    PT Frequency 1x / week    PT Duration 8 weeks   plus eval   PT Treatment/Interventions ADLs/Self Care Home Management;Cryotherapy;Electrical Stimulation;Moist Heat;DME Instruction;Gait training;Functional mobility training;Therapeutic activities;Therapeutic exercise;Balance training;Neuromuscular re-education;Patient/family education;Wheelchair mobility training;Manual techniques;Vestibular;Joint Manipulations;Spinal Manipulations;Passive range of motion    PT Next Visit Plan continue transfer training; completed BLE stretching    Recommended Other Services Occupational Therapy    Consulted and Agree with Plan of Care Patient             Patient  will benefit from skilled therapeutic intervention in order to improve the following deficits and impairments:  Pain, Postural dysfunction, Impaired tone, Decreased mobility, Decreased activity tolerance, Decreased endurance, Decreased range of motion, Decreased strength, Hypomobility, Impaired UE functional use, Impaired flexibility, Difficulty walking, Decreased balance, Decreased knowledge of use of DME  Visit Diagnosis: Other abnormalities of gait and mobility  Abnormal posture  Muscle weakness (generalized)  Spastic quadriplegic cerebral palsy (HCC)     Problem List Patient Active Problem List   Diagnosis Date Noted   Dysphagia 05/31/2021   Epigastric pain 05/31/2021   Gastroesophageal reflux disease 05/31/2021   Hematemesis 05/31/2021   Hiatal hernia 05/31/2021   Microcytic anemia 05/31/2021   Sagittal band rupture, extensor tendon, nontraumatic, left 05/01/2021   Cerebral palsy (HCC) 04/25/2020   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 [place], [place], [address] Phone: [phone]   Fax:  [phone]  Name: Christine Gomez MRN: [id number] Date of Birth: 12-Sep-1986

## 2022-02-18 ENCOUNTER — Encounter: Payer: Self-pay | Admitting: Occupational Therapy

## 2022-02-18 ENCOUNTER — Ambulatory Visit: Payer: Medicaid Other | Admitting: Occupational Therapy

## 2022-02-18 ENCOUNTER — Ambulatory Visit: Payer: Medicaid Other

## 2022-02-18 ENCOUNTER — Other Ambulatory Visit: Payer: Self-pay

## 2022-02-18 DIAGNOSIS — M6281 Muscle weakness (generalized): Secondary | ICD-10-CM

## 2022-02-18 DIAGNOSIS — R29818 Other symptoms and signs involving the nervous system: Secondary | ICD-10-CM

## 2022-02-18 DIAGNOSIS — G800 Spastic quadriplegic cerebral palsy: Secondary | ICD-10-CM

## 2022-02-18 DIAGNOSIS — M25642 Stiffness of left hand, not elsewhere classified: Secondary | ICD-10-CM

## 2022-02-18 DIAGNOSIS — R293 Abnormal posture: Secondary | ICD-10-CM

## 2022-02-18 DIAGNOSIS — M25632 Stiffness of left wrist, not elsewhere classified: Secondary | ICD-10-CM

## 2022-02-18 DIAGNOSIS — R2689 Other abnormalities of gait and mobility: Secondary | ICD-10-CM

## 2022-02-18 NOTE — Therapy (Signed)
[facility] [address] [place], [place], [address] Phone: [phone]   Fax:  [phone]  Occupational Therapy Evaluation  Patient Details  Name: Christine Gomez MRN: [id number] Date of Birth: 03/02/1986 Referring Provider (OT): Everardo Beals, NP   Encounter Date: 02/18/2022   OT End of Session - 02/18/22 1633     Visit Number 1    Number of Visits 9    Date for OT Re-Evaluation 05/04/22    Authorization Type traditional medicaid    Progress Note Due on Visit 10    OT Start Time 1450    OT Stop Time 1530    OT Time Calculation (min) 40 min    Activity Tolerance Patient tolerated treatment well    Behavior During Therapy [facility] for tasks assessed/performed             Past Medical History:  Diagnosis Date   Acid reflux    Cerebral palsy ([place])     Past Surgical History:  Procedure Laterality Date   dorsal rhizotomy     back ligament looosening surgery   ESOPHAGOGASTRODUODENOSCOPY (EGD) WITH PROPOFOL N/A 10/08/2016   Procedure: ESOPHAGOGASTRODUODENOSCOPY (EGD) WITH PROPOFOL;  Surgeon: Arta Silence, MD;  Location: [facility];  Service: Endoscopy;  Laterality: N/A;   left arm surgery      There were no vitals filed for this visit.   Subjective Assessment - 02/18/22 1458     Subjective  I want to be able to get into my shower    Patient is accompanied by: Family member    Currently in Pain? Yes    Pain Score 2     Pain Location Back    Pain Orientation Posterior;Lower    Pain Descriptors / Indicators Aching    Pain Type Chronic pain    Pain Frequency Intermittent               [facility] OT Assessment - 02/18/22 0001       Assessment   Medical Diagnosis Cerebral Palsy    Referring Provider (OT) Everardo Beals, NP    Onset Date/Surgical Date 01/20/22    Hand Dominance Right    Prior Therapy Known to Clinic for prior PT/OT services      Precautions   Precautions Fall    Precaution  Comments B Hips Dislocated      Restrictions   Weight Bearing Restrictions No      Prior Function   Level of Independence Needs assistance with ADLs    Vocation Student;Self employed    Dance movement psychotherapist of shoe line, doing podcast. Completing online schooling, podcasts    Leisure Travel, socializing with friends/ students      ADL   Eating/Feeding Minimal assistance    Grooming Set up    Upper Body Bathing Moderate assistance    Lower Body Bathing Maximal assistance    Upper Body Dressing Moderate assistance    Lower Body Dressing +1 Total Photographer + 1 Total assistance    Toileting - Clothing Manipulation + 1 Total assistance    Toileting -  Hygiene + 1 Total assistance    Tub/Shower Transfer + 1 Total assistance    ADL comments Patient eager for improved functional ability via compensation and remedication as possible      Written Expression   Dominant Hand Right      Cognition   Overall Cognitive Status Within Functional Limits for tasks assessed  Observation/Other Assessments   Focus on Therapeutic Outcomes (FOTO)  N/A      Posture/Postural Control   Posture/Postural Control Postural limitations    Postural Limitations Posterior pelvic tilt;Flexed trunk;Weight shift right;Rounded Shoulders      Sensation   Light Touch Appears Intact    Additional Comments distal UE      Coordination   Gross Motor Movements are Fluid and Coordinated No    Fine Motor Movements are Fluid and Coordinated No    Box and Blocks R:  20  / L:  2      Perception   Perception Within Functional Limits      Praxis   Praxis Intact      Tone   Assessment Location Left Upper Extremity      ROM / Strength   AROM / PROM / Strength AROM      AROM   Overall AROM  Deficits    Overall AROM Comments L more impaired than right - shoulders and elbows    AROM Assessment Site Shoulder;Elbow    Right/Left Shoulder Right;Left    Right Shoulder Flexion  115 Degrees    Right Shoulder ABduction 80 Degrees    Left Shoulder Flexion 75 Degrees    Left Shoulder ABduction 35 Degrees    Right/Left Elbow Right;Left    Right Elbow Extension 140    Left Elbow Extension 125      Hand Function   Right Hand Gross Grasp Functional    Right Hand Grip (lbs) 37    Left Hand Gross Grasp Impaired    Left Hand Grip (lbs) 9.4      RLE Tone   RLE Tone Moderate;Hypertonic                              OT Education - 02/18/22 1633     Education Details Reviewed potential goals and plan of care for OT    Person(s) Educated Patient;Parent(s)    Methods Explanation    Comprehension Verbalized understanding              OT Short Term Goals - 02/18/22 1642       OT SHORT TERM GOAL #1   Title Patient will complete an HEP designed to improve range of motion in Left hand/elbow    Baseline No current HEP    Time 4    Period Weeks    Status New    Target Date 04/04/22      OT SHORT TERM GOAL #2   Title Patient will demonstrate ability to don/doff updated left hand splint and demo understanding of wearing schedule    Baseline does not have current hand splint s/p tendon transfer    Time 4    Period Weeks    Status New      OT SHORT TERM GOAL #3   Title Patient will demonstrate ability to assist with pulling up/down pants over hips after tolieting or with daily dressing/undressing    Baseline Dependent    Time 4    Period Weeks    Status New      OT SHORT TERM GOAL #4   Title Patient will explore rolling shower chair options to use with her lift system that will improve safety with shower transfers    Baseline caregivers lifting onto shower seat    Time 4    Period Weeks    Status New  OT Long Term Goals - 02/18/22 1647       OT LONG TERM GOAL #1   Title Patient will complete updated HEP to address grasp and pinch ability in LUE    Baseline No current HEP    Time 8    Period Weeks    Status  New    Target Date 05/04/22      OT LONG TERM GOAL #2   Title Patient will complete at least 8 box on box and blocks test (LUE)    Baseline 2 blocks    Time 8    Period Weeks    Status New      OT LONG TERM GOAL #3   Title Patient will roll left/right to assist with donning of incontinence brief, or explore pull up type brief    Baseline dependent    Time 8    Period Weeks    Status New      OT LONG TERM GOAL #4   Title Patient will unweight hips with min assist to aide with toilet hygiene    Baseline dependent    Time 8    Period Weeks    Status New      OT LONG TERM GOAL #5   Title Patient will feed self finger foods with left hand after set up    Baseline Not using left hand for feeding    Time 8    Period Weeks    Status New                   Plan - 02/18/22 1635     Clinical Impression Statement Patient is a 36 yr old woman 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  Patient uses power wheelchair for her mobility, and has a caregiver to assist with ADL 7 days/week.  Patient has had tendon transfer surgery to left forearm/hand to increase range of motion and potential functional use in 03/2021.  Patient reports hand is tightening up, she had botox injection to left arm (proximal) 2 months ago.  Patient presents to OT evaluation very directed in terms of goals she hopes to achieve.  Patient presents with spasticity in left UE, and limited range of motion in BUE L>R in shoulders and elbows.  Patient may benefit from left handed splint to promote increased motion.  Patient will benefit from skilled OT intervention to increase autonomy with basic self care skills.    OT Occupational Profile and History Problem Focused Assessment - Including review of records relating to presenting problem    Occupational performance deficits (Please refer to evaluation for details): ADL's;IADL's    Body Structure / Function / Physical Skills  ADL;Coordination;Endurance;GMC;UE functional use;Balance;Fascial restriction;Skin integrity;Pain;IADL;Flexibility;Decreased knowledge of use of DME;Body mechanics;Dexterity;FMC;Strength;Tone;ROM;Mobility;Edema    Rehab Potential Good    Clinical Decision Making Several treatment options, min-mod task modification necessary    Comorbidities Affecting Occupational Performance: May have comorbidities impacting occupational performance    Modification or Assistance to Complete Evaluation  No modification of tasks or assist necessary to complete eval    OT Frequency 1x / week    OT Duration 8 weeks    OT Treatment/Interventions Self-care/ADL training;Electrical Stimulation;Therapeutic exercise;Aquatic Therapy;Moist Heat;Neuromuscular education;Splinting;Patient/family education;Balance training;Therapeutic activities;Functional Mobility Training;Fluidtherapy;Ultrasound;Contrast Bath;Cryotherapy;DME and/or AE instruction;Manual Therapy;Passive range of motion    Plan consider hand splint for LUE, Problem solve LB dressing equipment, positioning, including brief    Consulted and Agree with Plan of  Care Patient             Patient will benefit from skilled therapeutic intervention in order to improve the following deficits and impairments:   Body Structure / Function / Physical Skills: ADL, Coordination, Endurance, GMC, UE functional use, Balance, Fascial restriction, Skin integrity, Pain, IADL, Flexibility, Decreased knowledge of use of DME, Body mechanics, Dexterity, FMC, Strength, Tone, ROM, Mobility, Edema       Visit Diagnosis: Abnormal posture - Plan: Ot plan of care cert/re-cert  Muscle weakness (generalized) - Plan: Ot plan of care cert/re-cert  Spastic quadriplegic cerebral palsy ([place]) - Plan: Ot plan of care cert/re-cert  Other symptoms and signs involving the nervous system - Plan: Ot plan of care cert/re-cert  Stiffness of left hand, not elsewhere classified - Plan: Ot plan of  care cert/re-cert  Stiffness of left wrist, not elsewhere classified - Plan: Ot plan of care cert/re-cert    Problem List Patient Active Problem List   Diagnosis Date Noted   Dysphagia 05/31/2021   Epigastric pain 05/31/2021   Gastroesophageal reflux disease 05/31/2021   Hematemesis 05/31/2021   Hiatal hernia 05/31/2021   Microcytic anemia 05/31/2021   Sagittal band rupture, extensor tendon, nontraumatic, left 05/01/2021   Cerebral palsy ([place]) 04/25/2020   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 [place], [place], [address] Phone: [phone]   Fax:  [phone]  Name: Christine Gomez MRN: [id number] Date of Birth: July 22, 1986

## 2022-02-18 NOTE — Therapy (Signed)
[facility] [address] [place], [place], [address] Phone: [phone]   Fax:  [phone]  Physical Therapy Treatment  Patient Details  Name: Christine Gomez MRN: [id number] Date of Birth: 09-07-1986 Referring Provider (PT): Marva Panda, NP   Encounter Date: 02/18/2022   PT End of Session - 02/18/22 1527     Visit Number 2    Number of Visits 9    Date for PT Re-Evaluation 04/11/22    Authorization Type Traditional MCD (VL: 27)    Authorization Time Period Approved for Initial 3 Visits from  02/18/22 - 03/10/22    Authorization - Visit Number 1    Authorization - Number of Visits 3    PT Start Time 1534   finishing up with Occupational Therapy eval   PT Stop Time 1615    PT Time Calculation (min) 41 min    Equipment Utilized During Treatment Gait belt    Activity Tolerance Patient tolerated treatment well    Behavior During Therapy WFL for tasks assessed/performed             Past Medical History:  Diagnosis Date   Acid reflux    Cerebral palsy (HCC)     Past Surgical History:  Procedure Laterality Date   dorsal rhizotomy     back ligament looosening surgery   ESOPHAGOGASTRODUODENOSCOPY (EGD) WITH PROPOFOL N/A 10/08/2016   Procedure: ESOPHAGOGASTRODUODENOSCOPY (EGD) WITH PROPOFOL;  Surgeon: Willis Modena, MD;  Location: [facility];  Service: Endoscopy;  Laterality: N/A;   left arm surgery      There were no vitals filed for this visit.   Subjective Assessment - 02/18/22 1535     Subjective Patient reports no new changes/complaints. Podcast is out. Had OT evaluation today, also going to be seen for 8 weeks. Some mild pain in the back.    Pertinent History CP, Acid Reflux    Limitations Sitting;Standing;Walking;House hold activities    Patient Stated Goals Improve ROM/mobility in Legs; Improve Standing Tolerance    Currently in Pain? Yes    Pain Score 2     Pain Location Back    Pain  Orientation Posterior;Lower    Pain Descriptors / Indicators Aching    Pain Type Chronic pain                               [facility] Adult PT Treatment/Exercise - 02/18/22 0001       Bed Mobility   Bed Mobility Sit to Supine;Sitting - Scoot to Edge of Bed;Left Sidelying to Sit    Left Sidelying to Sit Moderate Assistance - Patient 50-74%   pt able to get legs off with increased time required, able to intiate to sitting position but require increased Mod A to return to upright.   Supine to Sit Maximal Assistance - Patient - Patient 25-49%    Sitting - Scoot to Edge of Bed Moderate Assistance - Patient 50-74%    Sit to Supine Maximal Assistance - Patient 25-49%      Transfers   Transfers Lateral/Scoot Transfers    Lateral/Scoot Transfers With slide board;2: Max assist    Lateral/Scoot Transfer Details (indicate cue type and reason) lateral slide board transfer completed from power w/c <> mat, PT providing Max A. intermittent cues and faciliation for improved forward lean and proper hand positioning. Pt able to complete lateral weight shift to offload hip to assis lean for placement of  slide board, but PT have to position board correctly. Trialed lateral scooting without feet placed on foot rest, increased challenge with assistance required due to seated balance. Will continue to practice this in future sessions      Exercises   Exercises Other Exercises    Other Exercises  With patient supine, PT completed manual stretching to BLE. PT completed manual B gastroc stretch 3 x 30 seconds progressing to patient's tolerance,. Completed B Hamstring stretch 3 x 30 seconds, prgoressing into range. Then completed bilateral hip adduction stretch to patient's tolerance, 3 x 30 seconds. Increased spasms noted in LLE.Transitioned to hookyling position, completed glute set with initating bridge x 10 reps with PT providing faciliation at pelvis. Unable to clear hips, but able to initiate  movements.                       PT Short Term Goals - 02/13/22 1335       PT SHORT TERM GOAL #1   Title Pt will be able to verbalize understanding of techniques to improve proper positioning of BLE and stretching program with assistance (ALL STGs Due at 5th Visit)    Baseline no HEP established    Time 4    Period --   Visits   Status New      PT SHORT TERM GOAL #2   Title Patient will be able to stand in [place] for >/= 3 minute to demo improved activity and standing tolerance    Baseline TBA    Time 4    Period --   visits   Status New      PT SHORT TERM GOAL #3   Title Pt will be able to sit EOB without UE support for >/= 30 seconds to demo improved static seated balance    Baseline 15 seconds    Time 4    Period --   visits   Status New               PT Long Term Goals - 02/13/22 1338       PT LONG TERM GOAL #1   Title Pt will improve static seated balance without UE support to >/= 45 seconds to demo improved static seated tolerance for ADL's (ALL LTGs Due on 8th Visit)    Baseline 15 seconds    Time 8    Period --   visits   Status New      PT LONG TERM GOAL #2   Title Pt will be able to perform slideboard transfer with mod A for improved ability to participate in transfers and independence.    Baseline Max A    Time 8    Period --   visits   Status New      PT LONG TERM GOAL #3   Title Patient will demo ability to stand in Standing Frame for >/= 5 minutes for improved functional strength/ROM    Baseline TBA, 3 mins 45 seconds at prior POC    Time 8    Period Weeks    Status New      PT LONG TERM GOAL #4   Title Patient will improve Knee Extension PROM by 5 degress bilaterally    Baseline -31 on LLE, -24 on RLE    Time 8    Period --   visits   Status New  Plan - 02/18/22 1527     Clinical Impression Statement Continued use of lateral slide board from power w/c <> mat, continue to require Max A for  transfer at this time, cues for forward lean and head/hip positioning. Rest of session focused on strengthening and streching with patient supine, patient tolerating well. Will continue per POC.    Personal Factors and Comorbidities Comorbidity 2;Time since onset of injury/illness/exacerbation;Transportation    Comorbidities CP, Acid Reflux    Examination-Activity Limitations Bathing;Bed Mobility;Toileting;Transfers;Reach Overhead;Stand;Sit    Examination-Participation Restrictions Interpersonal Relationship;Community Activity;Occupation;Meal Prep;School    Stability/Clinical Decision Making Stable/Uncomplicated    Rehab Potential Fair    PT Frequency 1x / week    PT Duration 8 weeks   plus eval   PT Treatment/Interventions ADLs/Self Care Home Management;Cryotherapy;Electrical Stimulation;Moist Heat;DME Instruction;Gait training;Functional mobility training;Therapeutic activities;Therapeutic exercise;Balance training;Neuromuscular re-education;Patient/family education;Wheelchair mobility training;Manual techniques;Vestibular;Joint Manipulations;Spinal Manipulations;Passive range of motion    PT Next Visit Plan continue transfer training; BLE stretching; standing tolerance in standing frame    Consulted and Agree with Plan of Care Patient             Patient will benefit from skilled therapeutic intervention in order to improve the following deficits and impairments:  Pain, Postural dysfunction, Impaired tone, Decreased mobility, Decreased activity tolerance, Decreased endurance, Decreased range of motion, Decreased strength, Hypomobility, Impaired UE functional use, Impaired flexibility, Difficulty walking, Decreased balance, Decreased knowledge of use of DME  Visit Diagnosis: Other abnormalities of gait and mobility  Abnormal posture  Muscle weakness (generalized)  Spastic quadriplegic cerebral palsy [facility])     Problem List Patient Active Problem List   Diagnosis Date Noted    Dysphagia 05/31/2021   Epigastric pain 05/31/2021   Gastroesophageal reflux disease 05/31/2021   Hematemesis 05/31/2021   Hiatal hernia 05/31/2021   Microcytic anemia 05/31/2021   Sagittal band rupture, extensor tendon, nontraumatic, left 05/01/2021   Cerebral palsy (HCC) 04/25/2020   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

## 2022-02-24 ENCOUNTER — Ambulatory Visit: Payer: Medicaid Other

## 2022-02-24 ENCOUNTER — Other Ambulatory Visit: Payer: Self-pay

## 2022-02-24 DIAGNOSIS — R293 Abnormal posture: Secondary | ICD-10-CM | POA: Diagnosis not present

## 2022-02-24 DIAGNOSIS — M6281 Muscle weakness (generalized): Secondary | ICD-10-CM

## 2022-02-24 DIAGNOSIS — G800 Spastic quadriplegic cerebral palsy: Secondary | ICD-10-CM

## 2022-02-24 DIAGNOSIS — R2689 Other abnormalities of gait and mobility: Secondary | ICD-10-CM

## 2022-02-24 NOTE — Therapy (Signed)
[facility] [address] [place], [place], [address] Phone: [phone]   Fax:  [phone]  Physical Therapy Treatment  Patient Details  Name: Christine Gomez MRN: [id number] Date of Birth: March 20, 1986 Referring Provider (PT): Marva Panda, NP   Encounter Date: 02/24/2022   PT End of Session - 02/24/22 1014     Visit Number 3    Number of Visits 9    Date for PT Re-Evaluation 04/11/22    Authorization Type Traditional MCD (VL: 27)    Authorization Time Period Approved for Initial 3 Visits from  02/18/22 - 03/10/22    Authorization - Visit Number 2    Authorization - Number of Visits 3    PT Start Time 1015    PT Stop Time 1100    PT Time Calculation (min) 45 min    Equipment Utilized During Treatment Gait belt    Activity Tolerance Patient tolerated treatment well    Behavior During Therapy WFL for tasks assessed/performed             Past Medical History:  Diagnosis Date   Acid reflux    Cerebral palsy (HCC)     Past Surgical History:  Procedure Laterality Date   dorsal rhizotomy     back ligament looosening surgery   ESOPHAGOGASTRODUODENOSCOPY (EGD) WITH PROPOFOL N/A 10/08/2016   Procedure: ESOPHAGOGASTRODUODENOSCOPY (EGD) WITH PROPOFOL;  Surgeon: Willis Modena, MD;  Location: [facility];  Service: Endoscopy;  Laterality: N/A;   left arm surgery      There were no vitals filed for this visit.   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 No other new changes. No pain, reports just fatigue.    Pertinent History CP, Acid Reflux    Limitations Sitting;Standing;Walking;House hold activities    Patient Stated Goals Improve ROM/mobility in Legs; Improve Standing Tolerance    Currently in Pain? No/denies   no pain; reports just fatigue.              [facility] Adult PT  Treatment/Exercise - 02/24/22 0001       Transfers   Sit to Stand 2: Max assist;Other (comment)    Sit to Stand Details Tactile cues for placement;Tactile cues for weight shifting;Manual facilitation for weight shifting;Manual facilitation for placement;Manual facilitation for weight bearing    Sit to Stand Details (indicate cue type and reason) with use of standing frame today    Stand to Sit 2: Max assist    Stand to Sit Details (indicate cue type and reason) Tactile cues for sequencing;Tactile cues for weight shifting;Manual facilitation for weight shifting;Tactile cues for posture    Stand to Sit Details with use of standing frame today      Therapeutic Activites    Therapeutic Activities Other Therapeutic Activities    Other Therapeutic Activities Completed standing tolerance in standing frame multiple trials throughout today's PT session. PT providing assistance and cues for upright posture/positioning intiially. Able to stand initially for 3 mins, 20 seconds before require seated rest break. Second trial able to stand for 3 minutes, 40 seconds with addition of glute sets and abdominal activiation to work on improved upright posture and hip extension in standing. Third Trial able to stand for 5 minutes with addition of reaching with BUE, alternating x 5 reps. More challenge noted with RUE > LUE. Intermittent rest breaks required.  PT Short Term Goals - 02/13/22 1335       PT SHORT TERM GOAL #1   Title Pt will be able to verbalize understanding of techniques to improve proper positioning of BLE and stretching program with assistance (ALL STGs Due at 5th Visit)    Baseline no HEP established    Time 4    Period --   Visits   Status New      PT SHORT TERM GOAL #2   Title Patient will be able to stand in [place] for >/= 3 minute to demo improved activity and standing tolerance    Baseline TBA    Time 4    Period --   visits   Status New      PT SHORT TERM GOAL #3    Title Pt will be able to sit EOB without UE support for >/= 30 seconds to demo improved static seated balance    Baseline 15 seconds    Time 4    Period --   visits   Status New               PT Long Term Goals - 02/13/22 1338       PT LONG TERM GOAL #1   Title Pt will improve static seated balance without UE support to >/= 45 seconds to demo improved static seated tolerance for ADL's (ALL LTGs Due on 8th Visit)    Baseline 15 seconds    Time 8    Period --   visits   Status New      PT LONG TERM GOAL #2   Title Pt will be able to perform slideboard transfer with mod A for improved ability to participate in transfers and independence.    Baseline Max A    Time 8    Period --   visits   Status New      PT LONG TERM GOAL #3   Title Patient will demo ability to stand in Standing Frame for >/= 5 minutes for improved functional strength/ROM    Baseline TBA, 3 mins 45 seconds at prior POC    Time 8    Period Weeks    Status New      PT LONG TERM GOAL #4   Title Patient will improve Knee Extension PROM by 5 degress bilaterally    Baseline -31 on LLE, -24 on RLE    Time 8    Period --   visits   Status New                   Plan - 02/24/22 1106     Clinical Impression Statement Trialed Standing Frame today during session, patient able to stand 3 consecutive times ranging from 3 mins to 5 mins, intermittent rest breaks required due to fatigue. Incorporated some standing strengthening and weight shift activities to promote improved posture and upright positioning. Will continue per POC.    Personal Factors and Comorbidities Comorbidity 2;Time since onset of injury/illness/exacerbation;Transportation    Comorbidities CP, Acid Reflux    Examination-Activity Limitations Bathing;Bed Mobility;Toileting;Transfers;Reach Overhead;Stand;Sit    Examination-Participation Restrictions Interpersonal Relationship;Community Activity;Occupation;Meal Prep;School     Stability/Clinical Decision Making Stable/Uncomplicated    Rehab Potential Fair    PT Frequency 1x / week    PT Duration 8 weeks   plus eval   PT Treatment/Interventions ADLs/Self Care Home Management;Cryotherapy;Electrical Stimulation;Moist Heat;DME Instruction;Gait training;Functional mobility training;Therapeutic activities;Therapeutic exercise;Balance training;Neuromuscular re-education;Patient/family education;Wheelchair mobility training;Manual techniques;Vestibular;Joint Manipulations;Spinal Manipulations;Passive range  of motion    PT Next Visit Plan continue transfer training; BLE stretching; standing tolerance in standing frame    Consulted and Agree with Plan of Care Patient             Patient will benefit from skilled therapeutic intervention in order to improve the following deficits and impairments:  Pain, Postural dysfunction, Impaired tone, Decreased mobility, Decreased activity tolerance, Decreased endurance, Decreased range of motion, Decreased strength, Hypomobility, Impaired UE functional use, Impaired flexibility, Difficulty walking, Decreased balance, Decreased knowledge of use of DME  Visit Diagnosis: Abnormal posture  Muscle weakness (generalized)  Spastic quadriplegic cerebral palsy (HCC)  Other abnormalities of gait and mobility     Problem List Patient Active Problem List   Diagnosis Date Noted   Dysphagia 05/31/2021   Epigastric pain 05/31/2021   Gastroesophageal reflux disease 05/31/2021   Hematemesis 05/31/2021   Hiatal hernia 05/31/2021   Microcytic anemia 05/31/2021   Sagittal band rupture, extensor tendon, nontraumatic, left 05/01/2021   Cerebral palsy (HCC) 04/25/2020   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 [place], [place], [address] Phone: [phone]   Fax:  [phone]  Name: MELODIE ASHWORTH MRN: [id number] Date of Birth: [birth year]

## 2022-03-04 ENCOUNTER — Other Ambulatory Visit: Payer: Self-pay

## 2022-03-04 ENCOUNTER — Encounter: Payer: Self-pay | Admitting: Occupational Therapy

## 2022-03-04 ENCOUNTER — Ambulatory Visit: Payer: Medicaid Other | Attending: *Deleted

## 2022-03-04 ENCOUNTER — Ambulatory Visit: Payer: Medicaid Other | Admitting: Occupational Therapy

## 2022-03-04 DIAGNOSIS — R29818 Other symptoms and signs involving the nervous system: Secondary | ICD-10-CM | POA: Diagnosis present

## 2022-03-04 DIAGNOSIS — G800 Spastic quadriplegic cerebral palsy: Secondary | ICD-10-CM | POA: Insufficient documentation

## 2022-03-04 DIAGNOSIS — M6281 Muscle weakness (generalized): Secondary | ICD-10-CM | POA: Insufficient documentation

## 2022-03-04 DIAGNOSIS — M25642 Stiffness of left hand, not elsewhere classified: Secondary | ICD-10-CM

## 2022-03-04 DIAGNOSIS — R293 Abnormal posture: Secondary | ICD-10-CM | POA: Insufficient documentation

## 2022-03-04 DIAGNOSIS — M25632 Stiffness of left wrist, not elsewhere classified: Secondary | ICD-10-CM

## 2022-03-04 DIAGNOSIS — R2689 Other abnormalities of gait and mobility: Secondary | ICD-10-CM | POA: Diagnosis present

## 2022-03-04 NOTE — Therapy (Signed)
?OUTPATIENT PHYSICAL THERAPY TREATMENT NOTE ? ? ?Patient Name: Christine Gomez ?MRN: [id number] ?DOB:[birth year], 36 y.o., female ?Today's Date: 03/04/2022 ? ?PCP: Marva Panda, NP ?REFERRING PROVIDER: Marva Panda, NP ? ? PT End of Session - 03/04/22 1444   ? ? Visit Number 4   ? Number of Visits 9   ? Date for PT Re-Evaluation 04/11/22   ? Authorization Type Traditional MCD (VL: 27)   ? Authorization Time Period Approved for Initial 3 Visits from  02/18/22 - 03/10/22; awaiting additional authorization   ? Authorization - Visit Number 3   ? Authorization - Number of Visits 3   ? PT Start Time 1445   ? PT Stop Time 1530   ? PT Time Calculation (min) 45 min   ? Equipment Utilized During Treatment Gait belt   ? Activity Tolerance Patient tolerated treatment well   ? Behavior During Therapy [facility] for tasks assessed/performed   ? ?  ?  ? ?  ? ? ?Past Medical History:  ?Diagnosis Date  ? Acid reflux   ? Cerebral palsy (HCC)   ? ?Past Surgical History:  ?Procedure Laterality Date  ? dorsal rhizotomy    ? back ligament looosening surgery  ? ESOPHAGOGASTRODUODENOSCOPY (EGD) WITH PROPOFOL N/A 10/08/2016  ? Procedure: ESOPHAGOGASTRODUODENOSCOPY (EGD) WITH PROPOFOL;  Surgeon: Willis Modena, MD;  Location: [facility];  Service: Endoscopy;  Laterality: N/A;  ? left arm surgery    ? ?Patient Active Problem List  ? Diagnosis Date Noted  ? Dysphagia 05/31/2021  ? Epigastric pain 05/31/2021  ? Gastroesophageal reflux disease 05/31/2021  ? Hematemesis 05/31/2021  ? Hiatal hernia 05/31/2021  ? Microcytic anemia 05/31/2021  ? Sagittal band rupture, extensor tendon, nontraumatic, left 05/01/2021  ? Cerebral palsy (HCC) 04/25/2020  ? 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 Other acquired deformity of other parts of limb 04/25/2020  ? 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 Unspecified deformity of forearm, excluding fingers 04/25/2020  ? Urinary  incontinence 04/25/2020  ? Iron deficiency anemia due to chronic blood loss 07/10/2017  ? ? ?REFERRING DIAG: Cerebral Palsy  ? ?THERAPY DIAG:  ?Abnormal posture ? ?Muscle weakness (generalized) ? ?Spastic quadriplegic cerebral palsy (HCC) ? ?Other abnormalities of gait and mobility ? ?PERTINENT HISTORY: CP, Acid Reflux  ? ?PRECAUTIONS: Fall, B Hip Dislocation ? ?SUBJECTIVE: Patient reports being sleepy, but overall doing well.  ? ?PAIN:  ?Are you having pain? Yes ?NPRS scale: 2/10 ?Pain location: Back ?Pain orientation: Lower  ?PAIN TYPE: aching ?Pain description: intermittent ? ? ? ?TODAY'S TREATMENT:  ? [facility] Adult PT Treatment/Exercise - 03/04/22 0001   ? ?  ? Transfers  ? Transfers Lateral/Scoot Transfers   ? Lateral/Scoot Transfers With slide board;3: Mod assist;2: Max assist   ? Print production planner Details (indicate cue type and reason) lateral slide board transfer completed from power w/c <> mat, PT providing intermittent Mod - Max A. intermittent cues and faciliation for improved forward lean and proper hand positioning. Pt able to complete lateral weight shift to offload hip to assis lean for placement of slide board, but PT have to position board correctly. Continue to demo improvements with feet placed on foot rest.   ?  ? Self-Care  ? Self-Care Other Self-Care Comments   ? Other Self-Care Comments  Reviewed continued stretching and positioning activities, patietn verbalizing understanding. PT educating on options for core exercise at home, but encouraged caregiver presence for safety.   ?  ?  Neuro Re-ed   ? Neuro Re-ed Details  seated on mat with wedge + 2 pillows completd core activation working on coming from elevated position to upright x 6 reps. Then completed lateral core activation/oblique with lateral flexion onto RUE on pebble x 8 reps bilaterally then working on coming into upright position, cues for upright posture and faciliation. Seated without UE support and holding dowel with BUE support  completed core activity with Pt hitting ball tossed to her by PT, supervision. Completed 2 x 10 reps with patietn maintaining seated balance with activity.   ? ?  ?  ? ?  ? ? ? ? ?PATIENT EDUCATION: ?Education details: Progress toward STGs ?Person educated: Patient ?Education method: Explanation ?Education comprehension: verbalized understanding ? ? ?HOME EXERCISE PROGRAM: ?Access Code: [email or web address] ? ? PT Short Term Goals - 02/13/22 1335   ? ?  ? PT SHORT TERM GOAL #1  ? Title Pt will be able to verbalize understanding of techniques to improve proper positioning of BLE and stretching program with assistance (ALL STGs Due at 5th Visit)   ? Baseline no HEP established; verbalize understanding   ? Time 4   ? Period --   Visits  ? Status Achieved  ?  ? PT SHORT TERM GOAL #2  ? Title Patient will be able to stand in [place] for >/= 3 minute to demo improved activity and standing tolerance   ? Baseline TBA; 3 mins, 40 seconds on 2/27  ? Time 4   ? Period --   visits  ? Status Achieved  ?  ? PT SHORT TERM GOAL #3  ? Title Pt will be able to sit EOB without UE support for >/= 30 seconds to demo improved static seated balance   ? Baseline 15 seconds; able to complete without UE support 1-2 minute, approx 30-45 seconds with dynamic balance.  ? Time 4   ? Period --   visits  ? Status Achieved  ? ?  ?  ? ?  ? ? ? PT Long Term Goals - 02/13/22 1338   ? ?  ? PT LONG TERM GOAL #1  ? Title Pt will improve static seated balance without UE support to >/= 45 seconds to demo improved static seated tolerance for ADL's (ALL LTGs Due on 8th Visit)   ? Baseline 15 seconds   ? Time 8   ? Period --   visits  ? Status New   ?  ? PT LONG TERM GOAL #2  ? Title Pt will be able to perform slideboard transfer with mod A for improved ability to participate in transfers and independence.   ? Baseline Max A   ? Time 8   ? Period --   visits  ? Status New   ?  ? PT LONG TERM GOAL #3  ? Title Patient will demo ability to stand in Standing Frame for >/= 5  minutes for improved functional strength/ROM   ? Baseline TBA, 3 mins 45 seconds at prior POC   ? Time 8   ? Period Weeks   ? Status New   ?  ? PT LONG TERM GOAL #4  ? Title Patient will improve Knee Extension PROM by 5 degress bilaterally   ? Baseline -31 on LLE, -24 on RLE   ? Time 8   ? Period --   visits  ? Status New   ? ?  ?  ? ?  ? ? ? Plan - 03/04/22 1539   ? ?  Clinical Impression Statement Completed assesment of patient's progress toward STGs. Patient able to meet all STGs at this time demonstrating improved standing tolernace, improved core stability with static and dynamic seated balance. Continued rest of session focused on activities working toward improved lateral weight shift, and dynamic core stability. Intermittent rest breaks required. Will continue per POC.   ? Personal Factors and Comorbidities Comorbidity 2;Time since onset of injury/illness/exacerbation;Transportation   ? Comorbidities CP, Acid Reflux   ? Examination-Activity Limitations Bathing;Bed Mobility;Toileting;Transfers;Reach Overhead;Stand;Sit   ? Examination-Participation Restrictions Interpersonal Relationship;Community Activity;Occupation;Meal Prep;School   ? Stability/Clinical Decision Making Stable/Uncomplicated   ? Rehab Potential Fair   ? PT Frequency 1x / week   ? PT Duration 8 weeks   plus eval  ? PT Treatment/Interventions ADLs/Self Care Home Management;Cryotherapy;Electrical Stimulation;Moist Heat;DME Instruction;Gait training;Functional mobility training;Therapeutic activities;Therapeutic exercise;Balance training;Neuromuscular re-education;Patient/family education;Wheelchair mobility training;Manual techniques;Vestibular;Joint Manipulations;Spinal Manipulations;Passive range of motion   ? PT Next Visit Plan did we get approval for additional visits? continue transfer training; BLE stretching; standing tolerance in standing frame   ? Consulted and Agree with Plan of Care Patient   ? ?  ?  ? ?  ? ? ? ? ?Tempie Donning, PT,  DPT ?03/04/2022, 3:41 PM ? ?  ? ?

## 2022-03-04 NOTE — Therapy (Signed)
[facility] [address] [place], [place], [address] Phone: [phone]   Fax:  [phone]  Occupational Therapy Treatment  Patient Details  Name: Christine Gomez MRN: [id number] Date of Birth: [birth year] Referring Provider (OT): Marva Panda, NP   Encounter Date: 03/04/2022   OT End of Session - 03/04/22 1732     Visit Number 2    Number of Visits 9    Date for OT Re-Evaluation 05/04/22    Authorization Type traditional medicaid    Progress Note Due on Visit 10    OT Start Time 1530    OT Stop Time 1615    OT Time Calculation (min) 45 min             Past Medical History:  Diagnosis Date   Acid reflux    Cerebral palsy (HCC)     Past Surgical History:  Procedure Laterality Date   dorsal rhizotomy     back ligament looosening surgery   ESOPHAGOGASTRODUODENOSCOPY (EGD) WITH PROPOFOL N/A 10/08/2016   Procedure: ESOPHAGOGASTRODUODENOSCOPY (EGD) WITH PROPOFOL;  Surgeon: Willis Modena, MD;  Location: [facility];  Service: Endoscopy;  Laterality: N/A;   left arm surgery      There were no vitals filed for this visit.   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 Yes    Pain Score 2     Pain Location Back    Pain Orientation Posterior;Lower    Pain Descriptors / Indicators Aching    Pain Type Chronic pain    Pain Onset More than a month ago    Pain Frequency Intermittent                          OT Treatments/Exercises (OP) - 03/04/22 0001       ADLs   Toileting Worked in seated on weight shifting left/right to clear hips.  Patient able to shift weight onto left hip and actively lift right hip and reach to buttocks as needed for hygiene.  Patient could weight shift on the opposite side, but was not able to use her left hand to reach behind due to limited elbow range.    Functional Mobility  Worked on transfer back to wheelchair - moving toward left.  Patient with feet maintaned on footplates.  Patient able to maintain forward weight shift and use right arm to push , and slight push from legs, to lift bottom to transfer back to chair with mod assist.  Patient then able to unweight right hip to grasp slide board and begin to remove it from under hips.    ADL Comments Reviewed short and long term goals with patient.  Patient in agreement.  Patient eager for increased function with ADL for instances when she does not have a caregiver, or when she travels.      Manual Therapy   Manual Therapy Passive ROM    Passive ROM left wrist and digits toward neutral wrist and increased extension in MCP's especially digits 4-5.                    OT Education - 03/04/22 1732     Education Details reviewed OT goals    Person(s) Educated Patient    Methods Explanation    Comprehension Verbalized understanding  OT Short Term Goals - 03/04/22 1733       OT SHORT TERM GOAL #1   Title Patient will complete an HEP designed to improve range of motion in Left hand/elbow    Baseline No current HEP    Time 4    Period Weeks    Status On-going    Target Date 04/04/22      OT SHORT TERM GOAL #2   Title Patient will demonstrate ability to don/doff updated left hand splint and demo understanding of wearing schedule    Baseline does not have current hand splint s/p tendon transfer    Time 4    Period Weeks    Status On-going      OT SHORT TERM GOAL #3   Title Patient will demonstrate ability to assist with pulling up/down pants over hips after tolieting or with daily dressing/undressing    Baseline Dependent    Time 4    Period Weeks    Status On-going      OT SHORT TERM GOAL #4   Title Patient will explore rolling shower chair options to use with her lift system that will improve safety with shower transfers    Baseline caregivers lifting onto shower seat    Time  4    Period Weeks    Status On-going               OT Long Term Goals - 03/04/22 1734       OT LONG TERM GOAL #1   Title Patient will complete updated HEP to address grasp and pinch ability in LUE    Baseline No current HEP    Time 8    Period Weeks    Status On-going    Target Date 05/04/22      OT LONG TERM GOAL #2   Title Patient will complete at least 8 box on box and blocks test (LUE)    Baseline 2 blocks    Time 8    Period Weeks    Status On-going      OT LONG TERM GOAL #3   Title Patient will roll left/right to assist with donning of incontinence brief, or explore pull up type brief    Baseline dependent    Time 8    Period Weeks    Status On-going      OT LONG TERM GOAL #4   Title Patient will unweight hips with min assist to aide with toilet hygiene    Baseline dependent    Time 8    Period Weeks    Status On-going      OT LONG TERM GOAL #5   Title Patient will feed self finger foods with left hand after set up    Baseline Not using left hand for feeding    Time 8    Period Weeks    Status On-going                   Plan - 03/04/22 1732     Clinical Impression Statement Patient eager for improved participation in ADL, and improved functional use of LUE.    OT Occupational Profile and History Problem Focused Assessment - Including review of records relating to presenting problem    Occupational performance deficits (Please refer to evaluation for details): ADL's;IADL's    Body Structure / Function / Physical Skills ADL;Coordination;Endurance;GMC;UE functional use;Balance;Fascial restriction;Skin integrity;Pain;IADL;Flexibility;Decreased knowledge of use of DME;Body mechanics;Dexterity;FMC;Strength;Tone;ROM;Mobility;Edema    Rehab Potential Good  Clinical Decision Making Several treatment options, min-mod task modification necessary    Comorbidities Affecting Occupational Performance: May have comorbidities impacting occupational  performance    Modification or Assistance to Complete Evaluation  No modification of tasks or assist necessary to complete eval    OT Frequency 1x / week    OT Duration 8 weeks    OT Treatment/Interventions Self-care/ADL training;Electrical Stimulation;Therapeutic exercise;Aquatic Therapy;Moist Heat;Neuromuscular education;Splinting;Patient/family education;Balance training;Therapeutic activities;Functional Mobility Training;Fluidtherapy;Ultrasound;Contrast Bath;Cryotherapy;DME and/or AE instruction;Manual Therapy;Passive range of motion    Plan Review current hand splint if she brings it and consider new splint for LUE, Problem solve LB dressing equipment, positioning, including brief    Consulted and Agree with Plan of Care Patient             Patient will benefit from skilled therapeutic intervention in order to improve the following deficits and impairments:   Body Structure / Function / Physical Skills: ADL, Coordination, Endurance, GMC, UE functional use, Balance, Fascial restriction, Skin integrity, Pain, IADL, Flexibility, Decreased knowledge of use of DME, Body mechanics, Dexterity, FMC, Strength, Tone, ROM, Mobility, Edema       Visit Diagnosis: Abnormal posture  Muscle weakness (generalized)  Spastic quadriplegic cerebral palsy (HCC)  Other symptoms and signs involving the nervous system  Stiffness of left hand, not elsewhere classified  Stiffness of left wrist, not elsewhere classified    Problem List Patient Active Problem List   Diagnosis Date Noted   Dysphagia 05/31/2021   Epigastric pain 05/31/2021   Gastroesophageal reflux disease 05/31/2021   Hematemesis 05/31/2021   Hiatal hernia 05/31/2021   Microcytic anemia 05/31/2021   Sagittal band rupture, extensor tendon, nontraumatic, left 05/01/2021   Cerebral palsy (HCC) 04/25/2020   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 [place], [place], [address] Phone: [phone]   Fax:  [phone]  Name: Christine Gomez MRN: [id number] Date of Birth: [birth year]

## 2022-03-10 ENCOUNTER — Ambulatory Visit (INDEPENDENT_AMBULATORY_CARE_PROVIDER_SITE_OTHER): Payer: Medicaid Other | Admitting: Podiatry

## 2022-03-10 ENCOUNTER — Other Ambulatory Visit: Payer: Self-pay

## 2022-03-10 DIAGNOSIS — M79675 Pain in left toe(s): Secondary | ICD-10-CM | POA: Diagnosis not present

## 2022-03-10 DIAGNOSIS — B351 Tinea unguium: Secondary | ICD-10-CM

## 2022-03-10 DIAGNOSIS — M79609 Pain in unspecified limb: Secondary | ICD-10-CM

## 2022-03-10 DIAGNOSIS — M79674 Pain in right toe(s): Secondary | ICD-10-CM | POA: Diagnosis not present

## 2022-03-11 ENCOUNTER — Ambulatory Visit: Payer: Medicaid Other | Admitting: Occupational Therapy

## 2022-03-11 ENCOUNTER — Encounter: Payer: Self-pay | Admitting: Occupational Therapy

## 2022-03-11 ENCOUNTER — Ambulatory Visit: Payer: Medicaid Other | Admitting: Physical Therapy

## 2022-03-11 ENCOUNTER — Ambulatory Visit: Payer: Medicaid Other

## 2022-03-11 ENCOUNTER — Encounter: Payer: Self-pay | Admitting: Physical Therapy

## 2022-03-11 DIAGNOSIS — M25632 Stiffness of left wrist, not elsewhere classified: Secondary | ICD-10-CM

## 2022-03-11 DIAGNOSIS — R293 Abnormal posture: Secondary | ICD-10-CM | POA: Diagnosis not present

## 2022-03-11 DIAGNOSIS — M6281 Muscle weakness (generalized): Secondary | ICD-10-CM

## 2022-03-11 DIAGNOSIS — R29818 Other symptoms and signs involving the nervous system: Secondary | ICD-10-CM

## 2022-03-11 DIAGNOSIS — G800 Spastic quadriplegic cerebral palsy: Secondary | ICD-10-CM

## 2022-03-11 DIAGNOSIS — M25642 Stiffness of left hand, not elsewhere classified: Secondary | ICD-10-CM

## 2022-03-11 NOTE — Therapy (Signed)
[facility] ?[place], [place], [address] ?Phone: [phone]   Fax:  [phone] ? ?Occupational Therapy Treatment ? ?Patient Details  ?Name: Christine Gomez ?MRN: [id number] ?Date of Birth: Sep 25, 1986 ?Referring Provider (OT): Everardo Beals, NP ? ? ?Encounter Date: 03/11/2022 ? ? OT End of Session - 03/11/22 1537   ? ? Visit Number 3   ? Number of Visits 9   ? Date for OT Re-Evaluation 05/04/22   ? Authorization Type traditional medicaid   ? Progress Note Due on Visit 10   ? OT Start Time 1405   ? OT Stop Time 1450   ? OT Time Calculation (min) 45 min   ? Activity Tolerance Patient tolerated treatment well   ? Behavior During Therapy [facility] for tasks assessed/performed   ? ?  ?  ? ?  ? ? ?Past Medical History:  ?Diagnosis Date  ? Acid reflux   ? Cerebral palsy ([place])   ? ? ?Past Surgical History:  ?Procedure Laterality Date  ? dorsal rhizotomy    ? back ligament looosening surgery  ? ESOPHAGOGASTRODUODENOSCOPY (EGD) WITH PROPOFOL N/A 10/08/2016  ? Procedure: ESOPHAGOGASTRODUODENOSCOPY (EGD) WITH PROPOFOL;  Surgeon: Arta Silence, MD;  Location: [facility];  Service: Endoscopy;  Laterality: N/A;  ? left arm surgery    ? ? ?There were no vitals filed for this visit. ? ? Subjective Assessment - 03/11/22 1530   ? ? Subjective  I am sleepy today. Patient report working late at night   ? Currently in Pain? No/denies   ? Pain Score 0-No pain   ? ?  ?  ? ?  ? ? ? ? ? ? ? ? ? ? ? ? ? ? ? OT Treatments/Exercises (OP) - 03/11/22 0001   ? ?  ? Neurological Re-education Exercises  ? Other Exercises 1 Transferred toward right side - with total/ max assist.  Patient able to weight shift laterally and attempt to place slide board, but had difficulty with pushing into feet to unweight seat to aide with transfer.   ?  ? Splinting  ? Splinting Fabricated resting hand splint for patient to increase stretch to left hand - specificaly 4th/5th digits to promote  MCP/PIP extension.  Patient unable to sel don the splint despite multiple atttempts.  Increased attempts to don splint lead to increased tension in left hand.  Patient has caregiver and caregiver can assist with donning splint. Patientinstructed to wear x 1 hour today, then if no problems can wear as tolerated at night.  Patient familiar with this type of splint from post surgical splint.  Splint fabricated with wrist slightly flexed, and MCP partially flexed with increased PIP extension.   ? ?  ?  ? ?  ? ? ? ? ? ? ? ? ? ? ? OT Short Term Goals - 03/11/22 1538   ? ?  ? OT SHORT TERM GOAL #1  ? Title Patient will complete an HEP designed to improve range of motion in Left hand/elbow   ? Baseline No current HEP   ? Time 4   ? Period Weeks   ? Status On-going   ? Target Date 04/04/22   ?  ? OT SHORT TERM GOAL #2  ? Title Patient will demonstrate ability to don/doff updated left hand splint and demo understanding of wearing schedule   ? Baseline does not have current hand splint s/p tendon transfer   ? Time 4   ? Period  Weeks   ? Status On-going   ?  ? OT SHORT TERM GOAL #3  ? Title Patient will demonstrate ability to assist with pulling up/down pants over hips after tolieting or with daily dressing/undressing   ? Baseline Dependent   ? Time 4   ? Period Weeks   ? Status On-going   ?  ? OT SHORT TERM GOAL #4  ? Title Patient will explore rolling shower chair options to use with her lift system that will improve safety with shower transfers   ? Baseline caregivers lifting onto shower seat   ? Time 4   ? Period Weeks   ? Status On-going   ? ?  ?  ? ?  ? ? ? ? OT Long Term Goals - 03/11/22 1538   ? ?  ? OT LONG TERM GOAL #1  ? Title Patient will complete updated HEP to address grasp and pinch ability in LUE   ? Baseline No current HEP   ? Time 8   ? Period Weeks   ? Status On-going   ? Target Date 05/04/22   ?  ? OT LONG TERM GOAL #2  ? Title Patient will complete at least 8 box on box and blocks test (LUE)   ? Baseline 2  blocks   ? Time 8   ? Period Weeks   ? Status On-going   ?  ? OT LONG TERM GOAL #3  ? Title Patient will roll left/right to assist with donning of incontinence brief, or explore pull up type brief   ? Baseline dependent   ? Time 8   ? Period Weeks   ? Status On-going   ?  ? OT LONG TERM GOAL #4  ? Title Patient will unweight hips with min assist to aide with toilet hygiene   ? Baseline dependent   ? Time 8   ? Period Weeks   ? Status On-going   ?  ? OT LONG TERM GOAL #5  ? Title Patient will feed self finger foods with left hand after set up   ? Baseline Not using left hand for feeding   ? Time 8   ? Period Weeks   ? Status On-going   ? ?  ?  ? ?  ? ? ? ? ? ? ? ? Plan - 03/11/22 1537   ? ? Clinical Impression Statement Patient eager for improved participation in ADL, and improved functional use of LUE.   ? OT Occupational Profile and History Problem Focused Assessment - Including review of records relating to presenting problem   ? Occupational performance deficits (Please refer to evaluation for details): ADL's;IADL's   ? Body Structure / Function / Physical Skills ADL;Coordination;Endurance;GMC;UE functional use;Balance;Fascial restriction;Skin integrity;Pain;IADL;Flexibility;Decreased knowledge of use of DME;Body mechanics;Dexterity;FMC;Strength;Tone;ROM;Mobility;Edema   ? Rehab Potential Good   ? Clinical Decision Making Several treatment options, min-mod task modification necessary   ? Comorbidities Affecting Occupational Performance: May have comorbidities impacting occupational performance   ? Modification or Assistance to Complete Evaluation  No modification of tasks or assist necessary to complete eval   ? OT Frequency 1x / week   ? OT Duration 8 weeks   ? OT Treatment/Interventions Self-care/ADL training;Electrical Stimulation;Therapeutic exercise;Aquatic Therapy;Moist Heat;Neuromuscular education;Splinting;Patient/family education;Balance training;Therapeutic activities;Functional Mobility  Training;Fluidtherapy;Ultrasound;Contrast Bath;Cryotherapy;DME and/or AE instruction;Manual Therapy;Passive range of motion   ? Plan Review current hand splint if she brings it and consider new splint for LUE, Problem solve LB dressing equipment, positioning, including brief   ?  Consulted and Agree with Plan of Care Patient   ? ?  ?  ? ?  ? ? ?Patient will benefit from skilled therapeutic intervention in order to improve the following deficits and impairments:   ?Body Structure / Function / Physical Skills: ADL, Coordination, Endurance, GMC, UE functional use, Balance, Fascial restriction, Skin integrity, Pain, IADL, Flexibility, Decreased knowledge of use of DME, Body mechanics, Dexterity, FMC, Strength, Tone, ROM, Mobility, Edema ?  ?  ? ? ?Visit Diagnosis: ?Abnormal posture ? ?Muscle weakness (generalized) ? ?Spastic quadriplegic cerebral palsy ([place]) ? ?Other symptoms and signs involving the nervous system ? ?Stiffness of left hand, not elsewhere classified ? ?Stiffness of left wrist, not elsewhere classified ? ? ? ?Problem List ?Patient Active Problem List  ? Diagnosis Date Noted  ? Dysphagia 05/31/2021  ? Epigastric pain 05/31/2021  ? Gastroesophageal reflux disease 05/31/2021  ? Hematemesis 05/31/2021  ? Hiatal hernia 05/31/2021  ? Microcytic anemia 05/31/2021  ? Sagittal band rupture, extensor tendon, nontraumatic, left 05/01/2021  ? Cerebral palsy ([place]) 04/25/2020  ? 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 Other acquired deformity of other parts of limb 04/25/2020  ? 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 Unspecified deformity of forearm, excluding fingers 04/25/2020  ? Urinary incontinence 04/25/2020  ? Iron deficiency anemia due to chronic blood loss 07/10/2017  ? ? ?Mariah Milling, OT ?03/11/2022, 3:39 PM ? ?[place] ?[address] ?[place], [place],  [address] ?Phone: [phone]   Fax:  [phone] ? ?Name: Sabirin Kravchenko Raske ?MRN: [id number] ?Date of Birth: November 14, 1986 ? ?

## 2022-03-11 NOTE — Therapy (Signed)
?OUTPATIENT PHYSICAL THERAPY TREATMENT NOTE ? ? ?Patient Name: Christine Gomez ?MRN: [id number] ?DOB:June 27, 1986, 36 y.o., female ?Today's Date: 03/11/2022 ? ?PCP: Marva Panda, NP ?REFERRING PROVIDER: Marva Panda, NP ? ? PT End of Session - 03/11/22 1607   ? ? Visit Number 5   ? Number of Visits 9   ? Date for PT Re-Evaluation 04/11/22   ? Authorization Type Traditional MCD (VL: 27)   ? Authorization Time Period Approved for Initial 3 Visits from  02/18/22 - 03/10/22; awaiting additional authorization   ? Authorization - Visit Number 4   ? Authorization - Number of Visits 4   ? PT Start Time 1455   ? PT Stop Time 1550   ? PT Time Calculation (min) 55 min   ? Equipment Utilized During Treatment Gait belt   ? Activity Tolerance Patient tolerated treatment well   ? Behavior During Therapy [facility] for tasks assessed/performed   ? ?  ?  ? ?  ? ? ?Past Medical History:  ?Diagnosis Date  ? Acid reflux   ? Cerebral palsy (HCC)   ? ?Past Surgical History:  ?Procedure Laterality Date  ? dorsal rhizotomy    ? back ligament looosening surgery  ? ESOPHAGOGASTRODUODENOSCOPY (EGD) WITH PROPOFOL N/A 10/08/2016  ? Procedure: ESOPHAGOGASTRODUODENOSCOPY (EGD) WITH PROPOFOL;  Surgeon: Willis Modena, MD;  Location: [facility];  Service: Endoscopy;  Laterality: N/A;  ? left arm surgery    ? ?Patient Active Problem List  ? Diagnosis Date Noted  ? Dysphagia 05/31/2021  ? Epigastric pain 05/31/2021  ? Gastroesophageal reflux disease 05/31/2021  ? Hematemesis 05/31/2021  ? Hiatal hernia 05/31/2021  ? Microcytic anemia 05/31/2021  ? Sagittal band rupture, extensor tendon, nontraumatic, left 05/01/2021  ? Cerebral palsy (HCC) 04/25/2020  ? 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 Other acquired deformity of other parts of limb 04/25/2020  ? 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 Unspecified deformity of forearm, excluding fingers 04/25/2020  ? Urinary  incontinence 04/25/2020  ? Iron deficiency anemia due to chronic blood loss 07/10/2017  ? ? ?REFERRING DIAG: Cerebral Palsy  ? ?THERAPY DIAG:  ?Abnormal posture ? ?Muscle weakness (generalized) ? ?Spastic quadriplegic cerebral palsy (HCC) ? ?PERTINENT HISTORY: CP, Acid Reflux  ? ?PRECAUTIONS: Fall, B Hip Dislocation ? ?SUBJECTIVE: Pt states she did not sleep until 3am last night due to work and she is tired today. ? ?PAIN:  ?Are you having pain? No ? ?TODAY'S TREATMENT:  ?Pt received from OT on EOM requiring ModA for periscapular and trunk support from R elbow prop into upright in preparation for sliding board transfer to power w/c.  Mass practice sliding board transfers mat<>chair on unlevel surface x3 w/ modA-MaxA with therapist managing LEs mid-transition to prevent torsion at knees.  Pt performs slight uphill transfer mat>w/c and slight downhill transfer w/c>mat.  Uphill transfer requiring inc assist during initial boost over edge of sliding board.  Pt demo inc push in BLE during task, especially in mid-range of activity, with facilitation provided at knees for stability and at upper trunk for forward lean.  Pt cued for initiation of forward lean using RUE to reach and intermittently pull and self-pacing of activity with pt counting to facilitate inc engagement to task.  Pt able to offload bottom using reach and pull with minA to complete lean to allow therapist to place and remove sliding board. ? ?PT practices lateral scooting into chair at diagonal x3 w/o assistance from  therapist to promote LE engagement.  Requiring MaxA to complete slide fully into chair.  Pt not getting as much LE activation at end of transfer practice. ? ?PT placed sling for stander under gluts using cues to facilitate forward reach to knee pads on stander and use of LE for glut activation w/o liftoff from seat.  Pt cued for reach to tabletop during mid-rise to standing to facilitate upright.  Pt able to maintain good forward trunk  posture throughout standing.  Performs 2 bouts standing, initial for 37sec before fatigue.  Rest x64mins.  Second stand for w/ use of RUE using functional reach to roll 1lb weighted ball across tabletop forward and diagonal x10 to promote upright posture and core engagement.  Pt performs weighted ball pickup and place on tabletop x5 w/ RUE during standing.  Standing activity discontinued as pt began to experience leg spasms during weight-shift with UE task on tabletop. ? ?Pt requires ModA to reposition in chair at end of session with use of tilt feature and sling from stander to adjust hips and to promote proper LE placement. ? ? ? ? ?PATIENT EDUCATION: ?Education details: Continued edu on core engagement during functional tasks and head-hip relationship during transfer practice. ?Person educated: Patient ?Education method: Explanation ?Education comprehension: verbalized understanding ? ? ?HOME EXERCISE PROGRAM: ?Access Code: [email or web address] ? ? PT Short Term Goals - 02/13/22 1335   ? ?  ? PT SHORT TERM GOAL #1  ? Title Pt will be able to verbalize understanding of techniques to improve proper positioning of BLE and stretching program with assistance (ALL STGs Due at 5th Visit)   ? Baseline no HEP established; verbalize understanding   ? Time 4   ? Period --   Visits  ? Status Achieved  ?  ? PT SHORT TERM GOAL #2  ? Title Patient will be able to stand in [place] for >/= 3 minute to demo improved activity and standing tolerance   ? Baseline TBA; 3 mins, 40 seconds on 2/27  ? Time 4   ? Period --   visits  ? Status Achieved  ?  ? PT SHORT TERM GOAL #3  ? Title Pt will be able to sit EOB without UE support for >/= 30 seconds to demo improved static seated balance   ? Baseline 15 seconds; able to complete without UE support 1-2 minute, approx 30-45 seconds with dynamic balance.  ? Time 4   ? Period --   visits  ? Status Achieved  ? ?  ?  ? ?  ? ? ? PT Long Term Goals - 02/13/22 1338   ? ?  ? PT LONG TERM GOAL #1  ?  Title Pt will improve static seated balance without UE support to >/= 45 seconds to demo improved static seated tolerance for ADL's (ALL LTGs Due on 8th Visit)   ? Baseline 15 seconds   ? Time 8   ? Period --   visits  ? Status New   ?  ? PT LONG TERM GOAL #2  ? Title Pt will be able to perform slideboard transfer with mod A for improved ability to participate in transfers and independence.   ? Baseline Max A   ? Time 8   ? Period --   visits  ? Status New   ?  ? PT LONG TERM GOAL #3  ? Title Patient will demo ability to stand in Standing Frame for >/= 5 minutes for improved functional  strength/ROM   ? Baseline TBA, 3 mins 45 seconds at prior POC   ? Time 8   ? Period Weeks   ? Status New   ?  ? PT LONG TERM GOAL #4  ? Title Patient will improve Knee Extension PROM by 5 degress bilaterally   ? Baseline -31 on LLE, -24 on RLE   ? Time 8   ? Period --   visits  ? Status New   ? ?  ?  ? ?  ? ? ? Plan - 03/04/22 1539   ? ? Clinical Impression Statement Pt completed mass task practice of transfers during session with modA-maxA.  She continues to be challenged by fatigue from sleep schedule and core engagement during functional tasks.  Her standing tolerance is demonstrating improvement as is her LE initiation during sliding board transfers.  Continue per POC.  ? Personal Factors and Comorbidities Comorbidity 2;Time since onset of injury/illness/exacerbation;Transportation   ? Comorbidities CP, Acid Reflux   ? Examination-Activity Limitations Bathing;Bed Mobility;Toileting;Transfers;Reach Overhead;Stand;Sit   ? Examination-Participation Restrictions Interpersonal Relationship;Community Activity;Occupation;Meal Prep;School   ? Stability/Clinical Decision Making Stable/Uncomplicated   ? Rehab Potential Fair   ? PT Frequency 1x / week   ? PT Duration 8 weeks   plus eval  ? PT Treatment/Interventions ADLs/Self Care Home Management;Cryotherapy;Electrical Stimulation;Moist Heat;DME Instruction;Gait training;Functional mobility  training;Therapeutic activities;Therapeutic exercise;Balance training;Neuromuscular re-education;Patient/family education;Wheelchair mobility training;Manual techniques;Vestibular;Joint Manipulations;Spinal Manipu

## 2022-03-16 ENCOUNTER — Encounter: Payer: Self-pay | Admitting: Podiatry

## 2022-03-16 NOTE — Progress Notes (Signed)
?  Subjective:  ?Patient ID: Christine Gomez, female    DOB: [birth year],  MRN: [id number] ? ?Christine Gomez presents to clinic today with h/o cerebral palsy for painful elongated mycotic toenails 1-5 bilaterally which are tender when wearing enclosed shoe gear. Pain is relieved with periodic professional debridement. ? ?New problem(s): None.  ? ?PCP is Marva Panda, NP , and last visit was January 09, 2022. ? ?Allergies  ?Allergen Reactions  ? Lac Bovis   ? Lactose Diarrhea  ? Nickel Itching and Swelling  ? ? ?Review of Systems: Negative except as noted in the HPI. ? ?Objective: No changes noted in today's physical examination. ?General: Patient is a pleasant 36 y.o. African American female, obese in NAD. AAO x 3.  ? ?Neurovascular Examination: ?Capillary refill time to digits immediate b/l. Palpable DP pulse(s) b/l LE. Palpable PT pulse(s) b/l LE. Pedal hair present. No pain with calf compression RLE. Dependent edema noted b/l LE. No cyanosis or clubbing noted b/l LE. ? ?Protective sensation intact 5/5 intact bilaterally with 10g monofilament b/l. Proprioception intact bilaterally. ? ?Dermatological:  ?Pedal integument with normal turgor, texture and tone BLE. No open wounds b/l LE. No interdigital macerations noted b/l LE. Toenails 1-5 b/l elongated, discolored, dystrophic, thickened, crumbly with subungual debris and tenderness to dorsal palpation. No hyperkeratotic nor porokeratotic lesions present on today's visit. ? ?Musculoskeletal:  ?Noted disuse atrophy b/l lower extremities. No pain, crepitus or joint limitation noted with ROM bilateral LE. Utilizes motorized chair for mobility assistance. ? ?Assessment/Plan: ?1. Pain due to onychomycosis of nail   ?-Examined patient. ?-Toenails 1-5 b/l were debrided in length and girth with sterile nail nippers and dremel without iatrogenic bleeding.  ?-Patient/POA to call should there be question/concern in the interim.  ? ?Return in about 3 months (around  06/10/2022). ? ?Freddie Breech, DPM  ?

## 2022-03-18 ENCOUNTER — Other Ambulatory Visit: Payer: Self-pay

## 2022-03-18 ENCOUNTER — Ambulatory Visit: Payer: Medicaid Other

## 2022-03-18 ENCOUNTER — Encounter: Payer: Self-pay | Admitting: Occupational Therapy

## 2022-03-18 ENCOUNTER — Ambulatory Visit: Payer: Medicaid Other | Admitting: Occupational Therapy

## 2022-03-18 DIAGNOSIS — R29818 Other symptoms and signs involving the nervous system: Secondary | ICD-10-CM

## 2022-03-18 DIAGNOSIS — R2689 Other abnormalities of gait and mobility: Secondary | ICD-10-CM

## 2022-03-18 DIAGNOSIS — R293 Abnormal posture: Secondary | ICD-10-CM

## 2022-03-18 DIAGNOSIS — M6281 Muscle weakness (generalized): Secondary | ICD-10-CM

## 2022-03-18 DIAGNOSIS — M25642 Stiffness of left hand, not elsewhere classified: Secondary | ICD-10-CM

## 2022-03-18 DIAGNOSIS — G800 Spastic quadriplegic cerebral palsy: Secondary | ICD-10-CM

## 2022-03-18 DIAGNOSIS — M25632 Stiffness of left wrist, not elsewhere classified: Secondary | ICD-10-CM

## 2022-03-18 NOTE — Therapy (Signed)
[facility] ?[address] ?[place], [place], [address] ?Phone: [phone]   Fax:  [phone] ? ?Occupational Therapy Treatment ? ?Patient Details  ?Name: Christine Gomez ?MRN: [id number] ?Date of Birth: 05/28/1986 ?Referring Provider (OT): Everardo Beals, NP ? ? ?Encounter Date: 03/18/2022 ? ? OT End of Session - 03/18/22 1550   ? ? Visit Number 4   ? Number of Visits 9   ? Date for OT Re-Evaluation 05/04/22   ? Authorization Type traditional medicaid   ? Progress Note Due on Visit 10   ? OT Start Time [id number]   ? OT Stop Time [id number]   ? OT Time Calculation (min) 50 min   ? Equipment Utilized During Treatment transfer board   ? Activity Tolerance Patient tolerated treatment well   ? Behavior During Therapy [facility] for tasks assessed/performed   ? ?  ?  ? ?  ? ? ?Past Medical History:  ?Diagnosis Date  ? Acid reflux   ? Cerebral palsy ([place])   ? ? ?Past Surgical History:  ?Procedure Laterality Date  ? dorsal rhizotomy    ? back ligament looosening surgery  ? ESOPHAGOGASTRODUODENOSCOPY (EGD) WITH PROPOFOL N/A 10/08/2016  ? Procedure: ESOPHAGOGASTRODUODENOSCOPY (EGD) WITH PROPOFOL;  Surgeon: Arta Silence, MD;  Location: [facility];  Service: Endoscopy;  Laterality: N/A;  ? left arm surgery    ? ? ?There were no vitals filed for this visit. ? ? Subjective Assessment - 03/18/22 1453   ? ? Subjective  Patient reports she is still investigating rolling shower chair   ? Currently in Pain? No/denies   ? Pain Score 0-No pain   ? ?  ?  ? ?  ? ? ? ? ? ? ? ? ? ? ? ? ? ? ? OT Treatments/Exercises (OP) - 03/18/22 0001   ? ?  ? ADLs  ? Functional Mobility Continue to work on mechanics of level surface transfer.  Today used short transfer board - patient better able to remove slide board from under hip.  Continue with emphasis on forward weight shift and pushing through legs to assist with transfer.  Patient with improving sitting balance static to dynamic.   ?  ? Neurological  Re-education Exercises  ? Other Exercises 1 Worked on isolated movement of left elbow, forearm, wrist and digits following prolonged passive stretch.   ?  ? Modalities  ? Modalities Ultrasound   ?  ? Ultrasound  ? Ultrasound Location elbow, forearm   ? Ultrasound Parameters 47mhz, continuous, 0.8 w/cm2, x 8 min   ? Ultrasound Goals Edema;Pain   ?  ? Splinting  ? Splinting Patient reports splint is working well, although she feels she could be wearing it more often   ?  ? Manual Therapy  ? Manual Therapy Passive ROM   ? Passive ROM left elbow, forearm, wrist, digits   ? ?  ?  ? ?  ? ? ? ? ? ? ? ? ? ? ? OT Short Term Goals - 03/18/22 1553   ? ?  ? OT SHORT TERM GOAL #1  ? Title Patient will complete an HEP designed to improve range of motion in Left hand/elbow   ? Baseline No current HEP   ? Time 4   ? Period Weeks   ? Status On-going   ? Target Date 04/04/22   ?  ? OT SHORT TERM GOAL #2  ? Title Patient will demonstrate ability to don/doff updated left hand splint  and demo understanding of wearing schedule   ? Baseline does not have current hand splint s/p tendon transfer   ? Time 4   ? Period Weeks   ? Status On-going   ?  ? OT SHORT TERM GOAL #3  ? Title Patient will demonstrate ability to assist with pulling up/down pants over hips after tolieting or with daily dressing/undressing   ? Baseline Dependent   ? Time 4   ? Period Weeks   ? Status On-going   ?  ? OT SHORT TERM GOAL #4  ? Title Patient will explore rolling shower chair options to use with her lift system that will improve safety with shower transfers   ? Baseline caregivers lifting onto shower seat   ? Time 4   ? Period Weeks   ? Status On-going   ? ?  ?  ? ?  ? ? ? ? OT Long Term Goals - 03/18/22 1553   ? ?  ? OT LONG TERM GOAL #1  ? Title Patient will complete updated HEP to address grasp and pinch ability in LUE   ? Baseline No current HEP   ? Time 8   ? Period Weeks   ? Status On-going   ? Target Date 05/04/22   ?  ? OT LONG TERM GOAL #2  ? Title  Patient will complete at least 8 box on box and blocks test (LUE)   ? Baseline 2 blocks   ? Time 8   ? Period Weeks   ? Status On-going   ?  ? OT LONG TERM GOAL #3  ? Title Patient will roll left/right to assist with donning of incontinence brief, or explore pull up type brief   ? Baseline dependent   ? Time 8   ? Period Weeks   ? Status On-going   ?  ? OT LONG TERM GOAL #4  ? Title Patient will unweight hips with min assist to aide with toilet hygiene   ? Baseline dependent   ? Time 8   ? Period Weeks   ? Status On-going   ?  ? OT LONG TERM GOAL #5  ? Title Patient will feed self finger foods with left hand after set up   ? Baseline Not using left hand for feeding   ? Time 8   ? Period Weeks   ? Status On-going   ? ?  ?  ? ?  ? ? ? ? ? ? ? ? Plan - 03/18/22 1551   ? ? Clinical Impression Statement Patient eager for improved participation in ADL, and improved functional use of LUE.   ? OT Occupational Profile and History Problem Focused Assessment - Including review of records relating to presenting problem   ? Occupational performance deficits (Please refer to evaluation for details): ADL's;IADL's   ? Body Structure / Function / Physical Skills ADL;Coordination;Endurance;GMC;UE functional use;Balance;Fascial restriction;Skin integrity;Pain;IADL;Flexibility;Decreased knowledge of use of DME;Body mechanics;Dexterity;FMC;Strength;Tone;ROM;Mobility;Edema   ? Rehab Potential Good   ? Clinical Decision Making Several treatment options, min-mod task modification necessary   ? Comorbidities Affecting Occupational Performance: May have comorbidities impacting occupational performance   ? Modification or Assistance to Complete Evaluation  No modification of tasks or assist necessary to complete eval   ? OT Frequency 1x / week   ? OT Duration 8 weeks   ? OT Treatment/Interventions Self-care/ADL training;Electrical Stimulation;Therapeutic exercise;Aquatic Therapy;Moist Heat;Neuromuscular education;Splinting;Patient/family  education;Balance training;Therapeutic activities;Functional Mobility Training;Fluidtherapy;Ultrasound;Contrast Bath;Cryotherapy;DME and/or AE instruction;Manual Therapy;Passive range of motion   ?  Plan Review current hand splint if she brings it and consider new splint for LUE, Problem solve LB dressing equipment, positioning, including brief   ? Consulted and Agree with Plan of Care Patient   ? ?  ?  ? ?  ? ? ?Patient will benefit from skilled therapeutic intervention in order to improve the following deficits and impairments:   ?Body Structure / Function / Physical Skills: ADL, Coordination, Endurance, GMC, UE functional use, Balance, Fascial restriction, Skin integrity, Pain, IADL, Flexibility, Decreased knowledge of use of DME, Body mechanics, Dexterity, FMC, Strength, Tone, ROM, Mobility, Edema ?  ?  ? ? ?Visit Diagnosis: ?Abnormal posture ? ?Muscle weakness (generalized) ? ?Spastic quadriplegic cerebral palsy ([place]) ? ?Other symptoms and signs involving the nervous system ? ?Stiffness of left hand, not elsewhere classified ? ?Stiffness of left wrist, not elsewhere classified ? ? ? ?Problem List ?Patient Active Problem List  ? Diagnosis Date Noted  ? Dysphagia 05/31/2021  ? Epigastric pain 05/31/2021  ? Gastroesophageal reflux disease 05/31/2021  ? Hematemesis 05/31/2021  ? Hiatal hernia 05/31/2021  ? Microcytic anemia 05/31/2021  ? Sagittal band rupture, extensor tendon, nontraumatic, left 05/01/2021  ? Cerebral palsy ([place]) 04/25/2020  ? 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 Other acquired deformity of other parts of limb 04/25/2020  ? 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 Unspecified deformity of forearm, excluding fingers 04/25/2020  ? Urinary incontinence 04/25/2020  ? Iron deficiency anemia due to chronic blood loss 07/10/2017  ? ? ?Mariah Milling, OT ?03/18/2022, 3:54 PM ? ?[facility] [place] ?[address] ?[place], [place], [address] ?Phone: [phone]   Fax:  [phone] ? ?Name: Christine Gomez ?MRN: [id number] ?Date of Birth: 03-24-1986 ? ?

## 2022-03-18 NOTE — Therapy (Signed)
?OUTPATIENT PHYSICAL THERAPY TREATMENT NOTE ? ? ?Patient Name: Christine Gomez ?MRN: [id number] ?DOB:Sep 22, 1986, 36 y.o., female ?Today's Date: 03/18/2022 ? ?PCP: Marva PandaMillsaps, Kimberly, NP ?REFERRING PROVIDER: Marva PandaMillsaps, Kimberly, NP ? ? PT End of Session - 03/18/22 1447   ? ? Visit Number 6   ? Number of Visits 9   ? Date for PT Re-Evaluation 04/11/22   ? Authorization Type Traditional MCD (VL: 27)   ? Authorization Time Period Approved for Initial 3 Visits from  02/18/22 - 03/10/22; awaiting additional authorization; [employer] has approved 5 PT visits 03/11/22 - 04/14/22   ? Authorization - Visit Number 5   ? Authorization - Number of Visits 9   ? PT Start Time 1446   ? PT Stop Time 1530   ? PT Time Calculation (min) 44 min   ? Equipment Utilized During Treatment Gait belt   ? Activity Tolerance Patient tolerated treatment well   ? [facility] tasks assessed/performed   ? ?  ?  ? ?  ? ? ?Past Medical History:  ?Diagnosis Date  ? Acid reflux   ? Cerebral palsy (HCC)   ? ?Past Surgical History:  ?Procedure Laterality Date  ? dorsal rhizotomy    ? back ligament looosening surgery  ? ESOPHAGOGASTRODUODENOSCOPY (EGD) WITH PROPOFOL N/A 10/08/2016  ? Procedure: ESOPHAGOGASTRODUODENOSCOPY (EGD) WITH PROPOFOL;  Surgeon: Willis ModenaWilliam Outlaw, MD;  Location: [facility];  Service: Endoscopy;  Laterality: N/A;  ? left arm surgery    ? ?Patient Active Problem List  ? Diagnosis Date Noted  ? Dysphagia 05/31/2021  ? Epigastric pain 05/31/2021  ? Gastroesophageal reflux disease 05/31/2021  ? Hematemesis 05/31/2021  ? Hiatal hernia 05/31/2021  ? Microcytic anemia 05/31/2021  ? Sagittal band rupture, extensor tendon, nontraumatic, left 05/01/2021  ? Cerebral palsy (HCC) 04/25/2020  ? 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 Other acquired deformity of other parts of limb 04/25/2020  ? 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 Unspecified deformity of  forearm, excluding fingers 04/25/2020  ? Urinary incontinence 04/25/2020  ? Iron deficiency anemia due to chronic blood loss 07/10/2017  ? ? ?REFERRING DIAG: Cerebral Palsy  ? ?THERAPY DIAG:  ?Abnormal posture ? ?Muscle weakness (generalized) ? ?Other abnormalities of gait and mobility ? ?PERTINENT HISTORY: CP, Acid Reflux  ? ?PRECAUTIONS: Fall, B Hip Dislocation ? ?SUBJECTIVE: Patient reports feeling good, no new changes/complaints. No pain.   ? ?PAIN:  ?Are you having pain? No ? ?TOD[facility]'S TREATMENT:  ?[facility] completed core strengthening with use of dowel completed trunk rotation, completed trunk rotation to R/L to target 2 x 6 reps. Cues for breathing technique with activity. Intermittent rest break required. ? ?Completed activity working on improved forward lean with PT facilitating initially, then transitioned to patient leaning forward into PT resistance provided at anterior chest, completed x  10 reps. ? ?Seated without UE support and holding dowel with BUE support completed core activity with Pt hitting ball tossed to her by PT, supervision. Completed 2 x 10 reps with patient maintaining seated balance with activity. ?  ?Seated without back support and BLE placed on 6" step and floor pebble placed under foot, completed activation of pressing foot into pebble, completed 2 x 10 reps with 2-3 second hold.  ? ?Completed lateral sliding board transfer with short sliding board. Patient able to align board and position well, unable to get fully under L hip/buttocks due to lateral lean to use RUE.  Patient able to complete transfer from mat <> w/c, improved forward lean noted with PT facilitation for lateral movement. Mod A required.   ? ? ? ? ? ?PATIENT EDUCATION: ?Education details: Potential to hold future visit for Botox.  ?Person educated: Patient ?Education method: Explanation ?Education comprehension: verbalized understanding ? ? ?HOME EXERCISE PROGRAM: ?Access Code: [email or web address] ? ? PT Short Term Goals -  02/13/22 1335   ? ?  ? PT SHORT TERM GOAL #1  ? Title Pt will be able to verbalize understanding of techniques to improve proper positioning of BLE and stretching program with assistance (ALL STGs Due at 5th Visit)   ? Baseline no HEP established; verbalize understanding   ? Time 4   ? Period --   Visits  ? Status Achieved  ?  ? PT SHORT TERM GOAL #2  ? Title Patient will be able to stand in [place] for >/= 3 minute to demo improved activity and standing tolerance   ? Baseline TBA; 3 mins, 40 seconds on 2/27  ? Time 4   ? Period --   visits  ? Status Achieved  ?  ? PT SHORT TERM GOAL #3  ? Title Pt will be able to sit EOB without UE support for >/= 30 seconds to demo improved static seated balance   ? Baseline 15 seconds; able to complete without UE support 1-2 minute, approx 30-45 seconds with dynamic balance.  ? Time 4   ? Period --   visits  ? Status Achieved  ? ?  ?  ? ?  ? ? ? PT Long Term Goals - 02/13/22 1338   ? ?  ? PT LONG TERM GOAL #1  ? Title Pt will improve static seated balance without UE support to >/= 45 seconds to demo improved static seated tolerance for ADL's (ALL LTGs Due on 8th Visit)   ? Baseline 15 seconds   ? Time 8   ? Period --   visits  ? Status New   ?  ? PT LONG TERM GOAL #2  ? Title Pt will be able to perform slideboard transfer with mod A for improved ability to participate in transfers and independence.   ? Baseline Max A   ? Time 8   ? Period --   visits  ? Status New   ?  ? PT LONG TERM GOAL #3  ? Title Patient will demo ability to stand in Standing Frame for >/= 5 minutes for improved functional strength/ROM   ? Baseline TBA, 3 mins 45 seconds at prior POC   ? Time 8   ? Period Weeks   ? Status New   ?  ? PT LONG TERM GOAL #4  ? Title Patient will improve Knee Extension PROM by 5 degress bilaterally   ? Baseline -31 on LLE, -24 on RLE   ? Time 8   ? Period --   visits  ? Status New   ? ?  ?  ? ?  ? ? ? Plan - 03/04/22 1539   ? ? Clinical Impression Statement Continued dynamic core  activities today at [facility] without back support, patient tolerating well. Continued sliding board transfer, however utilized short sliding with improvements noted in positioning. Patient will continue to benefit from skilled PT services.   ? Personal Factors and Comorbidities Comorbidity 2;Time since onset of injury/illness/exacerbation;Transportation   ? Comorbidities CP, Acid Reflux   ? Examination-Activity Limitations Bathing;Bed Mobility;Toileting;Transfers;Reach Overhead;Stand;Sit   ? Examination-Participation Restrictions  Interpersonal Relationship;Community Activity;Occupation;Meal Prep;School   ? Stability/Clinical Decision Making Stable/Uncomplicated   ? Rehab Potential Fair   ? PT Frequency 1x / week   ? PT Duration 8 weeks   plus eval  ? PT Treatment/Interventions ADLs/Self Care Home Management;Cryotherapy;Electrical Stimulation;Moist Heat;DME Instruction;Gait training;Functional mobility training;Therapeutic activities;Therapeutic exercise;Balance training;Neuromuscular re-education;Patient/family education;Wheelchair mobility training;Manual techniques;Vestibular;Joint Manipulations;Spinal Manipulations;Passive range of motion   ? PT Next Visit Plan continue transfer training; BLE stretching; standing tolerance in standing frame   ? Consulted and Agree with Plan of Care Patient   ? ?  ?  ? ?  ? ? ? ? ?Tempie Donning, PT, DPT ?03/18/2022, 3:56 PM ? ?  ? ?

## 2022-03-25 ENCOUNTER — Encounter: Payer: Self-pay | Admitting: Occupational Therapy

## 2022-03-25 ENCOUNTER — Ambulatory Visit: Payer: Medicaid Other

## 2022-03-25 ENCOUNTER — Ambulatory Visit: Payer: Medicaid Other | Admitting: Occupational Therapy

## 2022-03-25 ENCOUNTER — Other Ambulatory Visit: Payer: Self-pay

## 2022-03-25 DIAGNOSIS — G800 Spastic quadriplegic cerebral palsy: Secondary | ICD-10-CM

## 2022-03-25 DIAGNOSIS — R293 Abnormal posture: Secondary | ICD-10-CM

## 2022-03-25 DIAGNOSIS — M6281 Muscle weakness (generalized): Secondary | ICD-10-CM

## 2022-03-25 DIAGNOSIS — R2689 Other abnormalities of gait and mobility: Secondary | ICD-10-CM

## 2022-03-25 DIAGNOSIS — R29818 Other symptoms and signs involving the nervous system: Secondary | ICD-10-CM

## 2022-03-25 DIAGNOSIS — M25632 Stiffness of left wrist, not elsewhere classified: Secondary | ICD-10-CM

## 2022-03-25 DIAGNOSIS — M25642 Stiffness of left hand, not elsewhere classified: Secondary | ICD-10-CM

## 2022-03-25 NOTE — Therapy (Signed)
?OUTPATIENT PHYSICAL THERAPY TREATMENT NOTE ? ? ?Patient Name: Christine Gomez ?MRN: [id number] ?DOB:1986-08-29, 36 y.o., female ?Today's Date: 03/25/2022 ? ?PCP: Everardo Beals, NP ?REFERRING PROVIDER: Everardo Beals, NP ? ? PT End of Session - 03/25/22 1449   ? ? Visit Number 7   ? Number of Visits 9   ? Date for PT Re-Evaluation 04/11/22   ? Authorization Type Traditional MCD (VL: 27)   ? Authorization Time Period Approved for Initial 3 Visits from  02/18/22 - 03/10/22; awaiting additional authorization; [employer] has approved 5 PT visits 03/11/22 - 04/14/22   ? Authorization - Visit Number 6   ? Authorization - Number of Visits 9   ? PT Start Time [id number]   ? PT Stop Time 1530   ? PT Time Calculation (min) 43 min   ? Equipment Utilized During Treatment Gait belt   ? Activity Tolerance Patient tolerated treatment well   ? Behavior During Therapy [facility] for tasks assessed/performed   ? ?  ?  ? ?  ? ? ?Past Medical History:  ?Diagnosis Date  ? Acid reflux   ? Cerebral palsy ([place])   ? ?Past Surgical History:  ?Procedure Laterality Date  ? dorsal rhizotomy    ? back ligament looosening surgery  ? ESOPHAGOGASTRODUODENOSCOPY (EGD) WITH PROPOFOL N/A 10/08/2016  ? Procedure: ESOPHAGOGASTRODUODENOSCOPY (EGD) WITH PROPOFOL;  Surgeon: Arta Silence, MD;  Location: [facility];  Service: Endoscopy;  Laterality: N/A;  ? left arm surgery    ? ?Patient Active Problem List  ? Diagnosis Date Noted  ? Dysphagia 05/31/2021  ? Epigastric pain 05/31/2021  ? Gastroesophageal reflux disease 05/31/2021  ? Hematemesis 05/31/2021  ? Hiatal hernia 05/31/2021  ? Microcytic anemia 05/31/2021  ? Sagittal band rupture, extensor tendon, nontraumatic, left 05/01/2021  ? Cerebral palsy ([place]) 04/25/2020  ? 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 Other acquired deformity of other parts of limb 04/25/2020  ? 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 Unspecified deformity of  forearm, excluding fingers 04/25/2020  ? Urinary incontinence 04/25/2020  ? Iron deficiency anemia due to chronic blood loss 07/10/2017  ? ? ?REFERRING DIAG: Cerebral Palsy  ? ?THERAPY DIAG:  ?Abnormal posture ? ?Muscle weakness (generalized) ? ?Other abnormalities of gait and mobility ? ?PERTINENT HISTORY: CP, Acid Reflux  ? ?PRECAUTIONS: Fall, B Hip Dislocation ? 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 No new changes/complaints.  ? ?PAIN:  ?Are you having pain? No ? ?TODAY'S TREATMENT:  ? ?Completed standing in standing frame with focus on upright posture, PT facilitating for improved positioning and working on LE engagement, weight shift and standing tolerance. Patient able to stand for 3 minutes on first rep but require seated rest break due to back spasms, then with second rep completed 8 minutes 30 seconds with addition of the following exercises: glute squeezes with 2-3 seconds hold x 15 reps, then lateral weight shift x 10 reps to R/L with PT providing facilitation. Then needed rest break due to fatigue. Then on last rep, patient able to stand 5 minutes with Cues for upright posture and facilitation for positioning. Patient tolerating 16 mins total standing today. Intermittent spasms in BLE and low back require rest breaks as needed.  ? ? ? ?PATIENT EDUCATION: ?Education details: Continue HEP; benefits of stretching post Botox  ?Person educated: Patient ?Education method: Explanation ?Education comprehension: verbalized understanding ? ? ?HOME EXERCISE PROGRAM: ?Access Code: [email or web address] ? ? PT Short Term  Goals - 02/13/22 1335   ? ?  ? PT SHORT TERM GOAL #1  ? Title Pt will be able to verbalize understanding of techniques to improve proper positioning of BLE and stretching program with assistance (ALL STGs Due at 5th Visit)   ? Baseline no HEP established; verbalize understanding   ? Time 4   ? Period --   Visits  ? Status Achieved  ?  ? PT SHORT TERM GOAL #2  ? Title Patient will be able  to stand in [place] for >/= 3 minute to demo improved activity and standing tolerance   ? Baseline TBA; 3 mins, 40 seconds on 2/27  ? Time 4   ? Period --   visits  ? Status Achieved  ?  ? PT SHORT TERM GOAL #3  ? Title Pt will be able to sit EOB without UE support for >/= 30 seconds to demo improved static seated balance   ? Baseline 15 seconds; able to complete without UE support 1-2 minute, approx 30-45 seconds with dynamic balance.  ? Time 4   ? Period --   visits  ? Status Achieved  ? ?  ?  ? ?  ? ? ? PT Long Term Goals - 02/13/22 1338   ? ?  ? PT LONG TERM GOAL #1  ? Title Pt will improve static seated balance without UE support to >/= 45 seconds to demo improved static seated tolerance for ADL's (ALL LTGs Due on 8th Visit)   ? Baseline 15 seconds   ? Time 8   ? Period --   visits  ? Status New   ?  ? PT LONG TERM GOAL #2  ? Title Pt will be able to perform slideboard transfer with mod A for improved ability to participate in transfers and independence.   ? Baseline Max A   ? Time 8   ? Period --   visits  ? Status New   ?  ? PT LONG TERM GOAL #3  ? Title Patient will demo ability to stand in Standing Frame for >/= 5 minutes for improved functional strength/ROM   ? Baseline TBA, 3 mins 45 seconds at prior POC   ? Time 8   ? Period Weeks   ? Status New   ?  ? PT LONG TERM GOAL #4  ? Title Patient will improve Knee Extension PROM by 5 degress bilaterally   ? Baseline -31 on LLE, -24 on RLE   ? Time 8   ? Period --   visits  ? Status New   ? ?  ?  ? ?  ? ? ? Plan - 03/04/22 1539   ? ? Clinical Impression Statement Continued standing tolerance in standing frame today, with focus on proximal hip strengthening and weight shifting, as well as promoting improved upright posture. Patient having increased spasms in the low back and BLE. Patient will continue to benefit from skilled PT services.   ? Personal Factors and Comorbidities Comorbidity 2;Time since onset of injury/illness/exacerbation;Transportation   ?  Comorbidities CP, Acid Reflux   ? Examination-Activity Limitations Bathing;Bed Mobility;Toileting;Transfers;Reach Overhead;Stand;Sit   ? Examination-Participation Restrictions Interpersonal Relationship;Community Activity;Occupation;Meal Prep;School   ? Stability/Clinical Decision Making Stable/Uncomplicated   ? Rehab Potential Fair   ? PT Frequency 1x / week   ? PT Duration 8 weeks   plus eval  ? PT Treatment/Interventions ADLs/Self Care Home Management;Cryotherapy;Electrical Stimulation;Moist Heat;DME Instruction;Gait training;Functional mobility training;Therapeutic activities;Therapeutic exercise;Balance training;Neuromuscular re-education;Patient/family education;Wheelchair mobility training;Manual  techniques;Vestibular;Joint Manipulations;Spinal Manipulations;Passive range of motion   ? PT Next Visit Plan continue transfer training; BLE stretching; standing tolerance in standing frame   ? Consulted and Agree with Plan of Care Patient   ? ?  ?  ? ?  ? ? ? ? ?Jones Bales, PT, DPT ?03/25/2022, 4:16 PM ? ?  ? ?

## 2022-03-25 NOTE — Therapy (Signed)
?OUTPATIENT OCCUPATIONAL THERAPY TREATMENT NOTE ? ? ?Patient Name: Christine Gomez ?MRN: [id number] ?DOB:04-Jan-1986, 36 y.o., female ?Today's Date: 03/25/2022 ? ?PCP: Marva Panda, NP ?REFERRING PROVIDER: Marva Panda, NP ? ? OT End of Session - 03/25/22 1643   ? ? Visit Number 5   ? Number of Visits 9   ? Date for OT Re-Evaluation 05/04/22   ? Authorization Type traditional medicaid   ? Progress Note Due on Visit 10   ? OT Start Time 1400   ? OT Stop Time 1445   ? OT Time Calculation (min) 45 min   ? Activity Tolerance Patient tolerated treatment well   ? Behavior During Therapy [facility] for tasks assessed/performed   ? ?  ?  ? ?  ? ? ?Past Medical History:  ?Diagnosis Date  ? Acid reflux   ? Cerebral palsy (HCC)   ? ?Past Surgical History:  ?Procedure Laterality Date  ? dorsal rhizotomy    ? back ligament looosening surgery  ? ESOPHAGOGASTRODUODENOSCOPY (EGD) WITH PROPOFOL N/A 10/08/2016  ? Procedure: ESOPHAGOGASTRODUODENOSCOPY (EGD) WITH PROPOFOL;  Surgeon: Willis Modena, MD;  Location: [facility];  Service: Endoscopy;  Laterality: N/A;  ? left arm surgery    ? ?Patient Active Problem List  ? Diagnosis Date Noted  ? Dysphagia 05/31/2021  ? Epigastric pain 05/31/2021  ? Gastroesophageal reflux disease 05/31/2021  ? Hematemesis 05/31/2021  ? Hiatal hernia 05/31/2021  ? Microcytic anemia 05/31/2021  ? Sagittal band rupture, extensor tendon, nontraumatic, left 05/01/2021  ? Cerebral palsy (HCC) 04/25/2020  ? 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 Other acquired deformity of other parts of limb 04/25/2020  ? 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 Unspecified deformity of forearm, excluding fingers 04/25/2020  ? Urinary incontinence 04/25/2020  ? Iron deficiency anemia due to chronic blood loss 07/10/2017  ? ? ?ONSET DATE: 01/20/22  ? ?REFERRING DIAG: Cerebral Palsy  ? ?THERAPY DIAG:  ?Abnormal posture ? ?Muscle weakness  (generalized) ? ?Other abnormalities of gait and mobility ? ?Spastic quadriplegic cerebral palsy (HCC) ? ?Other symptoms and signs involving the nervous system ? ?Stiffness of left hand, not elsewhere classified ? ?Stiffness of left wrist, not elsewhere classified ? ? ?PERTINENT HISTORY: B Hips Dislocated  ? ?PRECAUTIONS: fall ? ?SUBJECTIVE: Working hard to buy a car ? ?PAIN:  ?Are you having pain? No ? ? ? ? ?OBJECTIVE:  ? ?TODAY'S TREATMENT:  ?Worked at table to address elbow range of motion.  Patient with 10* increase in elbow extension actively (to 135*) Patient with 152 degrees of passive elbow extension following [place].   ?Ultrasound to left elbow crease x 8 min continuous 3 mhz, 0.8w/cm2.  Patient with red, shiny skin at elbow crease, and one pin point skin tear following stretching.   ?Followed with passive then active assisted to digits for extension.  Then box and blocks test - patient has improved form 2 blocks to 6 blocks (LTG= 8 blocks)   ? ? ? ? ? ? ? OT Short Term Goals - 03/25/22 1648   ? ?  ? OT SHORT TERM GOAL #1  ? Title Patient will complete an HEP designed to improve range of motion in Left hand/elbow   ? Baseline No current HEP   ? Time 4   ? Period Weeks   ? Status On-going   ? Target Date 04/04/22   ?  ? OT SHORT TERM GOAL #2  ? Title Patient will  demonstrate ability to don/doff updated left hand splint and demo understanding of wearing schedule   ? Baseline does not have current hand splint s/p tendon transfer   ? Time 4   ? Period Weeks   ? Status On-going   ?  ? OT SHORT TERM GOAL #3  ? Title Patient will demonstrate ability to assist with pulling up/down pants over hips after tolieting or with daily dressing/undressing   ? Baseline Dependent   ? Time 4   ? Period Weeks   ? Status On-going   ?  ? OT SHORT TERM GOAL #4  ? Title Patient will explore rolling shower chair options to use with her lift system that will improve safety with shower transfers   ? Baseline caregivers lifting onto shower  seat   ? Time 4   ? Period Weeks   ? Status On-going   ? ?  ?  ? ?  ? ? ? OT Long Term Goals -   ? ?  ? OT LONG TERM GOAL #1  ? Title Patient will complete updated HEP to address grasp and pinch ability in LUE   ? Baseline No current HEP   ? Time 8   ? Period Weeks   ? Status On-going   ? Target Date 05/04/22   ?  ? OT LONG TERM GOAL #2  ? Title Patient will complete at least 8 box on box and blocks test (LUE)   ? Baseline 2 blocks   ? Time 8   ? Period Weeks   ? Status On-going   ?  ? OT LONG TERM GOAL #3  ? Title Patient will roll left/right to assist with donning of incontinence brief, or explore pull up type brief   ? Baseline dependent   ? Time 8   ? Period Weeks   ? Status On-going   ?  ? OT LONG TERM GOAL #4  ? Title Patient will unweight hips with min assist to aide with toilet hygiene   ? Baseline dependent   ? Time 8   ? Period Weeks   ? Status On-going   ?  ? OT LONG TERM GOAL #5  ? Title Patient will feed self finger foods with left hand after set up   ? Baseline Not using left hand for feeding   ? Time 8   ? Period Weeks   ? Status On-going   ? ?  ?  ? ?  ? ? ? Plan -   ? ? Clinical Impression Statement Patient working to purchase a [employer]iece[employer]   ? OT Occupational Profile and History Problem Focused Assessment - Including review of records relating to presenting problem   ? Occupational performance deficits (Please refer to evaluation for details): ADL's;IADL's   ? Body Structure / Function / Physical Skills ADL;Coordination;Endurance;GMC;UE functional use;Balance;Fascial restriction;Skin integrity;Pain;IADL;Flexibility;Decreased knowledge of use of DME;Body mechanics;Dexterity;FMC;Strength;Tone;ROM;Mobility;Edema   ? Rehab Potential Good   ? Clinical Decision Making Several treatment options, min-mod task modification necessary   ? Comorbidities Affecting Occupational Performance: May have comorbidities impacting occupational performance   ? Modification or Assistance to Complete Evaluation  No modification of  tasks or assist necessary to complete eval   ? OT Frequency 1x / week   ? OT Duration 8 weeks   ? OT Treatment/Interventions Self-care/ADL training;Electrical Stimulation;Therapeutic exercise;Aquatic Therapy;Moist Heat;Neuromuscular education;Splinting;Patient/family education;Balance training;Therapeutic activities;Functional Mobility Training;Fluidtherapy;Ultrasound;Contrast Bath;Cryotherapy;DME and/or AE instruction;Manual Therapy;Passive range of motion   ? Plan Problem solve LB dressing equipment,  positioning, including brief, [place] elbow- forearm, wrist, digit ROM left   ? Consulted and Agree with Plan of Care Patient   ? ?  ?  ? ?  ? ? ? ?Collier Salina, OT ?03/25/2022, 4:49 PM ? ?  ? ?  ?

## 2022-04-01 ENCOUNTER — Ambulatory Visit: Payer: Medicaid Other | Attending: *Deleted

## 2022-04-01 ENCOUNTER — Encounter: Payer: Self-pay | Admitting: Occupational Therapy

## 2022-04-01 ENCOUNTER — Ambulatory Visit: Payer: Medicaid Other | Admitting: Occupational Therapy

## 2022-04-01 DIAGNOSIS — M25632 Stiffness of left wrist, not elsewhere classified: Secondary | ICD-10-CM | POA: Insufficient documentation

## 2022-04-01 DIAGNOSIS — G800 Spastic quadriplegic cerebral palsy: Secondary | ICD-10-CM | POA: Diagnosis present

## 2022-04-01 DIAGNOSIS — M6281 Muscle weakness (generalized): Secondary | ICD-10-CM | POA: Diagnosis present

## 2022-04-01 DIAGNOSIS — R29818 Other symptoms and signs involving the nervous system: Secondary | ICD-10-CM

## 2022-04-01 DIAGNOSIS — R2689 Other abnormalities of gait and mobility: Secondary | ICD-10-CM | POA: Insufficient documentation

## 2022-04-01 DIAGNOSIS — M25642 Stiffness of left hand, not elsewhere classified: Secondary | ICD-10-CM | POA: Insufficient documentation

## 2022-04-01 DIAGNOSIS — R293 Abnormal posture: Secondary | ICD-10-CM | POA: Insufficient documentation

## 2022-04-01 NOTE — Therapy (Signed)
?OUTPATIENT OCCUPATIONAL THERAPY TREATMENT NOTE ? ? ?Patient Name: Christine Gomez ?MRN: [id number] ?DOB:[birth year], 36 y.o., female ?Today's Date: 04/01/2022 ? ?PCP: Everardo Beals, NP ?REFERRING PROVIDER: Everardo Beals, NP ? ? OT End of Session - 04/01/22 1746   ? ? Visit Number 6   ? Number of Visits 9   ? Date for OT Re-Evaluation 05/04/22   ? Authorization Type ccme approved 8 OT visits 03/12/22 - 05/06/22   ? Progress Note Due on Visit 10   ? OT Start Time 1530   ? OT Stop Time 1612   ? OT Time Calculation (min) 42 min   ? Activity Tolerance Patient tolerated treatment well   ? Behavior During Therapy --   lethargic, sleepy  ? ?  ?  ? ?  ? ? ?Past Medical History:  ?Diagnosis Date  ? Acid reflux   ? Cerebral palsy ([place])   ? ?Past Surgical History:  ?Procedure Laterality Date  ? dorsal rhizotomy    ? back ligament looosening surgery  ? ESOPHAGOGASTRODUODENOSCOPY (EGD) WITH PROPOFOL N/A 10/08/2016  ? Procedure: ESOPHAGOGASTRODUODENOSCOPY (EGD) WITH PROPOFOL;  Surgeon: Arta Silence, MD;  Location: [facility];  Service: Endoscopy;  Laterality: N/A;  ? left arm surgery    ? ?Patient Active Problem List  ? Diagnosis Date Noted  ? Dysphagia 05/31/2021  ? Epigastric pain 05/31/2021  ? Gastroesophageal reflux disease 05/31/2021  ? Hematemesis 05/31/2021  ? Hiatal hernia 05/31/2021  ? Microcytic anemia 05/31/2021  ? Sagittal band rupture, extensor tendon, nontraumatic, left 05/01/2021  ? Cerebral palsy ([place]) 04/25/2020  ? 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 Other acquired deformity of other parts of limb 04/25/2020  ? 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 Unspecified deformity of forearm, excluding fingers 04/25/2020  ? Urinary incontinence 04/25/2020  ? Iron deficiency anemia due to chronic blood loss 07/10/2017  ? ? ?ONSET DATE: 01/20/22  ? ?REFERRING DIAG: Cerebral Palsy  ? ?THERAPY DIAG:  ?Abnormal posture ? ?Muscle weakness  (generalized) ? ?Spastic quadriplegic cerebral palsy ([place]) ? ?Other symptoms and signs involving the nervous system ? ?Stiffness of left hand, not elsewhere classified ? ? ?PERTINENT HISTORY: B Hips Dislocated  ? ?PRECAUTIONS: fall ? ?SUBJECTIVE: Working hard to buy a car ? ?PAIN:  ?Are you having pain? No ? ? ? ? ?OBJECTIVE:  ? ?TODAY'S TREATMENT:  ?04/01/22:  Patient very sleepy this session.  OT session following PT session.  Addressing short and long term goals.  Patient educated in HEP to stretch left elbow and hand.  Patient able to return demonstrate.   ?Worked on self feeding goal - finger foods.  Patient initially unable to feed self - reach food into mouth, but with repetition following stretching able to feed self using left hand finger foods - brownie bites.  Researched options for rolling shower chair - patient will continue investigating.  Looking for chair with adequate support, seatbelt, and larger wheels to travel over small lip of shower.  Patient to report back next session.   ? ? ?03/25/22:  Worked at table to address elbow range of motion.  Patient with 10* increase in elbow extension actively (to 135*) Patient with 152 degrees of passive elbow extension following [place].   ?Ultrasound to left elbow crease x 8 min continuous 3 mhz, 0.8w/cm2.  Patient with red, shiny skin at elbow crease, and one pin point skin tear following stretching.   ?Followed with passive then active assisted  to digits for extension.  Then box and blocks test - patient has improved form 2 blocks to 6 blocks (LTG= 8 blocks)   ? ? ? ? ? ? ? OT Short Term Goals - 04/01/22 1550   ? ?  ? OT SHORT TERM GOAL #2  ? Title Patient will demonstrate ability to don/doff updated left hand splint and demo understanding of wearing schedule   ? Baseline does not have current hand splint s/p tendon transfer   ? Time 4   ? Period Weeks   ? Status Partially Met   ?  ? OT SHORT TERM GOAL #3  ? Title Patient will demonstrate ability to assist with  pulling up/down pants over hips after tolieting or with daily dressing/undressing   ? Baseline Dependent   ? Time 4   ? Status On-going   ?  ? OT SHORT TERM GOAL #4  ? Title Patient will explore rolling shower chair options to use with her lift system that will improve safety with shower transfers   ? Baseline caregivers lifting onto shower seat   ? Period Weeks   ? Status On-going   ? ?  ?  ? ?  ? ? ? OT Long Term Goals -   ? ?  ? OT LONG TERM GOAL #1  ? Title Patient will complete updated HEP to address grasp and pinch ability in LUE   ? Baseline No current HEP   ? Time 8   ? Period Weeks   ? Status On-going   ? Target Date 05/04/22   ?  ? OT LONG TERM GOAL #2  ? Title Patient will complete at least 8 box on box and blocks test (LUE)   ? Baseline 2 blocks   ? Time 8   ? Period Weeks   ? Status On-going   ?  ? OT LONG TERM GOAL #3  ? Title Patient will roll left/right to assist with donning of incontinence brief, or explore pull up type brief   ? Baseline dependent   ? Time 8   ? Period Weeks   ? Status On-going   ?  ? OT LONG TERM GOAL #4  ? Title Patient will unweight hips with min assist to aide with toilet hygiene   ? Baseline dependent   ? Time 8   ? Period Weeks   ? Status On-going   ?  ? OT LONG TERM GOAL #5  ? Title Patient will feed self finger foods with left hand after set up   ? Baseline Not using left hand for feeding   ? Time 8   ? Period Weeks   ? Status Achieved  ? ?  ?  ? ?  ? ? ? Plan -   ? ? Clinical Impression Statement Patient is making steady functional gains, and continues to benefit from Left hand splint.    ? OT Occupational Profile and History Problem Focused Assessment - Including review of records relating to presenting problem   ? Occupational performance deficits (Please refer to evaluation for details): ADL's;IADL's   ? Body Structure / Function / Physical Skills ADL;Coordination;Endurance;GMC;UE functional use;Balance;Fascial restriction;Skin integrity;Pain;IADL;Flexibility;Decreased  knowledge of use of DME;Body mechanics;Dexterity;FMC;Strength;Tone;ROM;Mobility;Edema   ? Rehab Potential Good   ? Clinical Decision Making Several treatment options, min-mod task modification necessary   ? Comorbidities Affecting Occupational Performance: May have comorbidities impacting occupational performance   ? Modification or Assistance to Complete Evaluation  No modification of tasks or  assist necessary to complete eval   ? OT Frequency 1x / week   ? OT Duration 8 weeks   ? OT Treatment/Interventions Self-care/ADL training;Electrical Stimulation;Therapeutic exercise;Aquatic Therapy;Moist Heat;Neuromuscular education;Splinting;Patient/family education;Balance training;Therapeutic activities;Functional Mobility Training;Fluidtherapy;Ultrasound;Contrast Bath;Cryotherapy;DME and/or AE instruction;Manual Therapy;Passive range of motion   ? Plan Bed mobility, Problem solve LB dressing equipment, positioning, including brief,   ? Consulted and Agree with Plan of Care Patient   ? ?  ?  ? ?  ? ? ? ?Mariah Milling, OT ?04/01/2022, 5:48 PM ? ?  ? ?  ?

## 2022-04-01 NOTE — Patient Instructions (Signed)
Stretch left elbow.   ?Place arm up onto hospital table elevated to about shoulder height.   ?Rest your humerus on the table on a pillow - and allow gravity to lengthen your arm - elbow extension.   ?You can finish the movement with your right hand if necessary ? ?Stretch left fingers. ?Grasp all four fingers with your right hand.   ?Stretch fingers straight - as straight as possible.   ?Hold for several seconds.   ? ?Complete both of these stretches several times daily to prevent tightness.   ?

## 2022-04-01 NOTE — Therapy (Signed)
?OUTPATIENT PHYSICAL THERAPY TREATMENT NOTE ? ? ?Patient Name: Christine Gomez ?MRN: [id number] ?DOB:1986/06/07, 36 y.o., female ?Today's Date: 04/01/2022 ? ?PCP: Marva Panda, NP ?REFERRING PROVIDER: Marva Panda, NP ? ? PT End of Session - 04/01/22 1450   ? ? Visit Number 8   ? Number of Visits 9   ? Date for PT Re-Evaluation 04/11/22   ? Authorization Type Traditional MCD (VL: 27)   ? Authorization Time Period Approved for Initial 3 Visits from  02/18/22 - 03/10/22; awaiting additional authorization; CCME has approved 5 PT visits 03/11/22 - 04/14/22   ? Authorization - Visit Number 7   ? Authorization - Number of Visits 9   ? PT Start Time 1447   ? PT Stop Time 1530   ? PT Time Calculation (min) 43 min   ? Equipment Utilized During Treatment Gait belt   ? Activity Tolerance Patient tolerated treatment well   ? Behavior During Therapy [facility] for tasks assessed/performed   ? ?  ?  ? ?  ? ? ?Past Medical History:  ?Diagnosis Date  ? Acid reflux   ? Cerebral palsy ([facility])   ? ?Past Surgical History:  ?Procedure Laterality Date  ? dorsal rhizotomy    ? back ligament looosening surgery  ? ESOPHAGOGASTRODUODENOSCOPY (EGD) WITH PROPOFOL N/A 10/08/2016  ? Procedure: ESOPHAGOGASTRODUODENOSCOPY (EGD) WITH PROPOFOL;  Surgeon: Willis Modena, MD;  Location: [facility];  Service: Endoscopy;  Laterality: N/A;  ? left arm surgery    ? ?Patient Active Problem List  ? Diagnosis Date Noted  ? Dysphagia 05/31/2021  ? Epigastric pain 05/31/2021  ? Gastroesophageal reflux disease 05/31/2021  ? Hematemesis 05/31/2021  ? Hiatal hernia 05/31/2021  ? Microcytic anemia 05/31/2021  ? Sagittal band rupture, extensor tendon, nontraumatic, left 05/01/2021  ? Cerebral palsy ([facility]) 04/25/2020  ? 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 Other acquired deformity of other parts of limb 04/25/2020  ? 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 Unspecified deformity of  forearm, excluding fingers 04/25/2020  ? Urinary incontinence 04/25/2020  ? Iron deficiency anemia due to chronic blood loss 07/10/2017  ? ? ?REFERRING DIAG: Cerebral Palsy  ? ?THERAPY DIAG:  ?Abnormal posture ? ?Muscle weakness (generalized) ? ?Other abnormalities of gait and mobility ? ?PERTINENT HISTORY: CP, Acid Reflux  ? ?PRECAUTIONS: Fall, B Hip Dislocation ? ?SUBJECTIVE: No new changes/complaints since last visit. No pain. Reports is sleepy.  ? ?PAIN:  ?Are you having pain? No ? ?TODAY'S TREATMENT:  ?TherEx: ? ?From power w/c completed SciFit with BLE and RUE. Patient require  max A to get BLE properly placed onto machine. Completed on Level 1.0 x 5 minutes with PT facilitating for full range. Increased spasm noted in L Hamstring region requiring intermittent rest. Patient able to demo improvements with extension on RLE, but minimal contraction/extension noted with LLE. Mild fatigue.  ? ?With patient seated in power w/c, PT completed manual stretching to BLE. Completed bilateral gastroc 3 x 30 seconds progressing into range, hamstring 3 x 30 seconds, and bilat hip adductors 2 x 45 seconds. More notable spasms and tone in LLE > RLE. Continued to progress into range with patient able to tolerate/demo improved range of RLE.  ? ?TherAct: ? ?From power w/c <> mat, worked on transfer training with short sliding board. Patient focused on lateral weight shift and placement with patient able to complete today with only cues required for angle of board for improved safety. Attempted  to complete lateral slide board. Patient require Max A today due to fatigue and spasms in LLE. Transitioned back into power w/c to complete stretching on BLE.  ? ? ? ?PATIENT EDUCATION: ?Education details: Planned d/c next week ?Person educated: Patient ?Education method: Explanation ?Education comprehension: verbalized understanding ? ? ?HOME EXERCISE PROGRAM: ?Access Code: [email or web address] ? ? PT Short Term Goals - 02/13/22 1335   ? ?  ? PT SHORT  TERM GOAL #1  ? Title Pt will be able to verbalize understanding of techniques to improve proper positioning of BLE and stretching program with assistance (ALL STGs Due at 5th Visit)   ? Baseline no HEP established; verbalize understanding   ? Time 4   ? Period --   Visits  ? Status Achieved  ?  ? PT SHORT TERM GOAL #2  ? Title Patient will be able to stand in [place] for >/= 3 minute to demo improved activity and standing tolerance   ? Baseline TBA; 3 mins, 40 seconds on 2/27  ? Time 4   ? Period --   visits  ? Status Achieved  ?  ? PT SHORT TERM GOAL #3  ? Title Pt will be able to sit EOB without UE support for >/= 30 seconds to demo improved static seated balance   ? Baseline 15 seconds; able to complete without UE support 1-2 minute, approx 30-45 seconds with dynamic balance.  ? Time 4   ? Period --   visits  ? Status Achieved  ? ?  ?  ? ?  ? ? ? PT Long Term Goals - 02/13/22 1338   ? ?  ? PT LONG TERM GOAL #1  ? Title Pt will improve static seated balance without UE support to >/= 45 seconds to demo improved static seated tolerance for ADL's (ALL LTGs Due on 8th Visit)   ? Baseline 15 seconds   ? Time 8   ? Period --   visits  ? Status New   ?  ? PT LONG TERM GOAL #2  ? Title Pt will be able to perform slideboard transfer with mod A for improved ability to participate in transfers and independence.   ? Baseline Max A   ? Time 8   ? Period --   visits  ? Status New   ?  ? PT LONG TERM GOAL #3  ? Title Patient will demo ability to stand in Standing Frame for >/= 5 minutes for improved functional strength/ROM   ? Baseline TBA, 3 mins 45 seconds at prior POC   ? Time 8   ? Period Weeks   ? Status New   ?  ? PT LONG TERM GOAL #4  ? Title Patient will improve Knee Extension PROM by 5 degress bilaterally   ? Baseline -31 on LLE, -24 on RLE   ? Time 8   ? Period --   visits  ? Status New   ? ?  ?  ? ?  ? ? ? Plan - 03/04/22 1539   ? ? Clinical Impression Statement Trialed SciFit with patient tolerating well, more  activation and extension noted with RLE > LLE. Increased fatigue and spasms today therefore increased assistance required with transfers. Will continue per POC.   ? Personal Factors and Comorbidities Comorbidity 2;Time since onset of injury/illness/exacerbation;Transportation   ? Comorbidities CP, Acid Reflux   ? Examination-Activity Limitations Bathing;Bed Mobility;Toileting;Transfers;Reach Overhead;Stand;Sit   ? Examination-Participation Restrictions Interpersonal Relationship;Community Activity;Occupation;Meal Prep;School   ?  Stability/Clinical Decision Making Stable/Uncomplicated   ? Rehab Potential Fair   ? PT Frequency 1x / week   ? PT Duration 8 weeks   plus eval  ? PT Treatment/Interventions ADLs/Self Care Home Management;Cryotherapy;Electrical Stimulation;Moist Heat;DME Instruction;Gait training;Functional mobility training;Therapeutic activities;Therapeutic exercise;Balance training;Neuromuscular re-education;Patient/family education;Wheelchair mobility training;Manual techniques;Vestibular;Joint Manipulations;Spinal Manipulations;Passive range of motion   ? PT Next Visit Plan Check Goals + D/c? continue transfer training; BLE stretching; standing tolerance in standing frame   ? Consulted and Agree with Plan of Care Patient   ? ?  ?  ? ?  ? ? ? ? ?Tempie Donning, PT, DPT ?04/01/2022, 6:10 PM ? ?  ? ?

## 2022-04-08 ENCOUNTER — Ambulatory Visit: Payer: Medicaid Other | Admitting: Occupational Therapy

## 2022-04-08 ENCOUNTER — Ambulatory Visit: Payer: Medicaid Other

## 2022-04-08 ENCOUNTER — Encounter: Payer: Self-pay | Admitting: Occupational Therapy

## 2022-04-08 DIAGNOSIS — M25642 Stiffness of left hand, not elsewhere classified: Secondary | ICD-10-CM

## 2022-04-08 DIAGNOSIS — R293 Abnormal posture: Secondary | ICD-10-CM | POA: Diagnosis not present

## 2022-04-08 DIAGNOSIS — G800 Spastic quadriplegic cerebral palsy: Secondary | ICD-10-CM

## 2022-04-08 DIAGNOSIS — R29818 Other symptoms and signs involving the nervous system: Secondary | ICD-10-CM

## 2022-04-08 DIAGNOSIS — M6281 Muscle weakness (generalized): Secondary | ICD-10-CM

## 2022-04-08 DIAGNOSIS — M25632 Stiffness of left wrist, not elsewhere classified: Secondary | ICD-10-CM

## 2022-04-08 NOTE — Therapy (Signed)
?OUTPATIENT OCCUPATIONAL THERAPY TREATMENT NOTE ? ? ?Patient Name: Christine Gomez ?MRN: [id number] ?DOB:05-16-1986, 36 y.o., female ?Today's Date: 04/08/2022 ? ?PCP: Everardo Beals, NP ?REFERRING PROVIDER: Everardo Beals, NP ? ? OT End of Session - 04/08/22 1415   ? ? OT Start Time 1400   ? OT Stop Time 1430   ? OT Time Calculation (min) 30 min   ? Activity Tolerance Patient tolerated treatment well   ? Behavior During Therapy [facility] for tasks assessed/performed   ? ?  ?  ? ?  ? ? ?Past Medical History:  ?Diagnosis Date  ? Acid reflux   ? Cerebral palsy ([place])   ? ?Past Surgical History:  ?Procedure Laterality Date  ? dorsal rhizotomy    ? back ligament looosening surgery  ? ESOPHAGOGASTRODUODENOSCOPY (EGD) WITH PROPOFOL N/A 10/08/2016  ? Procedure: ESOPHAGOGASTRODUODENOSCOPY (EGD) WITH PROPOFOL;  Surgeon: Arta Silence, MD;  Location: [facility];  Service: Endoscopy;  Laterality: N/A;  ? left arm surgery    ? ?Patient Active Problem List  ? Diagnosis Date Noted  ? Dysphagia 05/31/2021  ? Epigastric pain 05/31/2021  ? Gastroesophageal reflux disease 05/31/2021  ? Hematemesis 05/31/2021  ? Hiatal hernia 05/31/2021  ? Microcytic anemia 05/31/2021  ? Sagittal band rupture, extensor tendon, nontraumatic, left 05/01/2021  ? Cerebral palsy ([place]) 04/25/2020  ? 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 Other acquired deformity of other parts of limb 04/25/2020  ? 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 Unspecified deformity of forearm, excluding fingers 04/25/2020  ? Urinary incontinence 04/25/2020  ? Iron deficiency anemia due to chronic blood loss 07/10/2017  ? ? ?ONSET DATE: 01/20/22  ? ?REFERRING DIAG: Cerebral Palsy  ? ?THERAPY DIAG:  ?Abnormal posture ? ?Muscle weakness (generalized) ? ?Spastic quadriplegic cerebral palsy ([place]) ? ?Other symptoms and signs involving the nervous system ? ?Stiffness of left hand, not elsewhere  classified ? ?Stiffness of left wrist, not elsewhere classified ? ? ?PERTINENT HISTORY: B Hips Dislocated  ? ?PRECAUTIONS: fall ? ?SUBJECTIVE: Working hard to buy a car ? ?PAIN:  ?Are you having pain? No ? ? ? ? ?OBJECTIVE:  ? ?TODAY'S TREATMENT: ? ?04/07/22:  Patient requesting appointment to end at 2:30 versus 2:45 due to appointment.  Patient had recent Botox injections (4/7) mostly in LLE, but also in left bicep.  ?Worked at tabletop and taught patient self range of motion exercise for left elbow and forearm.  Patient able to return demonstrate.  Patient with 155*elbow extension passively and 135* actively.   ? ? ?04/01/22:  Patient very sleepy this session.  OT session following PT session.  Addressing short and long term goals.  Patient educated in HEP to stretch left elbow and hand.  Patient able to return demonstrate.   ?Worked on self feeding goal - finger foods.  Patient initially unable to feed self - reach food into mouth, but with repetition following stretching able to feed self using left hand finger foods - brownie bites.  Researched options for rolling shower chair - patient will continue investigating.  Looking for chair with adequate support, seatbelt, and larger wheels to travel over small lip of shower.  Patient to report back next session.   ? ? ? ? ? ? ? ? ? OT Short Term Goals - 04/01/22 1550   ? ?  ? OT SHORT TERM GOAL #2  ? Title Patient will demonstrate ability to don/doff updated left hand splint and demo understanding  of wearing schedule   ? Baseline does not have current hand splint s/p tendon transfer   ? Time 4   ? Period Weeks   ? Status Partially Met   ?  ? OT SHORT TERM GOAL #3  ? Title Patient will demonstrate ability to assist with pulling up/down pants over hips after tolieting or with daily dressing/undressing   ? Baseline Dependent   ? Time 4   ? Status On-going   ?  ? OT SHORT TERM GOAL #4  ? Title Patient will explore rolling shower chair options to use with her lift system  that will improve safety with shower transfers   ? Baseline caregivers lifting onto shower seat   ? Period Weeks   ? Status Achieved  ? ?  ?  ? ?  ? ? ? OT Long Term Goals -   ? ?  ? OT LONG TERM GOAL #1  ? Title Patient will complete updated HEP to address grasp and pinch ability in LUE   ? Baseline No current HEP   ? Time 8   ? Period Weeks   ? Status On-going   ? Target Date 05/04/22   ?  ? OT LONG TERM GOAL #2  ? Title Patient will complete at least 8 box on box and blocks test (LUE)   ? Baseline 2 blocks   ? Time 8   ? Period Weeks   ? Status On-going   ?  ? OT LONG TERM GOAL #3  ? Title Patient will roll left/right to assist with donning of incontinence brief, or explore pull up type brief   ? Baseline dependent   ? Time 8   ? Period Weeks   ? Status On-going   ?  ? OT LONG TERM GOAL #4  ? Title Patient will unweight hips with min assist to aide with toilet hygiene   ? Baseline dependent   ? Time 8   ? Period Weeks   ? Status On-going   ?  ? OT LONG TERM GOAL #5  ? Title Patient will feed self finger foods with left hand after set up   ? Baseline Not using left hand for feeding   ? Time 8   ? Period Weeks   ? Status Achieved  ? ?  ?  ? ?  ? ? ? Plan -   ? ? Clinical Impression Statement Patient is making steady functional gains, and continues to benefit from Left hand splint.    ? OT Occupational Profile and History Problem Focused Assessment - Including review of records relating to presenting problem   ? Occupational performance deficits (Please refer to evaluation for details): ADL's;IADL's   ? Body Structure / Function / Physical Skills ADL;Coordination;Endurance;GMC;UE functional use;Balance;Fascial restriction;Skin integrity;Pain;IADL;Flexibility;Decreased knowledge of use of DME;Body mechanics;Dexterity;FMC;Strength;Tone;ROM;Mobility;Edema   ? Rehab Potential Good   ? Clinical Decision Making Several treatment options, min-mod task modification necessary   ? Comorbidities Affecting Occupational  Performance: May have comorbidities impacting occupational performance   ? Modification or Assistance to Complete Evaluation  No modification of tasks or assist necessary to complete eval   ? OT Frequency 1x / week   ? OT Duration 8 weeks   ? OT Treatment/Interventions Self-care/ADL training;Electrical Stimulation;Therapeutic exercise;Aquatic Therapy;Moist Heat;Neuromuscular education;Splinting;Patient/family education;Balance training;Therapeutic activities;Functional Mobility Training;Fluidtherapy;Ultrasound;Contrast Bath;Cryotherapy;DME and/or AE instruction;Manual Therapy;Passive range of motion   ? Plan Box and blocks, mat work - LB dressing / incont brief  ? Consulted and Agree with Plan  of Care Patient   ? ?  ?  ? ?  ? ? ? ?Mariah Milling, OT ?04/08/2022, 2:34 PM ? ?  ? ?  ?

## 2022-04-15 ENCOUNTER — Ambulatory Visit: Payer: Medicaid Other

## 2022-04-15 ENCOUNTER — Ambulatory Visit: Payer: Medicaid Other | Admitting: Occupational Therapy

## 2022-04-22 ENCOUNTER — Encounter: Payer: Self-pay | Admitting: Occupational Therapy

## 2022-04-22 ENCOUNTER — Ambulatory Visit: Payer: Medicaid Other | Admitting: Occupational Therapy

## 2022-04-22 DIAGNOSIS — M25632 Stiffness of left wrist, not elsewhere classified: Secondary | ICD-10-CM

## 2022-04-22 DIAGNOSIS — R293 Abnormal posture: Secondary | ICD-10-CM

## 2022-04-22 DIAGNOSIS — G800 Spastic quadriplegic cerebral palsy: Secondary | ICD-10-CM

## 2022-04-22 DIAGNOSIS — M25642 Stiffness of left hand, not elsewhere classified: Secondary | ICD-10-CM

## 2022-04-22 DIAGNOSIS — M6281 Muscle weakness (generalized): Secondary | ICD-10-CM

## 2022-04-22 NOTE — Therapy (Signed)
?OUTPATIENT OCCUPATIONAL THERAPY TREATMENT NOTE AND DISCHARGE SUMMARY ? ? ?Patient Name: Christine Gomez ?MRN: [id number] ?DOB:[birth year], 36 y.o., female ?Today's Date: 04/22/2022 ? ?PCP: Everardo Beals, NP ?REFERRING PROVIDER: Everardo Beals, NP ? ? OT End of Session - 04/22/22 1732   ? ? Visit Number 8   ? Number of Visits 9   ? Date for OT Re-Evaluation 05/04/22   ? Authorization Type ccme approved 8 OT visits 03/12/22 - 05/06/22   ? Progress Note Due on Visit 10   ? OT Start Time 1410   ? OT Stop Time 5956   ? OT Time Calculation (min) 35 min   ? Activity Tolerance Patient tolerated treatment well   ? Behavior During Therapy [facility] for tasks assessed/performed   ? ?  ?  ? ?  ? ? ?Past Medical History:  ?Diagnosis Date  ? Acid reflux   ? Cerebral palsy ([place])   ? ?Past Surgical History:  ?Procedure Laterality Date  ? dorsal rhizotomy    ? back ligament looosening surgery  ? ESOPHAGOGASTRODUODENOSCOPY (EGD) WITH PROPOFOL N/A 10/08/2016  ? Procedure: ESOPHAGOGASTRODUODENOSCOPY (EGD) WITH PROPOFOL;  Surgeon: Arta Silence, MD;  Location: [facility];  Service: Endoscopy;  Laterality: N/A;  ? left arm surgery    ? ?Patient Active Problem List  ? Diagnosis Date Noted  ? Dysphagia 05/31/2021  ? Epigastric pain 05/31/2021  ? Gastroesophageal reflux disease 05/31/2021  ? Hematemesis 05/31/2021  ? Hiatal hernia 05/31/2021  ? Microcytic anemia 05/31/2021  ? Sagittal band rupture, extensor tendon, nontraumatic, left 05/01/2021  ? Cerebral palsy ([place]) 04/25/2020  ? 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 Other acquired deformity of other parts of limb 04/25/2020  ? 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 Unspecified deformity of forearm, excluding fingers 04/25/2020  ? Urinary incontinence 04/25/2020  ? Iron deficiency anemia due to chronic blood loss 07/10/2017  ? ? ?ONSET DATE: 01/20/22  ? ?REFERRING DIAG: Cerebral Palsy  ? ?THERAPY DIAG:   ?Abnormal posture ? ?Muscle weakness (generalized) ? ?Spastic quadriplegic cerebral palsy ([place]) ? ?Stiffness of left hand, not elsewhere classified ? ?Stiffness of left wrist, not elsewhere classified ? ? ?PERTINENT HISTORY: B Hips Dislocated  ? ?PRECAUTIONS: fall ? ?SUBJECTIVE: Working hard to buy a car ? ?PAIN:  ?Are you having pain? No ? ? ? ? ?OBJECTIVE:  ? ?TODAY'S TREATMENT: ? ?04/22/22: ?Patient missed last session due to severe spasm in RLE.  Patient has rarely cancelled any therapy session.  Patient has met most long term goals and is agreeable to OT discharge at this time and intends to return in the late summer/early fall following Botox.   ? ?04/07/22:  Patient requesting appointment to end at 2:30 versus 2:45 due to appointment.  Patient had recent Botox injections (4/7) mostly in LLE, but also in left bicep.  ?Worked at tabletop and taught patient self range of motion exercise for left elbow and forearm.  Patient able to return demonstrate.  Patient with 155*elbow extension passively and 135* actively.   ? ? ?04/01/22:  Patient very sleepy this session.  OT session following PT session.  Addressing short and long term goals.  Patient educated in HEP to stretch left elbow and hand.  Patient able to return demonstrate.   ?Worked on self feeding goal - finger foods.  Patient initially unable to feed self - reach food into mouth, but with repetition following stretching able to feed self using left hand  finger foods - brownie bites.  Researched options for rolling shower chair - patient will continue investigating.  Looking for chair with adequate support, seatbelt, and larger wheels to travel over small lip of shower.  Patient to report back next session.   ? ? ? ? ? ? ? ? ? OT Short Term Goals - 04/01/22 1550   ? ?  ? OT SHORT TERM GOAL #2  ? Title Patient will demonstrate ability to don/doff updated left hand splint and demo understanding of wearing schedule   ? Baseline does not have current hand  splint s/p tendon transfer   ? Time 4   ? Period Weeks   ? Status Partially Met   ?  ? OT SHORT TERM GOAL #3  ? Title Patient will demonstrate ability to assist with pulling up/down pants over hips after tolieting or with daily dressing/undressing   ? Baseline Dependent   ? Time 4   ? Status Partially met - can pull down on one side  ?  ? OT SHORT TERM GOAL #4  ? Title Patient will explore rolling shower chair options to use with her lift system that will improve safety with shower transfers   ? Baseline caregivers lifting onto shower seat   ? Period Weeks   ? Status Achieved  ? ?  ?  ? ?  ? ? ? OT Long Term Goals -   ? ?  ? OT LONG TERM GOAL #1  ? Title Patient will complete updated HEP to address grasp and pinch ability in LUE   ? Baseline No current HEP   ? Time 8   ? Period Weeks   ? Status Achieved  ? Target Date 05/04/22   ?  ? OT LONG TERM GOAL #2  ? Title Patient will complete at least 8 box on box and blocks test (LUE)   ? Baseline 2 blocks   ? Time 8   ? Period Weeks   ? Status Achieved  ?  ? OT LONG TERM GOAL #3  ? Title Patient will roll left/right to assist with donning of incontinence brief, or explore pull up type brief   ? Baseline dependent   ? Time 8   ? Period Weeks   ? Status Deferred  ?  ? OT LONG TERM GOAL #4  ? Title Patient will unweight hips with min assist to aide with toilet hygiene   ? Baseline dependent   ? Time 8   ? Period Weeks   ? Status Deferred  ?  ? OT LONG TERM GOAL #5  ? Title Patient will feed self finger foods with left hand after set up   ? Baseline Not using left hand for feeding   ? Time 8   ? Period Weeks   ? Status Achieved  ? ?  ?  ? ?  ? ? ? Plan -   ? ? Clinical Impression Statement Patient agreeable to discharge as planned  ? OT Occupational Profile and History Problem Focused Assessment - Including review of records relating to presenting problem   ? Occupational performance deficits (Please refer to evaluation for details): ADL's;IADL's   ? Body Structure / Function  / Physical Skills ADL;Coordination;Endurance;GMC;UE functional use;Balance;Fascial restriction;Skin integrity;Pain;IADL;Flexibility;Decreased knowledge of use of DME;Body mechanics;Dexterity;FMC;Strength;Tone;ROM;Mobility;Edema   ? Rehab Potential Good   ? Clinical Decision Making Several treatment options, min-mod task modification necessary   ? Comorbidities Affecting Occupational Performance: May have comorbidities impacting occupational performance   ?  Modification or Assistance to Complete Evaluation  No modification of tasks or assist necessary to complete eval   ? OT Frequency 1x / week   ? OT Duration 8 weeks   ? OT Treatment/Interventions Self-care/ADL training;Electrical Stimulation;Therapeutic exercise;Aquatic Therapy;Moist Heat;Neuromuscular education;Splinting;Patient/family education;Balance training;Therapeutic activities;Functional Mobility Training;Fluidtherapy;Ultrasound;Contrast Bath;Cryotherapy;DME and/or AE instruction;Manual Therapy;Passive range of motion   ? Plan D/c  ? Consulted and Agree with Plan of Care Patient   ? ?  ?  ? ?  ? ? ? ?Mariah Milling, OT ?04/22/2022, 5:32 PM ? ?  ? ?  ?

## 2022-04-29 ENCOUNTER — Encounter: Payer: Medicaid Other | Admitting: Occupational Therapy

## 2022-05-08 ENCOUNTER — Encounter: Payer: Medicaid Other | Admitting: Occupational Therapy

## 2022-05-15 ENCOUNTER — Encounter: Payer: Medicaid Other | Admitting: Occupational Therapy

## 2022-05-20 ENCOUNTER — Encounter: Payer: Medicaid Other | Admitting: Occupational Therapy

## 2022-05-27 ENCOUNTER — Encounter: Payer: Medicaid Other | Admitting: Occupational Therapy

## 2022-06-11 ENCOUNTER — Ambulatory Visit: Payer: Medicaid Other | Admitting: Podiatry

## 2022-06-20 ENCOUNTER — Ambulatory Visit: Payer: Medicaid Other | Admitting: Podiatry

## 2022-06-26 ENCOUNTER — Other Ambulatory Visit (HOSPITAL_COMMUNITY)
Admission: RE | Admit: 2022-06-26 | Discharge: 2022-06-26 | Disposition: A | Discharge status: Home / Self Care | Payer: Medicaid Other | Source: Ambulatory Visit | Attending: Family Medicine | Admitting: Family Medicine

## 2022-06-26 ENCOUNTER — Ambulatory Visit (INDEPENDENT_AMBULATORY_CARE_PROVIDER_SITE_OTHER): Payer: Medicaid Other | Admitting: Family Medicine

## 2022-06-26 ENCOUNTER — Encounter: Payer: Self-pay | Admitting: Family Medicine

## 2022-06-26 VITALS — BP 112/80 | HR 67 | Ht <= 58 in | Wt 185.0 lb

## 2022-06-26 DIAGNOSIS — Z124 Encounter for screening for malignant neoplasm of cervix: Secondary | ICD-10-CM | POA: Insufficient documentation

## 2022-06-26 DIAGNOSIS — Z0000 Encounter for general adult medical examination without abnormal findings: Secondary | ICD-10-CM

## 2022-06-26 NOTE — Progress Notes (Signed)
   GYNECOLOGY ANNUAL PREVENTATIVE CARE ENCOUNTER NOTE  Subjective:   Christine Gomez is a 36 y.o.  female here for a routine annual gynecologic exam.  Current complaints: none, here for first pap. Patient has CP and in wheelchair. Require full assist for pelvic exams. She is not sexually active yet.  Does desires to be a parent in the future.   Denies abnormal vaginal bleeding, discharge, pelvic pain, problems with intercourse or other gynecologic concerns.    Gynecologic History Patient's last menstrual period was 05/19/2022 (approximate). Contraception: none Last Pap: Never had.  Last mammogram: @41  will offer   Health Maintenance Due  Topic Date Due   COVID-19 Vaccine (1) Never done   HIV Screening  Never done   Hepatitis C Screening  Never done   TETANUS/TDAP  Never done   PAP SMEAR-Modifier  Never done    The following portions of the patient's history were reviewed and updated as appropriate: allergies, current medications, past family history, past medical history, past social history, past surgical history and problem list.  Review of Systems Pertinent items are noted in HPI.   Objective:  BP 112/80   Pulse 67   Ht 4\' 10"  (1.473 m)   Wt 185 lb (83.9 kg)   LMP 05/19/2022 (Approximate)   BMI 38.67 kg/m  CONSTITUTIONAL: Well-developed, well-nourished female in no acute distress.  HENT:  Normocephalic, atraumatic, External right and left ear normal. Oropharynx is clear and moist EYES:  No scleral icterus.  NECK: Normal range of motion, supple, no masses.  Normal thyroid.  SKIN: Skin is warm and dry. No rash noted. Not diaphoretic. No erythema. No pallor. NEUROLOGIC: Alert and oriented to person, place, and time. Normal reflexes, muscle tone coordination. No cranial nerve deficit noted. PSYCHIATRIC: Normal mood and affect. Normal behavior. Normal judgment and thought content. CARDIOVASCULAR: Normal heart rate noted, regular rhythm. 2+ distal pulses. RESPIRATORY: Effort and  breath sounds normal, no problems with respiration noted. BREASTS: Symmetric in size. No masses, skin changes, nipple drainage, or lymphadenopathy. ABDOMEN: Soft,  no distention noted.  No tenderness, rebound or guarding.  PELVIC: Normal appearing external genitalia; normal appearing vaginal mucosa and cervix.  No abnormal discharge noted.  Pap smear obtained.  Normal uterine size, no other palpable masses, no uterine or adnexal tenderness. Of noted we were unable to use the footrest due to her contractures.we placed her in knees to chest with two person support and I was able to see the cervix, although not fully visualized cervical   MUSCULOSKELETAL: abnormal ROM due to CP      Assessment and Plan:  1) Annual gynecologic examination with pap smear:  Will follow up results of pap smear and manage accordingly. Declined STI screen .  Routine preventative health maintenance measures emphasized.  2) Contraception counseling: Not yet sexually active. Emphasized use of condoms 100% of the time for STI prevention.  1. Cervical cancer screening - Cytology - PAP( Ramirez-Perez)  Please refer to After Visit Summary for other counseling recommendations.   Return in about 1 year (around 06/27/2023) for Yearly wellness exam.  05/21/2022, MD, MPH, ABFM Attending Physician Center for [facility]

## 2022-06-26 NOTE — Progress Notes (Signed)
Pt states that this is her first Pap

## 2022-06-28 ENCOUNTER — Encounter: Payer: Self-pay | Admitting: Family Medicine

## 2022-06-30 LAB — CYTOLOGY - PAP
Comment: NEGATIVE
Diagnosis: NEGATIVE
High risk HPV: NEGATIVE

## 2022-07-16 ENCOUNTER — Encounter: Payer: Self-pay | Admitting: Podiatry

## 2022-07-16 ENCOUNTER — Ambulatory Visit (INDEPENDENT_AMBULATORY_CARE_PROVIDER_SITE_OTHER): Payer: Medicaid Other | Admitting: Podiatry

## 2022-07-16 DIAGNOSIS — M79675 Pain in left toe(s): Secondary | ICD-10-CM | POA: Diagnosis not present

## 2022-07-16 DIAGNOSIS — B351 Tinea unguium: Secondary | ICD-10-CM

## 2022-07-23 NOTE — Progress Notes (Signed)
  Subjective:  Patient ID: Christine Gomez, female    DOB: Nov 12, 1986,  MRN: [id number]  Christine Gomez presents to clinic today for thick, elongated toenails b/l lower extremities which are tender when wearing enclosed shoe gear.  Patient has h/o CP.  New problem(s): Patient states left 5th digit is tender today.   PCP is Marva Panda, NP , and last visit was January, 2023.Marland Kitchen  Allergies  Allergen Reactions   Lactose Diarrhea   Milk ([place])    Nickel Itching and Swelling    Review of Systems: Negative except as noted in the HPI. Objective:   Vascular Examination: Vascular status intact b/l with palpable pedal pulses. Pedal hair present b/l. CFT immediate b/l. No pain with calf compression b/l. Skin temperature gradient WNL b/l. Dependent edema noted b/l LE.  Neurological Examination: Sensation grossly intact b/l with 10 gram monofilament. Vibratory sensation intact b/l.   Dermatological Examination: Pedal skin with normal turgor, texture and tone b/l. Toenails 1-5 b/l thick, discolored, elongated with subungual debris and pain on dorsal palpation. No hyperkeratotic lesions noted b/l.  Left  4th and 5th digit nailplates thickened with POPl. No erythema, no edema, no drainage, no fluctuance.  Musculoskeletal Examination: Noted disuse atrophy bilaterally. 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 Utilizes motorized chair for mobility assistance.  Radiographs: None   Assessment:   1. Pain due to onychomycosis of nail   2. Pain of toe of left foot    Plan:  Patient was evaluated and treated and all questions answered. Consent given for treatment as described below: -Patient was evaluated and treated. All patient's and/or POA's questions/concerns answered on today's visit. -We did discuss option of permanent removal of nailplates  of left 4th and  5th toes should it continue to remain symptomatic. She will see Dr. Lilian Kapur for evaluation and recommendations. -Mycotic toenails 1-5  bilaterally were debrided in length and girth with sterile nail nippers and dremel without incident. -Continue soft, supportive shoe gear daily. -Patient/POA to call should there be question/concern in the interim.  Return in about 3 months (around 10/16/2022).  Freddie Breech, DPM

## 2022-09-04 ENCOUNTER — Ambulatory Visit (INDEPENDENT_AMBULATORY_CARE_PROVIDER_SITE_OTHER): Payer: Medicaid Other | Admitting: Podiatry

## 2022-09-04 DIAGNOSIS — Z91199 Patient's noncompliance with other medical treatment and regimen due to unspecified reason: Secondary | ICD-10-CM

## 2022-09-07 NOTE — Progress Notes (Signed)
Patient was no-show for appointment today 

## 2022-09-25 ENCOUNTER — Ambulatory Visit: Payer: Medicaid Other | Attending: *Deleted | Admitting: Physical Therapy

## 2022-09-25 ENCOUNTER — Encounter: Payer: Self-pay | Admitting: Physical Therapy

## 2022-09-25 DIAGNOSIS — M6249 Contracture of muscle, multiple sites: Secondary | ICD-10-CM | POA: Diagnosis present

## 2022-09-25 DIAGNOSIS — M6281 Muscle weakness (generalized): Secondary | ICD-10-CM | POA: Insufficient documentation

## 2022-09-25 DIAGNOSIS — G800 Spastic quadriplegic cerebral palsy: Secondary | ICD-10-CM | POA: Insufficient documentation

## 2022-09-25 DIAGNOSIS — R293 Abnormal posture: Secondary | ICD-10-CM | POA: Diagnosis present

## 2022-09-25 DIAGNOSIS — R2689 Other abnormalities of gait and mobility: Secondary | ICD-10-CM | POA: Insufficient documentation

## 2022-09-25 NOTE — Addendum Note (Signed)
Addended by: Elease Etienne B on: 09/25/2022 06:08 PM   Modules accepted: Orders

## 2022-09-25 NOTE — Therapy (Addendum)
OUTPATIENT PHYSICAL THERAPY NEURO EVALUATION   Patient Name: Christine Gomez MRN: [id number] DOB:17-Jul-1986, 36 y.o., female Today's Date: 09/25/2022   PCP: Everardo Beals, NP REFERRING PROVIDER: Everardo Beals, NP    PT End of Session - 09/25/22 1459     Visit Number 1    Number of Visits 13   12+eval   Date for PT Re-Evaluation 12/26/22   pushed out due to scheduling   Authorization Type MEDICAID [place] ACCESS    PT Start Time 1450    PT Stop Time 1535    PT Time Calculation (min) 45 min    Equipment Utilized During Treatment Gait belt    Activity Tolerance Patient tolerated treatment well    Behavior During Therapy [facility] for tasks assessed/performed             Past Medical History:  Diagnosis Date   Acid reflux    Cerebral palsy ([place])    Past Surgical History:  Procedure Laterality Date   dorsal rhizotomy  1996   back ligament looosening surgery   ESOPHAGOGASTRODUODENOSCOPY (EGD) WITH PROPOFOL N/A 10/08/2016   Procedure: ESOPHAGOGASTRODUODENOSCOPY (EGD) WITH PROPOFOL;  Surgeon: Arta Silence, MD;  Location: [facility];  Service: Endoscopy;  Laterality: N/A;   left arm surgery  03/2021   Patient Active Problem List   Diagnosis Date Noted   Dysphagia 05/31/2021   Epigastric pain 05/31/2021   Gastroesophageal reflux disease 05/31/2021   Hematemesis 05/31/2021   Hiatal hernia 05/31/2021   Microcytic anemia 05/31/2021   Sagittal band rupture, extensor tendon, nontraumatic, left 05/01/2021   Cerebral palsy ([place]) 04/25/2020   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   Other acquired deformity of other parts of limb 04/25/2020   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   Unspecified deformity of forearm, excluding fingers 04/25/2020   Urinary incontinence 04/25/2020   Iron deficiency anemia due to chronic blood loss 07/10/2017    ONSET DATE: 09/18/2022  REFERRING DIAG: G80.9  (ICD-10-CM) - Cerebral palsy, unspecified   THERAPY DIAG:  Abnormal posture - Plan: PT plan of care cert/re-cert  Muscle weakness (generalized) - Plan: PT plan of care cert/re-cert  Spastic quadriplegic cerebral palsy ([place]) - Plan: PT plan of care cert/re-cert  Other abnormalities of gait and mobility - Plan: PT plan of care cert/re-cert  Contracture of muscle, multiple sites - Plan: PT plan of care cert/re-cert  Rationale for Evaluation and Treatment Rehabilitation  SUBJECTIVE:  SUBJECTIVE STATEMENT: "My goals are pretty much the same, I would like to work towards standing tolerance so I can take steps in the parallel bars.  I also want to keep working with the sliding board.  My friend that was helping me practice at home has moved to [place] so she can't help me anymore and I don't want to lose everything I have.  My caregiver doesn't like to stretch me." Pt accompanied by: self  PERTINENT HISTORY: CP, Acid Reflux  PAIN:  Are you having pain? No Her right hip flexor has excessive tightness.  PRECAUTIONS: Fall; B Hips Dislocated   WEIGHT BEARING RESTRICTIONS No  FALLS: Has patient fallen in last 6 months? No  LIVING ENVIRONMENT: Lives with:  2 roommates-they do not help out with her care Lives in: House/apartment Stairs: No Has following equipment at home: Wheelchair (power), shower chair, bed side commode, and [place] lift, handheld shower  PLOF: Requires assistive device for independence, Needs assistance with ADLs, Needs assistance with homemaking, and Needs assistance with transfers  Aide present x3 hours in morning and vocational rehab present x4 hours in the evening every day.  PATIENT GOALS "To work towards standing and being able to wipe myself in the freedom bridge  for personal hygiene independence."  OBJECTIVE:   DIAGNOSTIC FINDINGS: No recent relevant findings.  COGNITION: Overall cognitive status: Within functional limits for tasks assessed   SENSATION: Light touch: WFL  COORDINATION: Not formally assessed.  EDEMA:  Moderate non-pitting edema noted on dorsal aspect of bilateral feet.  MUSCLE TONE: Present bilaterally, difficult to fully assess due to contractures.  Left appears worse than right. She receives botox every 3 months in LUE and BLE, received it in the right hip flexor for first time on last visit.  POSTURE: rounded shoulders and forward head- pt pelvis positioned well in custom PWC.  LOWER EXTREMITY ROM (measured in sitting today):     Passive  Right Eval Left Eval  Hip flexion    Hip extension    Hip abduction    Hip adduction    Hip internal rotation    Hip external rotation    Knee flexion    Knee extension Lacking 22 deg Lacking 32 deg  Ankle dorsiflexion    Ankle plantarflexion    Ankle inversion    Ankle eversion     (Blank rows = not tested)  LOWER EXTREMITY MMT:    MMT Right Eval Left Eval  Hip flexion 2-/5 2-/5  Hip extension    Hip abduction    Hip adduction    Hip internal rotation    Hip external rotation    Knee flexion 2-/5 2+/5  Knee extension 1+/5 1+/5  Ankle dorsiflexion 2-/5 1+/5  Ankle plantarflexion    Ankle inversion    Ankle eversion    (Blank rows = not tested)  BED MOBILITY:  Sit to supine Max A Supine to sit Max A Rolling to Right Mod A and Max A Rolling to Left Complete Independence  TRANSFERS: Assistive device utilized:  lift and tilt function of chair and [place] lift-pt reports from home function.   Sit to stand: Total A Stand to sit: Total A Chair to chair: Total A and freedom bridge lift  FUNCTIONAL TESTs:  FIST to be assessed.  PATIENT SURVEYS:  None completed.  TODAY'S TREATMENT:  N/A   PATIENT EDUCATION: Education details: Discussed  Medicaid visits remaining and frequency to maximize function, discussed taking current shower chair (too small)  to medical supply store for potential trade, edu on insertable bed rails and purchase options OOP, PT POC, assessments to be conducted and used today, and goals to be set. Person educated: Patient Education method: Explanation Education comprehension: verbalized understanding   HOME EXERCISE PROGRAM: N/A    GOALS: Goals reviewed with patient? Yes  SHORT TERM GOALS: Target date: 10/24/2022  Pt will be independent with instruction of caregiver for assisted LE PROM and core strength HEP. Baseline:  To be established. Goal status: INITIAL  2.  Sliding board goal to be set. Baseline:  To be assessed. Goal status: INITIAL  3.  Supine contracture goal to be set for quality of life and to progress towards standing. Baseline: To be further assessed. Goal status: INITIAL  4.  Pt will stand using Clarise Cruz stedy >/=30 sec in order to promote decreased contractures from prolonged sitting. Baseline: To be initiated. Goal status: INITIAL  5.  FIST to be assessed w/ goal set as appropriate. Baseline: To be assessed. Goal status: INITIAL  LONG TERM GOALS: Target date: 11/21/2022 (12 wks-12/19/2022)  FIST to be assessed w/ goal set as appropriate. Baseline: To be assessed. Goal status: INITIAL  2.  Contractures to be re-assessed in supine w/ goal set to promote quality of life, positioning for ease of caregiving, and promote improved independence w/ transfers. Baseline: To be re-assessed. Goal status: INITIAL  3.  Sliding board goal to be set. Baseline: To be assessed. Goal status: INITIAL  4.  Pt will stand in Denna Haggard >/=73mins to promote functional weight-bearing and strengthening of the BLE. Baseline: Unable to stand. Goal status: INITIAL  5.  PT will provide list of social worker phone numbers to assist pt in finding alternative housing solutions to promote functional  independence. Baseline: To be provided. Goal status: INITIAL  6.  Pt will report compliance from caregiver to PROM and stretching HEP of the BLE. Baseline: Caregiver currently not compliant per pt report. Goal status: INITIAL  ASSESSMENT:  CLINICAL IMPRESSION: Patient is a 36 y.o. female who was seen today for physical therapy evaluation and treatment for chronic deficits related to CP.  Pt has a significant PMH of spastic quadriplegic CP.  Identified impairments include significant upper and lower extremity contracture and hypertonicity left worse than right, dependence on caregiver/AD/DME for most all transfers and mobility, and generalized weakness of the LUE and BLE further limiting ROM.  Will use the FIST to further assess sitting balance, core strength and dependence level next session.  She would benefit from skilled PT to address impairments as noted and progress towards long term goals.  OBJECTIVE IMPAIRMENTS decreased balance, decreased mobility, difficulty walking, decreased ROM, decreased strength, hypomobility, increased edema, impaired flexibility, impaired tone, impaired UE functional use, and postural dysfunction.   ACTIVITY LIMITATIONS carrying, lifting, bending, standing, squatting, stairs, transfers, bed mobility, bathing, toileting, dressing, reach over head, hygiene/grooming, locomotion level, and caring for others  PARTICIPATION LIMITATIONS: meal prep, cleaning, laundry, driving, community activity, and occupation  [place], Past/current experiences, Time since onset of injury/illness/exacerbation, Transportation, and 1 comorbidity: CP  are also affecting patient's functional outcome.   REHAB POTENTIAL: Good  CLINICAL DECISION MAKING: Stable/uncomplicated  EVALUATION COMPLEXITY: Low  PLAN: PT FREQUENCY: 1x/week  PT DURATION: 12 weeks  PLANNED INTERVENTIONS: Therapeutic exercises, Therapeutic activity, Neuromuscular re-education, Balance training,  Gait training, Patient/Family education, Self Care, Joint mobilization, Vestibular training, Orthotic/Fit training, DME instructions, Manual therapy, and Re-evaluation  PLAN FOR NEXT SESSION: Assess FIST, remeasure supine contractures of  hip and knees bilaterally, and assess sliding board assist and return to sit EOM-set goals.  Initiate HEP-stretching PROM w/ caregiver assist-PT instructed pt to invite caregiver to sessions on eval to promote safety and compliance.  Managed medicaid CPT codes: [phone] - PT Re-evaluation, [phone]- Therapeutic Exercise, [phone]- Neuro Re-education, [phone] - Gait Training, [phone] - Manual Therapy, [phone] - Therapeutic Activities, [phone] - Self Care, and [phone] - Orthotic Fit   Bary Richard, PT, DPT 09/25/2022, 6:00 PM

## 2022-09-30 ENCOUNTER — Ambulatory Visit (INDEPENDENT_AMBULATORY_CARE_PROVIDER_SITE_OTHER): Payer: Medicaid Other | Admitting: Podiatry

## 2022-09-30 DIAGNOSIS — B351 Tinea unguium: Secondary | ICD-10-CM | POA: Diagnosis not present

## 2022-09-30 DIAGNOSIS — M79609 Pain in unspecified limb: Secondary | ICD-10-CM | POA: Diagnosis not present

## 2022-09-30 MED ORDER — NEOMYCIN-POLYMYXIN-HC 1 % OT SOLN: OTIC | 0 refills | Status: AC

## 2022-09-30 NOTE — Patient Instructions (Signed)

## 2022-10-01 ENCOUNTER — Ambulatory Visit: Payer: Medicaid Other | Attending: *Deleted | Admitting: Physical Therapy

## 2022-10-01 DIAGNOSIS — G800 Spastic quadriplegic cerebral palsy: Secondary | ICD-10-CM | POA: Diagnosis present

## 2022-10-01 DIAGNOSIS — R2689 Other abnormalities of gait and mobility: Secondary | ICD-10-CM | POA: Insufficient documentation

## 2022-10-01 DIAGNOSIS — M6249 Contracture of muscle, multiple sites: Secondary | ICD-10-CM | POA: Diagnosis present

## 2022-10-01 DIAGNOSIS — M6281 Muscle weakness (generalized): Secondary | ICD-10-CM | POA: Insufficient documentation

## 2022-10-01 DIAGNOSIS — R293 Abnormal posture: Secondary | ICD-10-CM | POA: Diagnosis present

## 2022-10-01 NOTE — Therapy (Signed)
OUTPATIENT PHYSICAL THERAPY NEURO-TREATMENT   Patient Name: Christine Gomez MRN: [id number] DOB:1986/09/02, 36 y.o., female Today's Date: 10/01/2022   PCP: Everardo Beals, NP REFERRING PROVIDER: Everardo Beals, NP    PT End of Session - 10/01/22 1317     Visit Number 2    Number of Visits 13   12+eval   Date for PT Re-Evaluation 12/26/22   pushed out due to scheduling   Authorization Type MEDICAID [place] ACCESS    PT Start Time 1315    PT Stop Time 1400    PT Time Calculation (min) 45 min    Equipment Utilized During Treatment Gait belt    Activity Tolerance Patient tolerated treatment well    Behavior During Therapy WFL for tasks assessed/performed             Past Medical History:  Diagnosis Date   Acid reflux    Cerebral palsy ([place])    Past Surgical History:  Procedure Laterality Date   dorsal rhizotomy  1996   back ligament looosening surgery   ESOPHAGOGASTRODUODENOSCOPY (EGD) WITH PROPOFOL N/A 10/08/2016   Procedure: ESOPHAGOGASTRODUODENOSCOPY (EGD) WITH PROPOFOL;  Surgeon: Arta Silence, MD;  Location: [facility];  Service: Endoscopy;  Laterality: N/A;   left arm surgery  03/2021   Patient Active Problem List   Diagnosis Date Noted   Dysphagia 05/31/2021   Epigastric pain 05/31/2021   Gastroesophageal reflux disease 05/31/2021   Hematemesis 05/31/2021   Hiatal hernia 05/31/2021   Microcytic anemia 05/31/2021   Sagittal band rupture, extensor tendon, nontraumatic, left 05/01/2021   Cerebral palsy ([place]) 04/25/2020   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   Other acquired deformity of other parts of limb 04/25/2020   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   Unspecified deformity of forearm, excluding fingers 04/25/2020   Urinary incontinence 04/25/2020   Iron deficiency anemia due to chronic blood loss 07/10/2017    ONSET DATE: 09/18/2022  REFERRING DIAG: G80.9  (ICD-10-CM) - Cerebral palsy, unspecified   THERAPY DIAG:  Abnormal posture  Muscle weakness (generalized)  Spastic quadriplegic cerebral palsy (HCC)  Other abnormalities of gait and mobility  Contracture of muscle, multiple sites  Rationale for Evaluation and Treatment Rehabilitation  SUBJECTIVE:                                                                                                                                                                                              SUBJECTIVE STATEMENT: Pt reports that yesterday she has surgery on her L foot, had toenails removed from her 4th and 5th toes. Pt has  some soreness from this but no other complaints of pain.  Pt accompanied by: self  PERTINENT HISTORY: CP, Acid Reflux  PAIN:  Are you having pain? No Her right hip flexor has excessive tightness.  PRECAUTIONS: Fall; B Hips Dislocated   WEIGHT BEARING RESTRICTIONS No  FALLS: Has patient fallen in last 6 months? No  LIVING ENVIRONMENT: Lives with:  2 roommates-they do not help out with her care Lives in: House/apartment Stairs: No Has following equipment at home: Wheelchair (power), shower chair, bed side commode, and [place] lift, handheld shower  PLOF: Requires assistive device for independence, Needs assistance with ADLs, Needs assistance with homemaking, and Needs assistance with transfers  Aide present x3 hours in morning and vocational rehab present x4 hours in the evening every day.  PATIENT GOALS "To work towards standing and being able to wipe myself in [place] for personal hygiene independence."  OBJECTIVE:   LOWER EXTREMITY ROM (measured in sitting today):     Passive  Right Eval Left Eval  Hip flexion    Hip extension    Hip abduction    Hip adduction    Hip internal rotation    Hip external rotation    Knee flexion    Knee extension Lacking 22 deg Lacking 32 deg  Ankle dorsiflexion    Ankle plantarflexion    Ankle  inversion    Ankle eversion     (Blank rows = not tested)   TODAY'S TREATMENT:  *** FIST Slide board transfer PWC to mat table with +2 Sit to supine mod A for BLE management Supine to sit max A for BLE management and trunk elevation  Measured hip flexion contracture and knee extension contractures in supine position  Educated pt on performing HS stretch in supine with folded up pillow under LE and hip flexor stretch in prone with pillows under thighs if tolerated   PATIENT EDUCATION: Education details: current contractures of hips and knees, stretching for HS and hip flexors Person educated: Patient Education method: Explanation Education comprehension: verbalized understanding   HOME EXERCISE PROGRAM: Educated pt on how to perform supine HS prolonged stretch with pillow under ankles and prone hip flexor stretch if position tolerated. Can provide handout next session if needed.    GOALS: Goals reviewed with patient? Yes  SHORT TERM GOALS: Target date: 10/24/2022  Pt will be independent with instruction of caregiver for assisted LE PROM and core strength HEP. Baseline:  To be established. Goal status: INITIAL  2.  Sliding board goal to be set. Baseline:  To be assessed. Goal status: INITIAL  3.  Supine contracture goal to be set for quality of life and to progress towards standing. Baseline: To be further assessed. Goal status: INITIAL  4.  Pt will stand using Clarise Cruz stedy >/=30 sec in order to promote decreased contractures from prolonged sitting. Baseline: To be initiated. Goal status: INITIAL  5.  FIST to be assessed w/ goal set as appropriate. Baseline: To be assessed. Goal status: INITIAL  LONG TERM GOALS: Target date: 11/21/2022 (12 wks-12/19/2022)  FIST to be assessed w/ goal set as appropriate. Baseline: To be assessed. Goal status: INITIAL  2.  Contractures to be re-assessed in supine w/ goal set to promote quality of life, positioning for ease of  caregiving, and promote improved independence w/ transfers. Baseline: To be re-assessed. Goal status: INITIAL  3.  Sliding board goal to be set. Baseline: To be assessed. Goal status: INITIAL  4.  Pt will stand  in [place] >/=20mins to promote functional weight-bearing and strengthening of the BLE. Baseline: Unable to stand. Goal status: INITIAL  5.  PT will provide list of social worker phone numbers to assist pt in finding alternative housing solutions to promote functional independence. Baseline: To be provided. Goal status: INITIAL  6.  Pt will report compliance from caregiver to PROM and stretching HEP of the BLE. Baseline: Caregiver currently not compliant per pt report. Goal status: INITIAL  ASSESSMENT:  CLINICAL IMPRESSION: Emphasis of skilled PT session***  Grade FIST and set STG and LTG***  Pt and caregiver education for PROM Continue POC.   OBJECTIVE IMPAIRMENTS decreased balance, decreased mobility, difficulty walking, decreased ROM, decreased strength, hypomobility, increased edema, impaired flexibility, impaired tone, impaired UE functional use, and postural dysfunction.   ACTIVITY LIMITATIONS carrying, lifting, bending, standing, squatting, stairs, transfers, bed mobility, bathing, toileting, dressing, reach over head, hygiene/grooming, locomotion level, and caring for others  PARTICIPATION LIMITATIONS: meal prep, cleaning, laundry, driving, community activity, and occupation  [place], Past/current experiences, Time since onset of injury/illness/exacerbation, Transportation, and 1 comorbidity: CP  are also affecting patient's functional outcome.   REHAB POTENTIAL: Good  CLINICAL DECISION MAKING: Stable/uncomplicated  EVALUATION COMPLEXITY: Low  PLAN: PT FREQUENCY: 1x/week  PT DURATION: 12 weeks  PLANNED INTERVENTIONS: Therapeutic exercises, Therapeutic activity, Neuromuscular re-education, Balance training, Gait training,  Patient/Family education, Self Care, Joint mobilization, Vestibular training, Orthotic/Fit training, DME instructions, Manual therapy, and Re-evaluation  PLAN FOR NEXT SESSION: Assess FIST, remeasure supine contractures of hip and knees bilaterally, and assess sliding board assist and return to sit EOM-set goals.  Initiate HEP-stretching PROM w/ caregiver assist-PT instructed pt to invite caregiver to sessions on eval to promote safety and compliance.  Managed medicaid CPT codes: [phone] - PT Re-evaluation, [phone]- Therapeutic Exercise, [phone]- Neuro Re-education, [phone] - Gait Training, [phone] - Manual Therapy, [phone] - Therapeutic Activities, [phone] - Self Care, and [place]   Excell Seltzer, PT, DPT, CSRS 10/01/2022, 2:03 PM

## 2022-10-05 NOTE — Progress Notes (Signed)
  Subjective:  Patient ID: CONSEPCION UTT, female    DOB: October 13, 1986,  MRN: [id number]  Chief Complaint  Patient presents with   Nail Problem    Discuss removal of left 4th and 5th toenails    36 y.o. female presents with the above complaint. History confirmed with patient.  She has always had problems with his nails and they do not grow right.  Dr. Elisha Ponder recommended removal  Objective:  Physical Exam: warm, good capillary refill, no trophic changes or ulcerative lesions, normal DP and PT pulses, normal sensory exam, and dystrophic mycotic fourth and fifth toenails  Assessment:   1. Pain due to onychomycosis of nail      Plan:  Patient was evaluated and treated and all questions answered.  Discussed further treatment options of this.  I agree that there is likely not a good solution to make his toenails normal again.  We discussed total matricectomy.  She agreed and wished to proceed.  Following informed consent and prepped with Betadine and digital block with lidocaine and Marcaine the nail plates of the left fourth and fifth toes were removed with a Soil scientist and the nail matrix was destroyed using 3 applications of phenolic acid.  They were irrigated with alcohol and sterile bandages were applied.  Cortisporin drops sent to pharmacy.  Post care instructions given.  Return in about 4 weeks (around 10/28/2022) for nail check /nurse vist.

## 2022-10-08 ENCOUNTER — Encounter: Payer: Self-pay | Admitting: Physical Therapy

## 2022-10-08 ENCOUNTER — Ambulatory Visit: Payer: Medicaid Other | Admitting: Physical Therapy

## 2022-10-08 DIAGNOSIS — R293 Abnormal posture: Secondary | ICD-10-CM | POA: Diagnosis not present

## 2022-10-08 DIAGNOSIS — R2689 Other abnormalities of gait and mobility: Secondary | ICD-10-CM

## 2022-10-08 DIAGNOSIS — G800 Spastic quadriplegic cerebral palsy: Secondary | ICD-10-CM

## 2022-10-08 DIAGNOSIS — M6281 Muscle weakness (generalized): Secondary | ICD-10-CM

## 2022-10-08 NOTE — Therapy (Signed)
OUTPATIENT PHYSICAL THERAPY NEURO TREATMENT   Patient Name: Christine Gomez MRN: [id number] DOB:1986/06/16, 36 y.o., female Today's Date: 10/08/2022   PCP: Marva Panda, NP REFERRING PROVIDER: Marva Panda, NP    PT End of Session - 10/08/22 1450     Visit Number 3    Number of Visits 13   12+eval   Date for PT Re-Evaluation 12/26/22   pushed out due to scheduling   Authorization Type MEDICAID [place] ACCESS    PT Start Time 1447    PT Stop Time 1534    PT Time Calculation (min) 47 min    Equipment Utilized During Treatment Gait belt    Activity Tolerance Patient tolerated treatment well    Behavior During Therapy WFL for tasks assessed/performed             Past Medical History:  Diagnosis Date   Acid reflux    Cerebral palsy ([facility])    Past Surgical History:  Procedure Laterality Date   dorsal rhizotomy  1996   back ligament looosening surgery   ESOPHAGOGASTRODUODENOSCOPY (EGD) WITH PROPOFOL N/A 10/08/2016   Procedure: ESOPHAGOGASTRODUODENOSCOPY (EGD) WITH PROPOFOL;  Surgeon: Willis Modena, MD;  Location: [facility];  Service: Endoscopy;  Laterality: N/A;   left arm surgery  03/2021   Patient Active Problem List   Diagnosis Date Noted   Dysphagia 05/31/2021   Epigastric pain 05/31/2021   Gastroesophageal reflux disease 05/31/2021   Hematemesis 05/31/2021   Hiatal hernia 05/31/2021   Microcytic anemia 05/31/2021   Sagittal band rupture, extensor tendon, nontraumatic, left 05/01/2021   Cerebral palsy ([facility]) 04/25/2020   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   Other acquired deformity of other parts of limb 04/25/2020   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   Unspecified deformity of forearm, excluding fingers 04/25/2020   Urinary incontinence 04/25/2020   Iron deficiency anemia due to chronic blood loss 07/10/2017    ONSET DATE: 09/18/2022  REFERRING DIAG: G80.9  (ICD-10-CM) - Cerebral palsy, unspecified   THERAPY DIAG:  Abnormal posture  Muscle weakness (generalized)  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  She states no issues with the foot.  She is tired because  she went to a concert last night.  Pt accompanied by: self  PERTINENT HISTORY: CP, Acid Reflux  PAIN:  Are you having pain? Yes: NPRS scale: 5/10 Pain location: Right hip and low back Pain description: tightness Aggravating factors: cold, legs dangling Relieving factors: laying on stomach, stretching Right hip spasms and locks.  PRECAUTIONS: Fall; B Hips Dislocated   WEIGHT BEARING RESTRICTIONS No  FALLS: Has patient fallen in last 6 months? No  LIVING ENVIRONMENT: Lives with:  2 roommates-they do not help out with her care Lives in: House/apartment Stairs: No Has following equipment at home: Wheelchair (power), shower chair, bed side commode, and [place] lift, handheld shower  PLOF: Requires assistive device for independence, Needs assistance with ADLs, Needs assistance with homemaking, and Needs assistance with transfers  Aide present x3 hours in morning and vocational rehab present x4 hours in the evening every day.  PATIENT GOALS "To work towards standing and being able to wipe myself in [place] for personal hygiene independence."  OBJECTIVE:   LOWER EXTREMITY ROM (measured in sitting today):     Passive  Right Eval  (in sitting) Left Eval  (in sitting) Right 10/4 (in supine) Left 10/4  (in supine)  Hip flexion   24 deg contracture 31  degree contracture  Hip extension      Hip abduction      Hip adduction      Hip internal rotation      Hip external rotation      Knee flexion      Knee extension Lacking 22 deg Lacking 32 deg Lacking 20 deg Lacking 24 degrees  Ankle dorsiflexion      Ankle plantarflexion      Ankle inversion      Ankle eversion       (Blank rows = not tested)   TODAY'S TREATMENT:  Initiated session w/ sliding board transfers w/c <> mat w/ pt able to use RUE to pull when transferring to the right using several scoots w/ therapist minA to position feet midway through transfer and assist sliding bottom for small bursts.  She requires maxA-totalA to transfer to the left, but does demonstrate improved lean and push.  She is able to verbalize needed positioning modifications like leaning forward more and reposition of the LLE to allow her to push more.  Upon second sliding board transfer pt requires inc time w/ second therapist CGA in posterior positioning of pt to keep pt on track with board.  Transitioned to feet on 4" step to maintain functional LE push as able during completion of transfer back to mat.  Feet remained on 4" step for duration of session.  Pt practices scoots fwd/backwards x3 each direction w/ maxA from PT, unable to clear butt and demonstrates minimal translation, but good RUE engagement to task.  Pt does require cues and minA to further engage forward lean.   Pt practices forward and backward rocking on EOM achieving moderate ROM, cued to bring elbow close to knees to practice drastic forward weight shifting w/ mild improvement.  Pt practices lateral leans bilaterally w/ inc time to engage to task primarily on the left side, minA to return upright from left lean.  Performed x5 each side.  Final sliding board back to w/c from mat required maxA+2 to complete slide into PWC due to cushioning type and pt fatigue.  PATIENT EDUCATION: Education details: Continue HEP. Person educated:  Patient Education method: Explanation Education comprehension: verbalized understanding   HOME  EXERCISE PROGRAM: Educated pt on how to perform supine HS prolonged stretch with pillow under ankles and prone hip flexor stretch if position tolerated. Can provide handout next session if needed.    GOALS: Goals reviewed with patient? Yes  SHORT TERM GOALS: Target date: 10/24/2022  Pt will be independent with instruction of caregiver for assisted LE PROM and core strength HEP. Baseline:  To be established. Goal status: INITIAL  2.  Pt will perform slide board transfer with assist x 1 consistently. Baseline:  max to total A +2 Goal status: INITIAL  3.  Pt will decrease her B knee contractures to less than -15 degrees to increase her ability to stand Baseline: -20 (R)/-24 (L) Goal status: INITIAL  4.  Pt will stand using Clarise Cruz stedy >/=30 sec in order to promote decreased contractures from prolonged sitting. Baseline: To be initiated. Goal status: INITIAL  5.  Pt will increase her score on the FIST to 30/56 to demonstrate increased independence and function in a sitting position. Baseline: 24/56 (10/4) Goal status: INITIAL  LONG TERM GOALS: Target date: 11/21/2022 (12 wks-12/19/2022)  Pt will increase her score on the FIST to 35/56 to demonstrate increased independence and function in a sitting position. Baseline: 24/56 (10/4) Goal status: INITIAL  2.  Pt will decrease her hip flexion contractures to </= 20 degrees on the R and </=25 degrees on the L to improve her ability to stand Baseline: 24 (R)/ 31 (L) Goal status: INITIAL  3.  Pt will complete sliding board transfers with mod A x 1 consistently Baseline: max to total A +2 Goal status: INITIAL  4.  Pt will stand in [place] >/=70mins to promote functional weight-bearing and strengthening of the BLE. Baseline: Unable to stand. Goal status: INITIAL  5.  PT will provide list of social worker phone numbers to assist pt in  finding alternative housing solutions to promote functional independence. Baseline: To be provided. Goal status: INITIAL  6.  Pt will report compliance from caregiver to PROM and stretching HEP of the BLE. Baseline: Caregiver currently not compliant per pt report. Goal status: INITIAL  ASSESSMENT:  CLINICAL IMPRESSION: Focused of skilled session on addressing components of sliding board transfer in parts and as a whole.  Pt remains most challenged transferring to the left due to reliance on pulling technique to complete transfer and functional weakness of the left side.  Her level of assist waivers from minA traveling to the right to maxA +2 traveling to the left.  She requires increased time to engage to task and safe positioning of feet.  She continues to benefit from skilled PT to further addressed use of stander as pt continues to endorse wanting to purchase one of these for use with aide.   OBJECTIVE IMPAIRMENTS decreased balance, decreased mobility, difficulty walking, decreased ROM, decreased strength, hypomobility, increased edema, impaired flexibility, impaired tone, impaired UE functional use, and postural dysfunction.   ACTIVITY LIMITATIONS carrying, lifting, bending, standing, squatting, stairs, transfers, bed mobility, bathing, toileting, dressing, reach over head, hygiene/grooming, locomotion level, and caring for others  PARTICIPATION LIMITATIONS: meal prep, cleaning, laundry, driving, community activity, and occupation  [place], Past/current experiences, Time since onset of injury/illness/exacerbation, Transportation, and 1 comorbidity: CP  are also affecting patient's functional outcome.   REHAB POTENTIAL: Good  CLINICAL DECISION MAKING: Stable/uncomplicated  EVALUATION COMPLEXITY: Low  PLAN: PT FREQUENCY: 1x/week  PT DURATION: 12 weeks  PLANNED INTERVENTIONS: Therapeutic exercises, Therapeutic activity, Neuromuscular re-education, Balance training,  Gait training, Patient/Family  education, Self Care, Joint mobilization, Vestibular training, Orthotic/Fit training, DME instructions, Manual therapy, and Re-evaluation  PLAN FOR NEXT SESSION: work on slide board transfers or breaking down components (anterior and lateral leans, scooting laterally), review stretching for HS and hip flexors, standing in [employer]  Managed medicaid CPT codes: 93734 - PT Re-evaluation, 97110- Therapeutic Exercise, [phone]- Neuro Re-education, [phone] - Gait Training, [phone] - Manual Therapy, [phone] - Therapeutic Activities, [phone] - Self Care, and [phone] - Orthotic Fit   Sadie Haber, PT, DPT 10/08/2022, 4:58 PM

## 2022-10-14 ENCOUNTER — Encounter: Payer: Self-pay | Admitting: Podiatry

## 2022-10-14 ENCOUNTER — Telehealth: Payer: Self-pay | Admitting: Podiatry

## 2022-10-14 NOTE — Telephone Encounter (Signed)
lvm to cb to r/s appt for 10-27, 30 or [place]

## 2022-10-15 ENCOUNTER — Ambulatory Visit: Payer: Medicaid Other | Admitting: Physical Therapy

## 2022-10-21 ENCOUNTER — Ambulatory Visit: Payer: Medicaid Other | Admitting: Physical Therapy

## 2022-10-22 ENCOUNTER — Ambulatory Visit: Payer: Medicaid Other | Admitting: Podiatry

## 2022-10-24 ENCOUNTER — Ambulatory Visit: Payer: Medicaid Other | Admitting: Physical Therapy

## 2022-10-24 ENCOUNTER — Ambulatory Visit (INDEPENDENT_AMBULATORY_CARE_PROVIDER_SITE_OTHER): Payer: Medicaid Other | Admitting: Podiatry

## 2022-10-24 DIAGNOSIS — G800 Spastic quadriplegic cerebral palsy: Secondary | ICD-10-CM

## 2022-10-24 DIAGNOSIS — Z91199 Patient's noncompliance with other medical treatment and regimen due to unspecified reason: Secondary | ICD-10-CM

## 2022-10-24 DIAGNOSIS — M6281 Muscle weakness (generalized): Secondary | ICD-10-CM

## 2022-10-24 DIAGNOSIS — R293 Abnormal posture: Secondary | ICD-10-CM

## 2022-10-24 DIAGNOSIS — R2689 Other abnormalities of gait and mobility: Secondary | ICD-10-CM

## 2022-10-24 DIAGNOSIS — M6249 Contracture of muscle, multiple sites: Secondary | ICD-10-CM

## 2022-10-24 NOTE — Therapy (Signed)
OUTPATIENT PHYSICAL THERAPY NEURO TREATMENT   Patient Name: Christine Gomez MRN: [id number] DOB:1986/01/26, 36 y.o., female Today's Date: 10/24/2022   PCP: Everardo Beals, NP REFERRING PROVIDER: Everardo Beals, NP    PT End of Session - 10/24/22 1402     Visit Number 4    Number of Visits 13   12+eval   Date for PT Re-Evaluation 12/26/22   pushed out due to scheduling   Authorization Type MEDICAID [place] ACCESS    PT Start Time 1400    PT Stop Time 1449    PT Time Calculation (min) 49 min    Equipment Utilized During Treatment Gait belt    Activity Tolerance Patient tolerated treatment well    Behavior During Therapy WFL for tasks assessed/performed              Past Medical History:  Diagnosis Date   Acid reflux    Cerebral palsy ([place])    Past Surgical History:  Procedure Laterality Date   dorsal rhizotomy  1996   back ligament looosening surgery   ESOPHAGOGASTRODUODENOSCOPY (EGD) WITH PROPOFOL N/A 10/08/2016   Procedure: ESOPHAGOGASTRODUODENOSCOPY (EGD) WITH PROPOFOL;  Surgeon: Arta Silence, MD;  Location: [facility];  Service: Endoscopy;  Laterality: N/A;   left arm surgery  03/2021   Patient Active Problem List   Diagnosis Date Noted   Dysphagia 05/31/2021   Epigastric pain 05/31/2021   Gastroesophageal reflux disease 05/31/2021   Hematemesis 05/31/2021   Hiatal hernia 05/31/2021   Microcytic anemia 05/31/2021   Sagittal band rupture, extensor tendon, nontraumatic, left 05/01/2021   Cerebral palsy ([place]) 04/25/2020   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   Other acquired deformity of other parts of limb 04/25/2020   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   Unspecified deformity of forearm, excluding fingers 04/25/2020   Urinary incontinence 04/25/2020   Iron deficiency anemia due to chronic blood loss 07/10/2017    ONSET DATE: 09/18/2022  REFERRING DIAG: G80.9  (ICD-10-CM) - Cerebral palsy, unspecified   THERAPY DIAG:  Abnormal posture  Muscle weakness (generalized)  Spastic quadriplegic cerebral palsy (HCC)  Other abnormalities of gait and mobility  Contracture of muscle, multiple sites  Rationale for Evaluation and Treatment Rehabilitation  SUBJECTIVE:                                                                                                                                                                                              SUBJECTIVE STATEMENT: Pt reports she has been doing ok since last visit, no falls. Pt reports minimal pain in her L hip,  2/10. Pt also reports she now has a pressure sore on her L side under her thigh below gluteal fold, unsure of stage but has protective bandaging in place.  Pt accompanied by: self  PERTINENT HISTORY: CP, Acid Reflux  PAIN:  Are you having pain? Yes: NPRS scale: 2/10 Pain location: L hip Pain description: tightness Aggravating factors: cold, legs dangling Relieving factors: laying on stomach, stretching Right hip spasms and locks.  PRECAUTIONS: Fall; B Hips Dislocated   WEIGHT BEARING RESTRICTIONS No  FALLS: Has patient fallen in last 6 months? No  LIVING ENVIRONMENT: Lives with:  2 roommates-they do not help out with her care Lives in: House/apartment Stairs: No Has following equipment at home: Wheelchair (power), shower chair, bed side commode, and [place] lift, handheld shower  PLOF: Requires assistive device for independence, Needs assistance with ADLs, Needs assistance with homemaking, and Needs assistance with transfers  Aide present x3 hours in morning and vocational rehab present x4 hours in the evening every day.  PATIENT GOALS "To work towards standing and being able to wipe myself in [place] for personal hygiene independence."  OBJECTIVE:   LOWER EXTREMITY ROM (measured in sitting today):     Passive  Right Eval  (in sitting)  Left Eval  (in sitting) Right 10/4 (in supine) Left 10/4  (in supine) Right 10/27 (in sitting) Left 10/27 (in sitting)  Hip flexion   24 deg contracture 31 degree contracture    Hip extension        Hip abduction        Hip adduction        Hip internal rotation        Hip external rotation        Knee flexion        Knee extension Lacking 22 deg Lacking 32 deg Lacking 20 deg Lacking 24 degrees Lacking 15 degrees Lacking 30 degrees  Ankle dorsiflexion        Ankle plantarflexion        Ankle inversion        Ankle eversion         (Blank rows = not tested)   TODAY'S TREATMENT:  Reassessed STG with patient. Pt is able to perform slide board transfer [place] to mat table to the R side with min A x 2 for safety initially. Slide board transfer back to [facility] to R side with mod A x 2. Pt requires 6" step under BLE in order to safely perform transfer.  Reassessed B knee contractures, see above for measurements.  Reassessed FIST, pt scores 32/56.  Sit to stand via standing frame with assist x 2 for safety and positioning. Pt requires 4" step under BLE due to height of foot plate required due to pt's short stature. Pt tolerates standing x 5 min in standing frame, no reports of lightheadedness or dizziness. Pt exhibits improved B knee and hip extension in standing position.  PATIENT EDUCATION: Education details: Continue HEP Person educated: Patient Education method: Explanation Education comprehension: verbalized understanding   HOME EXERCISE PROGRAM: Educated pt on how to perform supine HS prolonged stretch with pillow under ankles and prone hip flexor stretch if position tolerated. Can provide handout next session if needed.    GOALS: Goals reviewed with patient? Yes  SHORT TERM GOALS: Target date: 10/24/2022  Pt will be independent with instruction of caregiver for assisted LE PROM and core strength HEP. Baseline:  To be established. Goal status: IN PROGRESS  2.  Pt will perform  slide board transfer with assist x 1 consistently. Baseline:  max to total A +2, mod A x 1 (10/27) Goal status: MET  3.  Pt will decrease her B knee contractures to less than -15 degrees to increase her ability to stand Baseline: -20 (R)/-24 (L), -15(R)/-30(L) 10/27 Goal status: IN PROGRESS  4.  Pt will stand using Clarise Cruz stedy >/=30 sec in order to promote decreased contractures from prolonged sitting. Baseline: pt stood in standing frame x 5 min (10/27) Goal status: MET  5.  Pt will increase her score on the FIST to 30/56 to demonstrate increased independence and function in a sitting position. Baseline: 24/56 (10/4), 32/56 (10/27) Goal status: MET  LONG TERM GOALS: Target date: 11/21/2022 (12 wks-12/19/2022)  Pt will increase her score on the FIST to 35/56 to demonstrate increased independence and function in a sitting position. Baseline: 24/56 (10/4), 32/56 (10/27) Goal status: INITIAL  2.  Pt will decrease her hip flexion contractures to </= 20 degrees on the R and </=25 degrees on the L to improve her ability to stand Baseline: 24 (R)/ 31 (L) Goal status: INITIAL  3.  Pt will complete sliding board transfers with mod A x 1 consistently Baseline: max to total A +2, mod A x 1 (10/27) Goal status: MET  4.  Pt will stand in [place] Haggard >/=14mns to promote functional weight-bearing and strengthening of the BLE. Baseline: able to stand in standing frame x 5 min Goal status: INITIAL  5.  PT will provide list of social worker phone numbers to assist pt in finding alternative housing solutions to promote functional independence. Baseline: To be provided. Goal status: INITIAL  6.  Pt will report compliance from caregiver to PROM and stretching HEP of the BLE. Baseline: Caregiver currently not compliant per pt report. Goal status: INITIAL  ASSESSMENT:  CLINICAL IMPRESSION: Emphasis of skilled PT session on assessing STG and working on standing with assist from standing frame. Pt  has met 3/5 LTG due to improving her FIST score from 24/56 to 32/56, improving from needing max A x 2 for SB transfers to mod A x 1, and being able to tolerate standing x 5 min with assist from a standing frame. Pt has not been able to hire a new caregiver yet so has been unable to work on PROM HEP other than initial 2 stretches given that she can perform independently as well as has had to cancel several PT sessions due to transportation difficulties so has not been issued a core strengthening HEP yet. Additionally, she has improved her R knee contracture from -22 degrees initially to -15 degrees this date but her L knee contracture has only changed from -32 degrees initially to -30 degrees this date due to increased spasticity in this limb compared to her R side. Pt continues to remain very motivated during her PT sessions and continues to work towards her goals. She continues to benefit from skilled therapy services to address decreased independence with transfers, decreased sitting balance/core strength, decreased hip and knee ROM, and decreased ability to stand independently in order to increase her safety and independence with functional mobility and decrease caregiver burden. Continue POC.    OBJECTIVE IMPAIRMENTS decreased balance, decreased mobility, difficulty walking, decreased ROM, decreased strength, hypomobility, increased edema, impaired flexibility, impaired tone, impaired UE functional use, and postural dysfunction.   ACTIVITY LIMITATIONS carrying, lifting, bending, standing, squatting, stairs, transfers, bed mobility, bathing, toileting, dressing, reach over head, hygiene/grooming, locomotion level,  and caring for others  PARTICIPATION LIMITATIONS: meal prep, cleaning, laundry, driving, community activity, and occupation  [place], Past/current experiences, Time since onset of injury/illness/exacerbation, Transportation, and 1 comorbidity: CP  are also affecting patient's  functional outcome.   REHAB POTENTIAL: Good  CLINICAL DECISION MAKING: Stable/uncomplicated  EVALUATION COMPLEXITY: Low  PLAN: PT FREQUENCY: 1x/week  PT DURATION: 12 weeks  PLANNED INTERVENTIONS: Therapeutic exercises, Therapeutic activity, Neuromuscular re-education, Balance training, Gait training, Patient/Family education, Self Care, Joint mobilization, Vestibular training, Orthotic/Fit training, DME instructions, Manual therapy, and Re-evaluation  PLAN FOR NEXT SESSION: work on slide board transfers or breaking down components (anterior and lateral leans, scooting laterally), review stretching for HS and hip flexors, standing in [place] vs standing frame, reassess knee and hip contractures in supine position as time allows, issue core strengthening HEP  Managed medicaid CPT codes: [phone] - PT Re-evaluation, 97110- Therapeutic Exercise, [phone]- Neuro Re-education, [phone] - Gait Training, [phone] - Manual Therapy, [phone] - Therapeutic Activities, [phone] - Self Care, and [place], PT, DPT, CSRS 10/24/2022, 4:45 PM

## 2022-10-24 NOTE — Progress Notes (Signed)
1. No-show for appointment    Saw Dr. Sherryle Lis recently. Has appt on 10/31 with him.

## 2022-10-28 ENCOUNTER — Other Ambulatory Visit: Payer: Medicaid Other

## 2022-10-29 ENCOUNTER — Encounter: Payer: Self-pay | Admitting: Physical Therapy

## 2022-10-29 ENCOUNTER — Ambulatory Visit: Payer: Medicaid Other | Attending: *Deleted | Admitting: Physical Therapy

## 2022-10-29 DIAGNOSIS — G800 Spastic quadriplegic cerebral palsy: Secondary | ICD-10-CM | POA: Insufficient documentation

## 2022-10-29 DIAGNOSIS — M6249 Contracture of muscle, multiple sites: Secondary | ICD-10-CM | POA: Insufficient documentation

## 2022-10-29 DIAGNOSIS — R2689 Other abnormalities of gait and mobility: Secondary | ICD-10-CM | POA: Insufficient documentation

## 2022-10-29 DIAGNOSIS — R293 Abnormal posture: Secondary | ICD-10-CM | POA: Insufficient documentation

## 2022-10-29 DIAGNOSIS — M6281 Muscle weakness (generalized): Secondary | ICD-10-CM | POA: Diagnosis present

## 2022-10-29 NOTE — Therapy (Signed)
OUTPATIENT PHYSICAL THERAPY NEURO TREATMENT   Patient Name: Christine Gomez MRN: [id number] DOB:[birth year], 36 y.o., female Today's Date: 10/29/2022   PCP: Everardo Beals, NP REFERRING PROVIDER: Everardo Beals, NP    PT End of Session - 10/29/22 1406     Visit Number 5    Number of Visits 13   12+eval   Date for PT Re-Evaluation 12/26/22   pushed out due to scheduling   Authorization Type MEDICAID [place] ACCESS    PT Start Time 3267    PT Stop Time 1453   5 mins scheduling, not billed for.   PT Time Calculation (min) 50 min    Equipment Utilized During Treatment Gait belt    Activity Tolerance Patient tolerated treatment well    Behavior During Therapy WFL for tasks assessed/performed              Past Medical History:  Diagnosis Date   Acid reflux    Cerebral palsy ([place])    Past Surgical History:  Procedure Laterality Date   dorsal rhizotomy  1996   back ligament looosening surgery   ESOPHAGOGASTRODUODENOSCOPY (EGD) WITH PROPOFOL N/A 10/08/2016   Procedure: ESOPHAGOGASTRODUODENOSCOPY (EGD) WITH PROPOFOL;  Surgeon: Arta Silence, MD;  [facility];  Service: Endoscopy;  Laterality: N/A;   left arm surgery  03/2021   Patient Active Problem List   Diagnosis Date Noted   Dysphagia 05/31/2021   Epigastric pain 05/31/2021   Gastroesophageal reflux disease 05/31/2021   Hematemesis 05/31/2021   Hiatal hernia 05/31/2021   Microcytic anemia 05/31/2021   Sagittal band rupture, extensor tendon, nontraumatic, left 05/01/2021   Cerebral palsy ([place]) 04/25/2020   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   Other acquired deformity of other parts of limb 04/25/2020   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   Unspecified deformity of forearm, excluding fingers 04/25/2020   Urinary incontinence 04/25/2020   Iron deficiency anemia due to chronic blood loss 07/10/2017    ONSET DATE:  09/18/2022  REFERRING DIAG: G80.9 (ICD-10-CM) - Cerebral palsy, unspecified   THERAPY DIAG:  Abnormal posture  Muscle weakness (generalized)  Spastic quadriplegic cerebral palsy (HCC)  Other abnormalities of gait and mobility  Contracture of muscle, multiple sites  Rationale for Evaluation and Treatment Rehabilitation  SUBJECTIVE:                                                                                                                                                                                              SUBJECTIVE STATEMENT: Pt denies falls or acute changes. Pressure sore on left gluteal fold remains  covered and dry.  Pt accompanied by: self  PERTINENT HISTORY: CP, Acid Reflux  PAIN:  Are you having pain? Yes: NPRS scale: 4/10 Pain location: Right hip and midline low back Pain description: achy Aggravating factors: cold, legs dangling Relieving factors: laying on stomach, stretching Right hip spasms and locks.  PRECAUTIONS: Fall; B Hips Dislocated   WEIGHT BEARING RESTRICTIONS No  FALLS: Has patient fallen in last 6 months? No  LIVING ENVIRONMENT: Lives with:  2 roommates-they do not help out with her care Lives in: House/apartment Stairs: No Has following equipment at home: Wheelchair (power), shower chair, bed side commode, and [place] lift, handheld shower  PLOF: Requires assistive device for independence, Needs assistance with ADLs, Needs assistance with homemaking, and Needs assistance with transfers  Aide present x3 hours in morning and vocational rehab present x4 hours in the evening every day.  PATIENT GOALS "To work towards standing and being able to wipe myself in the [place] for personal hygiene independence."  OBJECTIVE:   LOWER EXTREMITY ROM (measured in sitting today):     Passive  Right Eval  (in sitting) Left Eval  (in sitting) Right 10/4 (in supine) Left 10/4  (in supine) Right 10/27 (in sitting) Left 10/27 (in  sitting)  Hip flexion   24 deg contracture 31 degree contracture    Hip extension        Hip abduction        Hip adduction        Hip internal rotation        Hip external rotation        Knee flexion        Knee extension Lacking 22 deg Lacking 32 deg Lacking 20 deg Lacking 24 degrees Lacking 15 degrees Lacking 30 degrees  Ankle dorsiflexion        Ankle plantarflexion        Ankle inversion        Ankle eversion         (Blank rows = not tested)   TODAY'S TREATMENT:  Transferred to right using slide board modA of 1.  Pt attempts to scoot laterally to right using fist to push on right side and attempts w/ pull w/o translation.   In unsupported sitting pt practices midline alt reaches > cross body reaches > midline floor level reaches w/o successfully reaching target on floor  Pt stands in EasyStand using bottom strap w/ prolonged standing 2.5 mins, demonstrating improved LE extension.  Returned to sitting with cuing to improve posture and active mechanics into sitting.  Returned to standing just short of complete upright w/ pt initiating anterior pelvic tilt for pre-glut engagement to full active standing task.  Several bouts of this performed w/ minimal PT facilitation at hips as pt is able to actively pelvic tilt in supported position well.  Returned to full standing instructing pt for upright posture w/ UE reach to table top.  Maintained standing prior to and during transfer to [facility] x64mnutes and 15 seconds.  Second PT used for safety in positioning pt deep into PWC seat.  Used features of chair and modA of therapist to adjust in tilted position due to pt fatigue.  Returned to sitting w/ PT further adjusting LLE at hip to prevent windswept posture w/ pt siting relief in sitting.  Time spent discussing scheduling and adjusted visit date and time for next week at end of visit x5 minutes (not billed for).  PATIENT EDUCATION: Education details: Continue HEP. Person  educated:  Patient Education method: Explanation Education comprehension: verbalized understanding   HOME EXERCISE PROGRAM: Educated pt on how to perform supine HS prolonged stretch with pillow under ankles and prone hip flexor stretch if position tolerated. Can provide handout next session if needed.    GOALS: Goals reviewed with patient? Yes  SHORT TERM GOALS: Target date: 10/24/2022  Pt will be independent with instruction of caregiver for assisted LE PROM and core strength HEP. Baseline:  To be established. Goal status: IN PROGRESS  2.  Pt will perform slide board transfer with assist x 1 consistently. Baseline:  max to total A +2, mod A x 1 (10/27) Goal status: MET  3.  Pt will decrease her B knee contractures to less than -15 degrees to increase her ability to stand Baseline: -20 (R)/-24 (L), -15(R)/-30(L) 10/27 Goal status: IN PROGRESS  4.  Pt will stand using Clarise Cruz stedy >/=30 sec in order to promote decreased contractures from prolonged sitting. Baseline: pt stood in standing frame x 5 min (10/27) Goal status: MET  5.  Pt will increase her score on the FIST to 30/56 to demonstrate increased independence and function in a sitting position. Baseline: 24/56 (10/4), 32/56 (10/27) Goal status: MET  LONG TERM GOALS: Target date: 11/21/2022 (12 wks-12/19/2022)  Pt will increase her score on the FIST to 35/56 to demonstrate increased independence and function in a sitting position. Baseline: 24/56 (10/4), 32/56 (10/27) Goal status: INITIAL  2.  Pt will decrease her hip flexion contractures to </= 20 degrees on the R and </=25 degrees on the L to improve her ability to stand Baseline: 24 (R)/ 31 (L) Goal status: INITIAL  3.  Pt will complete sliding board transfers with mod A x 1 consistently Baseline: max to total A +2, mod A x 1 (10/27) Goal status: MET  4.  Pt will stand in [place]na Haggard >/=42mns to promote functional weight-bearing and strengthening of the BLE. Baseline: able  to stand in standing frame x 5 min Goal status: INITIAL  5.  PT will provide list of social worker phone numbers to assist pt in finding alternative housing solutions to promote functional independence. Baseline: To be provided. Goal status: INITIAL  6.  Pt will report compliance from caregiver to PROM and stretching HEP of the BLE. Baseline: Caregiver currently not compliant per pt report. Goal status: INITIAL  ASSESSMENT:  CLINICAL IMPRESSION: Focus of skilled session today on continuing standing and engaging posterior chain to activity as able.  Pt is limited in active engagement, but is demonstrating some PROM benefits from continued use of standing frame as well as seated core activities.  She remains motivated and engaged to all tasks with plan to initiate core HEP next session.  OBJECTIVE IMPAIRMENTS decreased balance, decreased mobility, difficulty walking, decreased ROM, decreased strength, hypomobility, increased edema, impaired flexibility, impaired tone, impaired UE functional use, and postural dysfunction.   ACTIVITY LIMITATIONS carrying, lifting, bending, standing, squatting, stairs, transfers, bed mobility, bathing, toileting, dressing, reach over head, hygiene/grooming, locomotion level, and caring for others  PARTICIPATION LIMITATIONS: meal prep, cleaning, laundry, driving, community activity, and occupation  [place] Past/current experiences, Time since onset of injury/illness/exacerbation, Transportation, and 1 comorbidity: CP  are also affecting patient's functional outcome.   REHAB POTENTIAL: Good  CLINICAL DECISION MAKING: Stable/uncomplicated  EVALUATION COMPLEXITY: Low  PLAN: PT FREQUENCY: 1x/week  PT DURATION: 12 weeks  PLANNED INTERVENTIONS: Therapeutic exercises, Therapeutic activity, Neuromuscular re-education, Balance training, Gait training, Patient/Family education, Self Care, Joint mobilization,  Vestibular training, Orthotic/Fit  training, DME instructions, Manual therapy, and Re-evaluation  PLAN FOR NEXT SESSION: work on slide board transfers or breaking down components (anterior and lateral leans, scooting laterally), review stretching for HS and hip flexors, standing in [facility] vs standing frame, reassess knee and hip contractures in supine position as time allows, issue core strengthening HEP  Managed medicaid CPT codes: [phone] - PT Re-evaluation, 97110- Therapeutic Exercise, [phone]- Neuro Re-education, [phone] - Gait Training, [phone] - Manual Therapy, [phone] - Therapeutic Activities, [phone] - [place], and [place], PT, DPT 10/29/2022, 5:13 PM

## 2022-11-05 ENCOUNTER — Ambulatory Visit: Payer: Medicaid Other | Admitting: Physical Therapy

## 2022-11-19 ENCOUNTER — Ambulatory Visit: Payer: Medicaid Other | Admitting: Physical Therapy

## 2022-11-26 ENCOUNTER — Ambulatory Visit: Payer: Medicaid Other | Admitting: Physical Therapy

## 2022-11-26 DIAGNOSIS — G800 Spastic quadriplegic cerebral palsy: Secondary | ICD-10-CM

## 2022-11-26 DIAGNOSIS — R293 Abnormal posture: Secondary | ICD-10-CM

## 2022-11-26 DIAGNOSIS — M6281 Muscle weakness (generalized): Secondary | ICD-10-CM

## 2022-11-26 DIAGNOSIS — R2689 Other abnormalities of gait and mobility: Secondary | ICD-10-CM

## 2022-11-26 DIAGNOSIS — M6249 Contracture of muscle, multiple sites: Secondary | ICD-10-CM
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
## 2022-12-03 ENCOUNTER — Ambulatory Visit: Payer: Medicaid Other | Admitting: Physical Therapy

## 2022-12-10 ENCOUNTER — Ambulatory Visit: Payer: Medicaid Other | Attending: *Deleted | Admitting: Physical Therapy

## 2022-12-10 DIAGNOSIS — R29818 Other symptoms and signs involving the nervous system: Secondary | ICD-10-CM | POA: Diagnosis present

## 2022-12-10 DIAGNOSIS — R2689 Other abnormalities of gait and mobility: Secondary | ICD-10-CM | POA: Insufficient documentation

## 2022-12-10 DIAGNOSIS — G800 Spastic quadriplegic cerebral palsy: Secondary | ICD-10-CM | POA: Diagnosis present

## 2022-12-10 DIAGNOSIS — M6281 Muscle weakness (generalized): Secondary | ICD-10-CM | POA: Insufficient documentation

## 2022-12-10 DIAGNOSIS — M6249 Contracture of muscle, multiple sites: Secondary | ICD-10-CM | POA: Diagnosis present

## 2022-12-10 DIAGNOSIS — R293 Abnormal posture: Secondary | ICD-10-CM | POA: Insufficient documentation

## 2022-12-10 NOTE — Therapy (Signed)
OUTPATIENT PHYSICAL THERAPY NEURO TREATMENT   Patient Name: Christine Gomez MRN: [id number] DOB:09/06/1986, 36 y.o., female Today's Date: 12/10/2022   PCP: Everardo Beals, NP REFERRING PROVIDER: Everardo Beals, NP    PT End of Session - 12/10/22 1406     Visit Number 7    Number of Visits 13   12+eval   Date for PT Re-Evaluation 12/26/22   pushed out due to scheduling   Authorization Type MEDICAID [place] ACCESS    PT Start Time 1405    PT Stop Time 1444    PT Time Calculation (min) 39 min    Equipment Utilized During Treatment Gait belt   slide board   Activity Tolerance Patient tolerated treatment well    Behavior During Therapy WFL for tasks assessed/performed                Past Medical History:  Diagnosis Date   Acid reflux    Cerebral palsy ([place])    Past Surgical History:  Procedure Laterality Date   dorsal rhizotomy  1996   back ligament looosening surgery   ESOPHAGOGASTRODUODENOSCOPY (EGD) WITH PROPOFOL N/A 10/08/2016   Procedure: ESOPHAGOGASTRODUODENOSCOPY (EGD) WITH PROPOFOL;  Surgeon: Arta Silence, MD;  Location: [facility];  Service: Endoscopy;  Laterality: N/A;   left arm surgery  03/2021   Patient Active Problem List   Diagnosis Date Noted   Dysphagia 05/31/2021   Epigastric pain 05/31/2021   Gastroesophageal reflux disease 05/31/2021   Hematemesis 05/31/2021   Hiatal hernia 05/31/2021   Microcytic anemia 05/31/2021   Sagittal band rupture, extensor tendon, nontraumatic, left 05/01/2021   Cerebral palsy ([place]) 04/25/2020   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   Other acquired deformity of other parts of limb 04/25/2020   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   Unspecified deformity of forearm, excluding fingers 04/25/2020   Urinary incontinence 04/25/2020   Iron deficiency anemia due to chronic blood loss 07/10/2017    ONSET DATE:  09/18/2022  REFERRING DIAG: G80.9 (ICD-10-CM) - Cerebral palsy, unspecified   THERAPY DIAG:  Abnormal posture  Muscle weakness (generalized)  Spastic quadriplegic cerebral palsy (HCC)  Other abnormalities of gait and mobility  Contracture of muscle, multiple sites  Rationale for Evaluation and Treatment Rehabilitation  SUBJECTIVE:                                                                                                                                                                                              SUBJECTIVE STATEMENT: Pt reports no acute changes since last visit, feeling ok today.  Pt accompanied by:  self  PERTINENT HISTORY: CP, Acid Reflux  PAIN:  Are you having pain? No Right hip spasms and locks.  PRECAUTIONS: Fall; B Hips Dislocated   WEIGHT BEARING RESTRICTIONS No  FALLS: Has patient fallen in last 6 months? No  LIVING ENVIRONMENT: Lives with:  2 roommates-they do not help out with her care Lives in: House/apartment Stairs: No Has following equipment at home: Wheelchair (power), shower chair, bed side commode, and [place] lift, handheld shower  PLOF: Requires assistive device for independence, Needs assistance with ADLs, Needs assistance with homemaking, and Needs assistance with transfers  Aide present x3 hours in morning and vocational rehab present x4 hours in the evening every day.  PATIENT GOALS "To work towards standing and being able to wipe myself in [place] for personal hygiene independence."  OBJECTIVE:   LOWER EXTREMITY ROM (measured in sitting today):     Passive  Right Eval  (in sitting) Left Eval  (in sitting) Right 10/4 (in supine) Left 10/4  (in supine) Right 10/27 (in sitting) Left 10/27 (in sitting) Right 11/29 (in supine) Left 11/29 (in supine)  Hip flexion   24 deg contracture 31 degree contracture      Hip extension          Hip abduction          Hip adduction          Hip internal rotation           Hip external rotation          Knee flexion          Knee extension Lacking 22 deg Lacking 32 deg Lacking 20 deg Lacking 24 degrees Lacking 15 degrees Lacking 30 degrees Lacking 19 degrees Lacking 31 degrees  Ankle dorsiflexion          Ankle plantarflexion          Ankle inversion          Ankle eversion           (Blank rows = not tested)   TODAY'S TREATMENT:  THER EX: Initiated seated core strengthening HEP that pt can perform independently Seated lateral leans in PWC 3 x 10 reps B However, pt has difficulty moving w/c armrests out of the way independently. Armrest on L side low enough that pt can perform exercise, unable to perform on R side. Seated anterior leans in PWC from semi-reclined position 3 x 10 reps  Added L lateral leans and anterior leans to HEP, will continue to problem solve additional core strengthening exercises to give patient next session.   PATIENT EDUCATION: Education details: Continue HEP, PT POC Person educated: Patient Education method: Explanation Education comprehension: verbalized understanding   HOME EXERCISE PROGRAM: Educated pt on how to perform supine HS prolonged stretch with pillow under ankles and prone hip flexor stretch if position tolerated. Can provide handout next session if needed.  Access Code: [email or web address] URL: [email or web address] Date: 12/10/2022 Prepared by: Excell Seltzer  Exercises - Sidebending to Elbow Short Sit  - 1 x daily - 7 x weekly - 3 sets - 10 reps - Seated Abdominal Lean Forward in Wheelchair  - 1 x daily - 7 x weekly - 3 sets - 10 reps    GOALS: Goals reviewed with patient? Yes  SHORT TERM GOALS: Target date: 10/24/2022  Pt will be independent with instruction of caregiver for assisted LE PROM and core strength HEP. Baseline:  To be established. Goal status: IN PROGRESS  2.  Pt will perform slide board transfer with assist x 1 consistently. Baseline:  max to total A +2, mod A x 1  (10/27) Goal status: MET  3.  Pt will decrease her B knee contractures to less than -15 degrees to increase her ability to stand Baseline: -20 (R)/-24 (L), -15(R)/-30(L) 10/27 Goal status: IN PROGRESS  4.  Pt will stand using Clarise Cruz stedy >/=30 sec in order to promote decreased contractures from prolonged sitting. Baseline: pt stood in standing frame x 5 min (10/27) Goal status: MET  5.  Pt will increase her score on the FIST to 30/56 to demonstrate increased independence and function in a sitting position. Baseline: 24/56 (10/4), 32/56 (10/27) Goal status: MET  LONG TERM GOALS: Target date: 11/21/2022 (12 wks-12/19/2022)  Pt will increase her score on the FIST to 35/56 to demonstrate increased independence and function in a sitting position. Baseline: 24/56 (10/4), 32/56 (10/27), 32/56 (11/29) Goal status: IN PROGRESS  2.  Pt will decrease her hip flexion contractures to </= 20 degrees on the R and </=25 degrees on the L to improve her ability to stand Baseline: 24 (R)/ 31 (L), not reassessed 11/29 due to time contraints Goal status: IN PROGRESS  3.  Pt will complete sliding board transfers with mod A x 1 consistently Baseline: max to total A +2, mod A x 1 (10/27) Goal status: MET  4.  Pt will stand in [place] >/=44mns to promote functional weight-bearing and strengthening of the BLE. Baseline: able to stand in standing frame x 5 min Goal status: IN PROGRESS  5.  PT will provide list of social worker phone numbers to assist pt in finding alternative housing solutions to promote functional independence. Baseline: To be provided. Goal status: IN PROGRESS  6.  Pt will report compliance from caregiver to PROM and stretching HEP of the BLE. Baseline: Caregiver currently not compliant per pt report, pt working towards hiring a new caregiver. Goal status: IN PROGRESS   LONG TERM GOALS: Target date: 12/19/2022  Pt will increase her score on the FIST to 35/56 to demonstrate  increased independence and function in a sitting position. Baseline: 24/56 (10/4), 32/56 (10/27), 32/56 (11/29) Goal status: IN PROGRESS  2.  Pt will decrease her hip flexion contractures to </= 20 degrees on the R and </=25 degrees on the L to improve her ability to stand Baseline: 24 (R)/ 31 (L), not reassessed 11/29 due to time contraints Goal status: IN PROGRESS  3.  Pt will complete sliding board transfers with mod A x 1 consistently Baseline: max to total A +2, mod A x 1 (10/27) Goal status: MET  4.  Pt will stand in [place]>/=51ms to promote functional weight-bearing and strengthening of the BLE. Baseline: able to stand in standing frame x 5 min Goal status: IN PROGRESS  5.  PT will provide list of social worker phone numbers to assist pt in finding alternative housing solutions to promote functional independence. Baseline: To be provided. Goal status: IN PROGRESS  6.  Pt will report compliance from caregiver to PROM and stretching HEP of the BLE. Baseline: Caregiver currently not compliant per pt report, pt working towards hiring a new caregiver. Goal status: IN PROGRESS  ASSESSMENT:  CLINICAL IMPRESSION: Emphasis of skilled PT session on initiating core strengthening HEP that pt can perform independently at home. Pt able to safely perform L lateral leans and anterior leans from [place] she is unable to lift her w/c armrests out of  the way in order to perform R lateral leans. Pt would benefit from continued practice of core strengthening exercises and addition of exercises to her HEP. Pt would also benefit from continued practice of SB transfers in order to increase independence with these as well as work towards increased standing tolerance for spasticity management. Continue POC.   OBJECTIVE IMPAIRMENTS decreased balance, decreased mobility, difficulty walking, decreased ROM, decreased strength, hypomobility, increased edema, impaired flexibility, impaired tone,  impaired UE functional use, and postural dysfunction.   ACTIVITY LIMITATIONS carrying, lifting, bending, standing, squatting, stairs, transfers, bed mobility, bathing, toileting, dressing, reach over head, hygiene/grooming, locomotion level, and caring for others  PARTICIPATION LIMITATIONS: meal prep, cleaning, laundry, driving, community activity, and occupation  [place], Past/current experiences, Time since onset of injury/illness/exacerbation, Transportation, and 1 comorbidity: CP  are also affecting patient's functional outcome.   REHAB POTENTIAL: Good  CLINICAL DECISION MAKING: Stable/uncomplicated  EVALUATION COMPLEXITY: Low  PLAN: PT FREQUENCY: 1x/week  PT DURATION: 12 weeks  PLANNED INTERVENTIONS: Therapeutic exercises, Therapeutic activity, Neuromuscular re-education, Balance training, Gait training, Patient/Family education, Self Care, Joint mobilization, Vestibular training, Orthotic/Fit training, DME instructions, Manual therapy, and Re-evaluation  PLAN FOR NEXT SESSION: work on slide board transfers or breaking down components (anterior and lateral leans, scooting laterally), review stretching for HS and hip flexors, standing in [place] vs standing frame, add to core strengthening HEP pt can perform while seated in [place] preferably, recert 11/65 for 3 visits in January (pt wanting to work on increasing independence with SB transfers and working on standing tolerance for spasticity management)  Managed medicaid CPT codes: [phone] - PT Re-evaluation, [id number]- Therapeutic Exercise, [id number]- Neuro Re-education, [phone] - Gait Training, [place], [id number] - Therapeutic Activities, [place], and [place], PT, DPT, CSRS 12/10/2022, 2:44 PM

## 2022-12-17 ENCOUNTER — Ambulatory Visit: Payer: Medicaid Other | Admitting: Physical Therapy

## 2022-12-17 ENCOUNTER — Encounter: Payer: Self-pay | Admitting: Physical Therapy

## 2022-12-17 DIAGNOSIS — G800 Spastic quadriplegic cerebral palsy: Secondary | ICD-10-CM

## 2022-12-17 DIAGNOSIS — R2689 Other abnormalities of gait and mobility: Secondary | ICD-10-CM

## 2022-12-17 DIAGNOSIS — R293 Abnormal posture: Secondary | ICD-10-CM

## 2022-12-17 DIAGNOSIS — M6281 Muscle weakness (generalized): Secondary | ICD-10-CM

## 2022-12-17 NOTE — Therapy (Signed)
OUTPATIENT PHYSICAL THERAPY NEURO TREATMENT/RE-CERT   Patient Name: Christine Gomez MRN: [id number] DOB:[birth year], 36 y.o., female Today's Date: 12/18/2022   PCP: Everardo Beals, NP REFERRING PROVIDER: Everardo Beals, NP    PT End of Session - 12/17/22 1321     Visit Number 8    Number of Visits 21   13+8   Date for PT Re-Evaluation 02/20/23   pushed out due to scheduling; re-cert done on 40/98   Authorization Type MEDICAID [place] ACCESS    PT Start Time 1317   pt on phone at onset of session x5 mins.   PT Stop Time 1415    PT Time Calculation (min) 58 min    Equipment Utilized During Treatment Gait belt   slide board   Activity Tolerance Patient tolerated treatment well    Behavior During Therapy WFL for tasks assessed/performed                Past Medical History:  Diagnosis Date   Acid reflux    Cerebral palsy ([place])    Past Surgical History:  Procedure Laterality Date   dorsal rhizotomy  1996   back ligament looosening surgery   ESOPHAGOGASTRODUODENOSCOPY (EGD) WITH PROPOFOL N/A 10/08/2016   Procedure: ESOPHAGOGASTRODUODENOSCOPY (EGD) WITH PROPOFOL;  Surgeon: Arta Silence, MD;  Location: [facility];  Service: Endoscopy;  Laterality: N/A;   left arm surgery  03/2021   Patient Active Problem List   Diagnosis Date Noted   Dysphagia 05/31/2021   Epigastric pain 05/31/2021   Gastroesophageal reflux disease 05/31/2021   Hematemesis 05/31/2021   Hiatal hernia 05/31/2021   Microcytic anemia 05/31/2021   Sagittal band rupture, extensor tendon, nontraumatic, left 05/01/2021   Cerebral palsy ([place]) 04/25/2020   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   Other acquired deformity of other parts of limb 04/25/2020   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   Unspecified deformity of forearm, excluding fingers 04/25/2020   Urinary incontinence 04/25/2020   Iron deficiency anemia due to  chronic blood loss 07/10/2017    ONSET DATE: 09/18/2022  REFERRING DIAG: G80.9 (ICD-10-CM) - Cerebral palsy, unspecified   THERAPY DIAG:  Abnormal posture  Muscle weakness (generalized)  Spastic quadriplegic cerebral palsy (HCC)  Other abnormalities of gait and mobility  Rationale for Evaluation and Treatment Rehabilitation  SUBJECTIVE:                                                                                                                                                                                              SUBJECTIVE STATEMENT: Pt reports no acute changes since last  visit, feeling ok today.  Pt accompanied by: self  PERTINENT HISTORY: CP, Acid Reflux  PAIN:  Are you having pain? No Right hip spasms and locks.  PRECAUTIONS: Fall; B Hips Dislocated   WEIGHT BEARING RESTRICTIONS No  FALLS: Has patient fallen in last 6 months? No  LIVING ENVIRONMENT: Lives with:  2 roommates-they do not help out with her care Lives in: House/apartment Stairs: No Has following equipment at home: Wheelchair (power), shower chair, bed side commode, and [place] lift, handheld shower  PLOF: Requires assistive device for independence, Needs assistance with ADLs, Needs assistance with homemaking, and Needs assistance with transfers  Aide present x3 hours in morning and vocational rehab present x4 hours in the evening every day.  PATIENT GOALS "To work towards standing and being able to wipe myself in [place] for personal hygiene independence."  OBJECTIVE:   LOWER EXTREMITY ROM (measured in sitting today):     Passive  Right Eval  (in sitting) Left Eval  (in sitting) Right 10/4 (in supine) Left 10/4  (in supine) Right 10/27 (in sitting) Left 10/27 (in sitting) Right 11/29 (in supine) Left 11/29 (in supine)  Hip flexion   24 deg contracture 31 degree contracture      Hip extension          Hip abduction          Hip adduction          Hip internal  rotation          Hip external rotation          Knee flexion          Knee extension Lacking 22 deg Lacking 32 deg Lacking 20 deg Lacking 24 degrees Lacking 15 degrees Lacking 30 degrees Lacking 19 degrees Lacking 31 degrees  Ankle dorsiflexion          Ankle plantarflexion          Ankle inversion          Ankle eversion           (Blank rows = not tested)   TODAY'S TREATMENT:  Pt on FaceTime with partner throughout session, educated on photo/video clinic policy. Sliding board performed w/ x4 scoots to right modA FIST:  32/56 Pt placed in EZ stander x87mns > [facility] assist for safety to control lower into center of back of seat. See goal section for remainder of session.   PATIENT EDUCATION: Education details: Edu on pEngineer, maintenance  Process for re-cert and assessment outcomes.  Continue HEP w/ focus on independence for stretching and strengthening going forward. Person educated: Patient Education method: Explanation Education comprehension: verbalized understanding   HOME EXERCISE PROGRAM: Educated pt on how to perform supine HS prolonged stretch with pillow under ankles and prone hip flexor stretch if position tolerated. Can provide handout next session if needed.  Access Code: [email or web address]: [email or web address] Date: 12/10/2022 Prepared by: TExcell Seltzer Exercises - Sidebending to Elbow Short Sit  - 1 x daily - 7 x weekly - 3 sets - 10 reps - Seated Abdominal Lean Forward in Wheelchair  - 1 x daily - 7 x weekly - 3 sets - 10 reps    OLD GOALS: Goals reviewed with patient? Yes  SHORT TERM GOALS: Target date: 10/24/2022  Pt will be independent with instruction of caregiver for assisted LE PROM and core strength HEP. Baseline:  To be established. Goal status: IN PROGRESS  2.  Pt  will perform slide board transfer with assist x 1 consistently. Baseline:  max to total A +2, mod A x 1 (10/27) Goal status: MET  3.  Pt  will decrease her B knee contractures to less than -15 degrees to increase her ability to stand Baseline: -20 (R)/-24 (L), -15(R)/-30(L) 10/27 Goal status: IN PROGRESS  4.  Pt will stand using Clarise Cruz stedy >/=30 sec in order to promote decreased contractures from prolonged sitting. Baseline: pt stood in standing frame x 5 min (10/27) Goal status: MET  5.  Pt will increase her score on the FIST to 30/56 to demonstrate increased independence and function in a sitting position. Baseline: 24/56 (10/4), 32/56 (10/27) Goal status: MET  LONG TERM GOALS: Target date: 11/21/2022 (12 wks-12/19/2022)  Pt will increase her score on the FIST to 35/56 to demonstrate increased independence and function in a sitting position. Baseline: 24/56 (10/4), 32/56 (10/27), 32/56 (11/29) Goal status: IN PROGRESS  2.  Pt will decrease her hip flexion contractures to </= 20 degrees on the R and </=25 degrees on the L to improve her ability to stand Baseline: 24 (R)/ 31 (L), not reassessed 11/29 due to time contraints Goal status: IN PROGRESS  3.  Pt will complete sliding board transfers with mod A x 1 consistently Baseline: max to total A +2, mod A x 1 (10/27) Goal status: MET  4.  Pt will stand in [place] >/=54mns to promote functional weight-bearing and strengthening of the BLE. Baseline: able to stand in standing frame x 5 min Goal status: IN PROGRESS  5.  PT will provide list of social worker phone numbers to assist pt in finding alternative housing solutions to promote functional independence. Baseline: To be provided. Goal status: IN PROGRESS  6.  Pt will report compliance from caregiver to PROM and stretching HEP of the BLE. Baseline: Caregiver currently not compliant per pt report, pt working towards hiring a new caregiver. Goal status: IN PROGRESS   LONG TERM GOALS: Target date: 12/19/2022  Pt will increase her score on the FIST to 35/56 to demonstrate increased independence and function in a  sitting position. Baseline: 24/56 (10/4), 32/56 (10/27), 32/56 (11/29); 32/56 (12/20) Goal status: NOT MET  2.  Pt will decrease her hip flexion contractures to </= 20 degrees on the R and </=25 degrees on the L to improve her ability to stand Baseline: 24 (R)/ 31 (L), not reassessed 11/29 due to time contraints; not reassessed due to time constraints (12/20) Goal status: IN PROGRESS  3.  Pt will complete sliding board transfers with mod A x 1 consistently Baseline: max to total A +2, mod A x 1 (10/27); modA (12/20) Goal status: MET  4.  Pt will stand in SDenna Haggard>/=553ms to promote functional weight-bearing and strengthening of the BLE. Baseline: able to stand in standing frame x 5 min Goal status: IN PROGRESS  5.  PT will provide list of social worker phone numbers to assist pt in finding alternative housing solutions to promote functional independence. Baseline: To be provided. Goal status: IN PROGRESS  6.  Pt will report compliance from caregiver to PROM and stretching HEP of the BLE. Baseline: Caregiver currently not compliant per pt report, pt working towards hiring a new caregiver; caregiver is unable to provide this service so now focusing on independent HEP only. Goal status: NOT MET  NEW GOALS: Goals reviewed with patient? Yes  SHORT TERM GOALS: Target date: 01/16/2023  Pt will be independent with stretching  and strengthening HEP as provided. Baseline:  To be established. Goal status: INITIAL  2.  Pt will stand using Clarise Cruz stedy >/= 8 minutes performing functional upright tasks in order to promote decreased contractures from prolonged sitting. Baseline: able to stand in standing frame x 5 min (12/20) Goal status:  INITIAL  3.  Pt will increase her score on the FIST to 35/56 to demonstrate increased independence and function in a sitting position. Baseline: 32/56 (12/20) Goal status: MET  LONG TERM GOALS: Target date: 02/13/2023  Pt will increase her score on the  FIST to 38/56 to demonstrate increased independence and function in a sitting position. Baseline: 32/56 (12/20) Goal status: INITIAL  2.  Pt will decrease her hip flexion contractures to </= 20 degrees on the R and </=25 degrees on the L to improve her ability to stand Baseline: 24 (R)/ 31 (L), not reassessed 11/29 due to time contraints; not reassessed due to time constraints (12/20) Goal status: INITIAL  3.  Pt will complete sliding board transfers with min A x 1 Baseline: max to total A +2, mod A x 1 (10/27); modA (12/20) Goal status: INITIAL  4.  Pt will stand in [place] Hagg[place] >/=58mns to promote functional weight-bearing and strengthening of the BLE. Baseline: able to stand in standing frame x 5 min (12/20) Goal status: INITIAL  5.  PT will provide list of resources to assist pt in finding alternative housing solutions and funding for stander to promote functional independence. Baseline: To be provided. Goal status: INITIAL  ASSESSMENT:  CLINICAL IMPRESSION: Assessed LTGs for re-cert today with pt progressing towards 3 of 6 goals.  PT sought out resources for pt to purchase stander for spasticity management at home w/ printed materials to be provided to patient at next visit.  Her FIST score remains 32/56 with forward reach to floor and scooting remaining most challenging at this time.  She is modA of 1 to perform sliding board transfers consistently.  PT to pivot and focus on independence with HEP as pt does not have reliable assistance to perform stretching and PROM HEP at this time.  Overall, she is making modest progress and continues to benefit from skilled PT to benefit ROM gains and strength needed for participation in functional ADLs and transfers.   OBJECTIVE IMPAIRMENTS decreased balance, decreased mobility, difficulty walking, decreased ROM, decreased strength, hypomobility, increased edema, impaired flexibility, impaired tone, impaired UE functional use, and postural  dysfunction.   ACTIVITY LIMITATIONS carrying, lifting, bending, standing, squatting, stairs, transfers, bed mobility, bathing, toileting, dressing, reach over head, hygiene/grooming, locomotion level, and caring for others  PARTICIPATION LIMITATIONS: meal prep, cleaning, laundry, driving, community activity, and occupation  [place] Past/current experiences, Time since onset of injury/illness/exacerbation, Transportation, and 1 comorbidity: CP  are also affecting patient's functional outcome.   REHAB POTENTIAL: Good  CLINICAL DECISION MAKING: Stable/uncomplicated  EVALUATION COMPLEXITY: Low  PLAN: PT FREQUENCY: 1x/week  PT DURATION: 8 weeks  PLANNED INTERVENTIONS: Therapeutic exercises, Therapeutic activity, Neuromuscular re-education, Balance training, Gait training, Patient/Family education, Self Care, Joint mobilization, Vestibular training, Orthotic/Fit training, DME instructions, Manual therapy, and Re-evaluation  PLAN FOR NEXT SESSION: work on slide board transfers or breaking down components (anterior and lateral leans, scooting laterally), review stretching for HS and hip flexors, standing in S[place] standing frame, add to core strengthening HEP pt can perform while seated in [place], (pt wanting to work on increasing independence with SB transfers and working on standing tolerance for spasticity management),  Delete old goals.  Reassess hip flexion contracture and update baseline for new goals, provide resources (SW and grants) for pt to begin search for funding for stander if desired, Pt needs to schedule 4 more weeks to end of cert.  Managed medicaid CPT codes: [phone] - PT Re-evaluation, [phone]- Therapeutic Exercise, [phone]- Neuro Re-education, [phone] - Gait Training, [phone] - Manual Therapy, [phone] - Therapeutic Activities, [phone] - Self Care, and [place], PT, DPT 12/18/2022, 4:49 PM

## 2022-12-24 ENCOUNTER — Ambulatory Visit: Payer: Medicaid Other | Admitting: Physical Therapy

## 2022-12-25 ENCOUNTER — Ambulatory Visit: Payer: Medicaid Other | Admitting: Physical Therapy

## 2022-12-25 DIAGNOSIS — M6281 Muscle weakness (generalized): Secondary | ICD-10-CM

## 2022-12-25 DIAGNOSIS — G800 Spastic quadriplegic cerebral palsy: Secondary | ICD-10-CM

## 2022-12-25 DIAGNOSIS — R293 Abnormal posture: Secondary | ICD-10-CM | POA: Diagnosis not present

## 2022-12-25 DIAGNOSIS — R2689 Other abnormalities of gait and mobility: Secondary | ICD-10-CM

## 2022-12-25 DIAGNOSIS — R29818 Other symptoms and signs involving the nervous system: Secondary | ICD-10-CM

## 2022-12-25 NOTE — Therapy (Signed)
OUTPATIENT PHYSICAL THERAPY NEURO TREATMENT   Patient Name: Christine Gomez MRN: [id number] DOB:Feb 11, 1986, 36 y.o., female Today's Date: 12/25/2022   PCP: Everardo Beals, NP REFERRING PROVIDER: Everardo Beals, NP    PT End of Session - 12/25/22 1112     Visit Number 9    Number of Visits 21   13+8   Date for PT Re-Evaluation 02/20/23   pushed out due to scheduling; re-cert done on 82/50   Authorization Type MEDICAID [place] ACCESS    PT Start Time 1110   pt late   PT Stop Time 1146    PT Time Calculation (min) 36 min    Equipment Utilized During Treatment Gait belt   slide board   Activity Tolerance Patient tolerated treatment well    Behavior During Therapy [facility] for tasks assessed/performed                 Past Medical History:  Diagnosis Date   Acid reflux    Cerebral palsy ([place])    Past Surgical History:  Procedure Laterality Date   dorsal rhizotomy  1996   back ligament looosening surgery   ESOPHAGOGASTRODUODENOSCOPY (EGD) WITH PROPOFOL N/A 10/08/2016   Procedure: ESOPHAGOGASTRODUODENOSCOPY (EGD) WITH PROPOFOL;  Surgeon: Arta Silence, MD;  [facility];  Service: Endoscopy;  Laterality: N/A;   left arm surgery  03/2021   Patient Active Problem List   Diagnosis Date Noted   Dysphagia 05/31/2021   Epigastric pain 05/31/2021   Gastroesophageal reflux disease 05/31/2021   Hematemesis 05/31/2021   Hiatal hernia 05/31/2021   Microcytic anemia 05/31/2021   Sagittal band rupture, extensor tendon, nontraumatic, left 05/01/2021   Cerebral palsy ([place]) 04/25/2020   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   Other acquired deformity of other parts of limb 04/25/2020   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   Unspecified deformity of forearm, excluding fingers 04/25/2020   Urinary incontinence 04/25/2020   Iron deficiency anemia due to chronic blood loss 07/10/2017     ONSET DATE: 09/18/2022  REFERRING DIAG: G80.9 (ICD-10-CM) - Cerebral palsy, unspecified   THERAPY DIAG:  Abnormal posture  Muscle weakness (generalized)  Spastic quadriplegic cerebral palsy (HCC)  Other abnormalities of gait and mobility  Other symptoms and signs involving the nervous system  Rationale for Evaluation and Treatment Rehabilitation  SUBJECTIVE:                                                                                                                                                                                              SUBJECTIVE STATEMENT: Pt reports she has  a pressure sore on her sacrum (?) and is wearing a duoderm patch on it. Pt reports no other acute changes since last session, does have chronic back pain from weather.  Pt accompanied by: self  PERTINENT HISTORY: CP, Acid Reflux  PAIN:  Are you having pain? No Right hip spasms and locks.  PRECAUTIONS: Fall; B Hips Dislocated   WEIGHT BEARING RESTRICTIONS No  FALLS: Has patient fallen in last 6 months? No  LIVING ENVIRONMENT: Lives with:  2 roommates-they do not help out with her care Lives in: House/apartment Stairs: No Has following equipment at home: Wheelchair (power), shower chair, bed side commode, and [place] lift, handheld shower  PLOF: Requires assistive device for independence, Needs assistance with ADLs, Needs assistance with homemaking, and Needs assistance with transfers  Aide present x3 hours in morning and vocational rehab present x4 hours in the evening every day.  PATIENT GOALS "To work towards standing and being able to wipe myself in the [place] for personal hygiene independence."  OBJECTIVE:   LOWER EXTREMITY ROM (measured in sitting today):     Passive  Right Eval  (in sitting) Left Eval  (in sitting) Right 10/4 (in supine) Left 10/4  (in supine) Right 10/27 (in sitting) Left 10/27 (in sitting) Right 11/29 (in supine) Left 11/29 (in supine)  Right 12/28 (in supine) Left 12/28 (In supine)  Hip flexion   24 deg contracture 31 degree contracture     20 degree contracture 17 degree contracture  Hip extension            Hip abduction            Hip adduction            Hip internal rotation            Hip external rotation            Knee flexion            Knee extension Lacking 22 deg Lacking 32 deg Lacking 20 deg Lacking 24 degrees Lacking 15 degrees Lacking 30 degrees Lacking 19 degrees Lacking 31 degrees Lacking 15 degrees Lacking 32 degrees  Ankle dorsiflexion            Ankle plantarflexion            Ankle inversion            Ankle eversion             (Blank rows = not tested)   TODAY'S TREATMENT:  Pt received seated in PWC. Slide board transfer [place] to mat table with max A to the R. Sit to supine mod A needed for BLE management. Reassessed hip and knee contractures in supine position, see table above. Pt returned to sitting EOM with max A needed for BLE management and trunk elevation. Slide board transfer mat table to [place] with min A to the R.  Provided handouts of resources for patient obtained by primary therapist in order for her to be able to find a Education officer, museum and regarding grants that are available to pay for medical equipment, etc such as a standing frame for use at home.   PATIENT EDUCATION: Education details: Continue HEP w/ focus on independence for stretching and strengthening going forward. Person educated: Patient Education method: Explanation Education comprehension: verbalized understanding   HOME EXERCISE PROGRAM: Educated pt on how to perform supine HS prolonged stretch with pillow under ankles and prone hip flexor stretch if position tolerated. Can provide  handout next session if needed.  Access Code: [email or web address] URL: [email or web address] Date: 12/10/2022 Prepared by: Excell Seltzer  Exercises - Sidebending to Elbow Short Sit  - 1 x daily - 7 x weekly - 3 sets - 10 reps - Seated  Abdominal Lean Forward in Wheelchair  - 1 x daily - 7 x weekly - 3 sets - 10 reps    NEW GOALS: Goals reviewed with patient? Yes  SHORT TERM GOALS: Target date: 01/16/2023  Pt will be independent with stretching and strengthening HEP as provided. Baseline:  To be established. Goal status: INITIAL  2.  Pt will stand using Clarise Cruz stedy >/= 8 minutes performing functional upright tasks in order to promote decreased contractures from prolonged sitting. Baseline: able to stand in standing frame x 5 min (12/20) Goal status:  INITIAL  3.  Pt will increase her score on the FIST to 35/56 to demonstrate increased independence and function in a sitting position. Baseline: 32/56 (12/20) Goal status: MET  LONG TERM GOALS: Target date: 02/13/2023  Pt will increase her score on the FIST to 38/56 to demonstrate increased independence and function in a sitting position. Baseline: 32/56 (12/20) Goal status: INITIAL  2.  Pt will decrease her hip flexion contractures to </= 20 degrees on the R and </=25 degrees on the L to improve her ability to stand Baseline: 24 (R)/ 31 (L), not reassessed 11/29 due to time contraints; not reassessed due to time constraints (12/20); 20 (R)/17 (L) 12/28 Goal status: MET  3.  Pt will complete sliding board transfers with min A x 1 Baseline: max to total A +2, mod A x 1 (10/27); modA (12/20) Goal status: INITIAL  4.  Pt will stand in Denna Haggard >/=19mns to promote functional weight-bearing and strengthening of the BLE. Baseline: able to stand in standing frame x 5 min (12/20) Goal status: INITIAL  5.  PT will provide list of resources to assist pt in finding alternative housing solutions and funding for stander to promote functional independence. Baseline: To be provided. Goal status: INITIAL  ASSESSMENT:  CLINICAL IMPRESSION: Emphasis of skilled PT session performing slide board transfer PC[place]eto/from mat table, reassessing hip flexion contractures, and  providing pt with resources for how to obtain a sEducation officer, museumand for grants that she can apply for in order to fund medical equipment such as a standing frame for use at home. Pt continues to range from min to max A for her slide board transfers due to body habitus, decreased UE strength, and decreased core control. Pt exhibits a decrease in B hip flexion contractures this date with an improvement in hip ROM from 24 degrees from neutral initially in her R hip to 20 degrees this date and 31 degrees from neutral initially in her L hip to 17 degrees this date. Pt continues to benefit from skilled therapy services in order to increase her independence with slide board transfers and decrease her spasticity/increase her tolerance for WB with use of standing frame. Continue POC.    OBJECTIVE IMPAIRMENTS decreased balance, decreased mobility, difficulty walking, decreased ROM, decreased strength, hypomobility, increased edema, impaired flexibility, impaired tone, impaired UE functional use, and postural dysfunction.   ACTIVITY LIMITATIONS carrying, lifting, bending, standing, squatting, stairs, transfers, bed mobility, bathing, toileting, dressing, reach over head, hygiene/grooming, locomotion level, and caring for others  PARTICIPATION LIMITATIONS: meal prep, cleaning, laundry, driving, community activity, and occupation  [place] Past/current experiences, Time since onset of injury/illness/exacerbation, Transportation, and 1 comorbidity: CP  are also affecting patient's functional outcome.   REHAB POTENTIAL: Good  CLINICAL DECISION MAKING: Stable/uncomplicated  EVALUATION COMPLEXITY: Low  PLAN: PT FREQUENCY: 1x/week  PT DURATION: 8 weeks  PLANNED INTERVENTIONS: Therapeutic exercises, Therapeutic activity, Neuromuscular re-education, Balance training, Gait training, Patient/Family education, Self Care, Joint mobilization, Vestibular training, Orthotic/Fit training, DME instructions,  Manual therapy, and Re-evaluation  PLAN FOR NEXT SESSION: work on slide board transfers or breaking down components (anterior and lateral leans, scooting laterally), review stretching for HS and hip flexors, standing in [employer] vs standing frame, add to core strengthening HEP pt can perform while seated in [place] preferably, (pt wanting to work on increasing independence with SB transfers and working on standing tolerance for spasticity management), Pt needs to schedule 4 more weeks to end of cert.  Managed medicaid CPT codes: [phone] - PT Re-evaluation, [phone]- Therapeutic Exercise, [phone]- Neuro Re-education, [phone] - Gait Training, [phone] - Manual Therapy, [phone] - Therapeutic Activities, [phone] - Self Care, and [place]   Excell Seltzer, PT, DPT, CSRS 12/25/2022, 11:46 AM

## 2022-12-31 ENCOUNTER — Ambulatory Visit: Payer: Medicaid Other | Attending: *Deleted | Admitting: Physical Therapy

## 2022-12-31 DIAGNOSIS — R293 Abnormal posture: Secondary | ICD-10-CM | POA: Insufficient documentation

## 2022-12-31 DIAGNOSIS — M6249 Contracture of muscle, multiple sites: Secondary | ICD-10-CM | POA: Insufficient documentation

## 2022-12-31 DIAGNOSIS — R2689 Other abnormalities of gait and mobility: Secondary | ICD-10-CM | POA: Insufficient documentation

## 2022-12-31 DIAGNOSIS — G800 Spastic quadriplegic cerebral palsy: Secondary | ICD-10-CM | POA: Diagnosis present

## 2022-12-31 DIAGNOSIS — R29818 Other symptoms and signs involving the nervous system: Secondary | ICD-10-CM | POA: Insufficient documentation

## 2022-12-31 DIAGNOSIS — M6281 Muscle weakness (generalized): Secondary | ICD-10-CM | POA: Diagnosis present

## 2022-12-31 NOTE — Therapy (Signed)
OUTPATIENT PHYSICAL THERAPY NEURO TREATMENT-10th VISIT PROGRESS NOTE   Patient Name: Christine Gomez MRN: [id number] DOB:25-Jan-1986, 37 y.o., female Today's Date: 12/31/2022   PCP: Everardo Beals, NP REFERRING PROVIDER: Everardo Beals, NP   Physical Therapy Progress Note   Dates of Reporting Period:09/25/22 - 12/31/2022  See Note below for Objective Data and Assessment of Progress/Goals.  Thank you for the referral of this patient. Excell Seltzer, PT, DPT, CSRS    PT End of Session - 12/31/22 1403     Visit Number 10    Number of Visits 21   13+8   Date for PT Re-Evaluation 02/20/23   pushed out due to scheduling; re-cert done on 88/64   Authorization Type MEDICAID [place] ACCESS    PT Start Time 1400    PT Stop Time 1455    PT Time Calculation (min) 55 min    Equipment Utilized During Treatment Gait belt   slide board   Activity Tolerance Patient tolerated treatment well    Behavior During Therapy WFL for tasks assessed/performed                  Past Medical History:  Diagnosis Date   Acid reflux    Cerebral palsy ([place])    Past Surgical History:  Procedure Laterality Date   dorsal rhizotomy  1996   back ligament looosening surgery   ESOPHAGOGASTRODUODENOSCOPY (EGD) WITH PROPOFOL N/A 10/08/2016   Procedure: ESOPHAGOGASTRODUODENOSCOPY (EGD) WITH PROPOFOL;  Surgeon: Arta Silence, MD;  Location: [facility];  Service: Endoscopy;  Laterality: N/A;   left arm surgery  03/2021   Patient Active Problem List   Diagnosis Date Noted   Dysphagia 05/31/2021   Epigastric pain 05/31/2021   Gastroesophageal reflux disease 05/31/2021   Hematemesis 05/31/2021   Hiatal hernia 05/31/2021   Microcytic anemia 05/31/2021   Sagittal band rupture, extensor tendon, nontraumatic, left 05/01/2021   Cerebral palsy ([place]) 04/25/2020   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   Other acquired deformity of other parts of limb 04/25/2020   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 04/25/2020   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 Pt reports her sacral wound is healing, slowly, still wearing the duoderm dressing on it. No falls or other acute changes since last visit.  Pt accompanied by: self  PERTINENT HISTORY: CP, Acid Reflux  PAIN:  Are you having pain? No Right hip spasms and locks.  PRECAUTIONS: Fall; B Hips Dislocated   WEIGHT BEARING RESTRICTIONS No  FALLS: Has patient fallen in last 6 months? No  LIVING ENVIRONMENT: Lives with:  2 roommates-they do not help out with her care Lives in: House/apartment Stairs: No Has following equipment at home: Wheelchair (power), shower chair, bed side commode, and [place] lift, handheld shower  PLOF: Requires assistive device for independence, Needs assistance with ADLs, Needs assistance with homemaking, and Needs assistance with transfers  Aide present x3 hours in morning and vocational rehab present x4 hours in the evening every day.  PATIENT GOALS "To work towards standing and being able to wipe myself in the [place] for personal hygiene independence."  OBJECTIVE:   LOWER EXTREMITY  ROM (measured in sitting today):     Passive  Right Eval  (in sitting) Left Eval  (in sitting) Right 10/4 (in supine) Left 10/4  (in supine) Right 10/27 (in sitting) Left 10/27 (in sitting) Right 11/29 (in supine) Left 11/29 (in supine) Right 12/28 (in supine) Left 12/28 (In supine)  Hip flexion   24 deg contracture 31 degree contracture     20 degree contracture 17 degree contracture  Hip extension            Hip abduction            Hip adduction            Hip internal rotation            Hip external rotation            Knee flexion            Knee extension Lacking 22 deg Lacking 32 deg Lacking 20 deg Lacking 24 degrees Lacking 15 degrees Lacking 30 degrees Lacking 19 degrees Lacking 31 degrees Lacking 15 degrees Lacking 32 degrees  Ankle dorsiflexion            Ankle plantarflexion            Ankle inversion            Ankle eversion             (Blank rows = not tested)   TODAY'S TREATMENT:  Session focus on use of standing frame for BLE weight-bearing, activation of LE musculature, and spasticity management: x 5:30 (soreness in bottom of feet and calves) x 5:00 (soreness in hamstrings) x 6:30 (soreness in hamstrings and calves, R>L)  No signs/symptoms of OH in standing. Pt reports a decrease in pain in her R hip following use of standing frame this session with some ongoing BLE soreness due to stretch provided during standing.   PATIENT EDUCATION: Education details: Continue HEP w/ focus on independence for stretching and strengthening going forward. Person educated: Patient Education method: Explanation Education comprehension: verbalized understanding   HOME EXERCISE PROGRAM: Educated pt on how to perform supine HS prolonged stretch with pillow under ankles and prone hip flexor stretch if position tolerated. Can provide handout next session if needed.  Access Code: [email or web address] URL: [email or web address] Date: 12/10/2022 Prepared by: Excell Seltzer  Exercises - Sidebending to Elbow Short Sit  - 1  x daily - 7 x weekly - 3 sets - 10 reps - Seated Abdominal Lean Forward in Wheelchair  - 1 x daily - 7 x weekly - 3 sets - 10 reps    NEW GOALS: Goals reviewed with patient? Yes  SHORT TERM GOALS: Target date: 01/16/2023  Pt will be independent with stretching and strengthening HEP as provided. Baseline:  To be established. Goal status: INITIAL  2.  Pt will stand using Clarise Cruz stedy >/= 8 minutes performing functional upright tasks in order to promote decreased contractures from prolonged sitting. Baseline: able to stand in standing frame x 5 min (12/20) Goal status:  INITIAL  3.  Pt will increase her score on the FIST to 35/56 to demonstrate increased independence and function in a sitting position. Baseline: 32/56 (12/20) Goal status: MET  LONG TERM GOALS: Target date: 02/13/2023  Pt will increase her score on the FIST to 38/56 to demonstrate increased independence and function in a sitting position. Baseline: 32/56 (12/20) Goal status: INITIAL  2.  Pt will decrease her hip flexion contractures to </= 20 degrees on the R and </=25 degrees on the L to improve her ability to stand Baseline: 24 (R)/ 31 (L), not reassessed 11/29 due to time contraints; not reassessed due to time constraints (12/20); 20 (R)/17 (L) 12/28 Goal status: MET  3.  Pt will complete sliding board transfers with min A x 1 Baseline: max to total A +2, mod A x 1 (10/27); modA (12/20) Goal status: INITIAL  4.  Pt will stand in [place]na Haggard >/=4mns to promote functional weight-bearing and strengthening of the BLE. Baseline: able to stand in standing frame x 5 min (12/20) Goal status: INITIAL  5.  PT will provide list of resources to assist pt in finding alternative housing solutions and funding for stander to promote functional independence. Baseline: To be provided. Goal status: INITIAL  ASSESSMENT:  CLINICAL IMPRESSION: Emphasis of skilled PT  session on working on standing in standing frame/easy stand to increase WB through BLE for increased muscle strengthening and activation as well as for spasticity management. Pt exhibits good tolerance for standing this date with some soreness in her BLE following activity. Pt continues to benefit from skilled therapy services in order to increase her independence with slide board transfers and decrease her spasticity/increase her tolerance for WB with use of standing frame. Continue POC.    OBJECTIVE IMPAIRMENTS decreased balance, decreased mobility, difficulty walking, decreased ROM, decreased strength, hypomobility, increased edema, impaired flexibility, impaired tone, impaired UE functional use, and postural dysfunction.   ACTIVITY LIMITATIONS carrying, lifting, bending, standing, squatting, stairs, transfers, bed mobility, bathing, toileting, dressing, reach over head, hygiene/grooming, locomotion level, and caring for others  PARTICIPATION LIMITATIONS: meal prep, cleaning, laundry, driving, community activity, and occupation  [place] Past/current experiences, Time since onset of injury/illness/exacerbation, Transportation, and 1 comorbidity: CP  are also affecting patient's functional outcome.   REHAB POTENTIAL: Good  CLINICAL DECISION MAKING: Stable/uncomplicated  EVALUATION COMPLEXITY: Low  PLAN: PT FREQUENCY: 1x/week  PT DURATION: 8 weeks  PLANNED INTERVENTIONS: Therapeutic exercises, Therapeutic activity, Neuromuscular re-education, Balance training, Gait training, Patient/Family education, Self Care, Joint mobilization, Vestibular training, Orthotic/Fit training, DME instructions, Manual therapy, and Re-evaluation  PLAN FOR NEXT SESSION: work on slide board transfers or breaking down components (anterior and lateral leans, scooting laterally), review stretching for HS and hip flexors, standing in [place] standing frame, add to core strengthening HEP pt can  perform while seated in [place], (  pt wanting to work on increasing independence with SB transfers and working on standing tolerance for spasticity management)  Managed medicaid CPT codes: [phone] - PT Re-evaluation, [phone]- Therapeutic Exercise, [phone]- Neuro Re-education, [phone] - Gait Training, [phone] - Manual Therapy, [phone] - Therapeutic Activities, [phone] - Self Care, and [place]   Excell Seltzer, PT, DPT, CSRS 12/31/2022, 2:56 PM

## 2023-01-07 ENCOUNTER — Ambulatory Visit: Payer: Medicaid Other | Admitting: Physical Therapy

## 2023-01-07 ENCOUNTER — Encounter: Payer: Self-pay | Admitting: Physical Therapy

## 2023-01-07 DIAGNOSIS — R293 Abnormal posture: Secondary | ICD-10-CM

## 2023-01-07 DIAGNOSIS — M6281 Muscle weakness (generalized): Secondary | ICD-10-CM

## 2023-01-07 DIAGNOSIS — M6249 Contracture of muscle, multiple sites: Secondary | ICD-10-CM

## 2023-01-07 DIAGNOSIS — G800 Spastic quadriplegic cerebral palsy: Secondary | ICD-10-CM

## 2023-01-07 DIAGNOSIS — R2689 Other abnormalities of gait and mobility: Secondary | ICD-10-CM

## 2023-01-07 NOTE — Therapy (Signed)
OUTPATIENT PHYSICAL THERAPY NEURO TREATMENT   Patient Name: Christine Gomez MRN: [id number] DOB:[birth year], 37 y.o., female Today's Date: 01/07/2023   PCP: Marva Panda, NP REFERRING PROVIDER: Marva Panda, NP     PT End of Session - 01/07/23 1402     Visit Number 11    Number of Visits 21   13+8   Date for PT Re-Evaluation 02/20/23   pushed out due to scheduling; re-cert done on 12/21   Authorization Type MEDICAID [place] ACCESS    PT Start Time 1402    PT Stop Time 1450    PT Time Calculation (min) 48 min    Equipment Utilized During Treatment Gait belt   slide board   Activity Tolerance Patient tolerated treatment well    Behavior During Therapy WFL for tasks assessed/performed                  Past Medical History:  Diagnosis Date   Acid reflux    Cerebral palsy ([facility])    Past Surgical History:  Procedure Laterality Date   dorsal rhizotomy  1996   back ligament looosening surgery   ESOPHAGOGASTRODUODENOSCOPY (EGD) WITH PROPOFOL N/A 10/08/2016   Procedure: ESOPHAGOGASTRODUODENOSCOPY (EGD) WITH PROPOFOL;  Surgeon: Willis Modena, MD;  Location: [facility];  Service: Endoscopy;  Laterality: N/A;   left arm surgery  03/2021   Patient Active Problem List   Diagnosis Date Noted   Dysphagia 05/31/2021   Epigastric pain 05/31/2021   Gastroesophageal reflux disease 05/31/2021   Hematemesis 05/31/2021   Hiatal hernia 05/31/2021   Microcytic anemia 05/31/2021   Sagittal band rupture, extensor tendon, nontraumatic, left 05/01/2021   Cerebral palsy ([facility]) 04/25/2020   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   Other acquired deformity of other parts of limb 04/25/2020   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   Unspecified deformity of forearm, excluding fingers 04/25/2020   Urinary incontinence 04/25/2020   Iron deficiency anemia due to chronic blood loss 07/10/2017    ONSET  DATE: 09/18/2022  REFERRING DIAG: G80.9 (ICD-10-CM) - Cerebral palsy, unspecified   THERAPY DIAG:  Abnormal posture  Muscle weakness (generalized)  Spastic quadriplegic cerebral palsy (HCC)  Other abnormalities of gait and mobility  Contracture of muscle, multiple sites  Rationale for Evaluation and Treatment Rehabilitation  SUBJECTIVE:                                                                                                                                                                                              SUBJECTIVE STATEMENT: Pt reports that sacral wound is closed now  and no dressing covering it today.  Her right hip is bothering her today and where the wound was itches.  She inquires about practicing pulling to stand in the // bars in future session.  Pt requesting to do stander today due to hip bothering her and thinking stretching in weight bearing will help.  She gets the next round of Botox following next PT session.  Pt accompanied by: self  PERTINENT HISTORY: CP, Acid Reflux  PAIN:  Are you having pain? No Right hip spasms and locks.  PRECAUTIONS: Fall; B Hips Dislocated   WEIGHT BEARING RESTRICTIONS No  FALLS: Has patient fallen in last 6 months? No  LIVING ENVIRONMENT: Lives with:  2 roommates-they do not help out with her care Lives in: House/apartment Stairs: No Has following equipment at home: Wheelchair (power), shower chair, bed side commode, and [place] lift, handheld shower  PLOF: Requires assistive device for independence, Needs assistance with ADLs, Needs assistance with homemaking, and Needs assistance with transfers  Aide present x3 hours in morning and vocational rehab present x4 hours in the evening every day.  PATIENT GOALS "To work towards standing and being able to wipe myself in the [place] for personal hygiene independence."  OBJECTIVE:   LOWER EXTREMITY ROM (measured in sitting today):     Passive   Right Eval  (in sitting) Left Eval  (in sitting) Right 10/4 (in supine) Left 10/4  (in supine) Right 10/27 (in sitting) Left 10/27 (in sitting) Right 11/29 (in supine) Left 11/29 (in supine) Right 12/28 (in supine) Left 12/28 (In supine)  Hip flexion   24 deg contracture 31 degree contracture     20 degree contracture 17 degree contracture  Hip extension            Hip abduction            Hip adduction            Hip internal rotation            Hip external rotation            Knee flexion            Knee extension Lacking 22 deg Lacking 32 deg Lacking 20 deg Lacking 24 degrees Lacking 15 degrees Lacking 30 degrees Lacking 19 degrees Lacking 31 degrees Lacking 15 degrees Lacking 32 degrees  Ankle dorsiflexion            Ankle plantarflexion            Ankle inversion            Ankle eversion             (Blank rows = not tested)   TODAY'S TREATMENT:  EZ Stander for weight bearing stretch today: 3 initial stands and returns to sitting for stander adjustments to prevent excessive ankle stretch, correct hip posture as pt was sitting to posterior-right requiring maxA to adjust, and tightening the seat support to facilitate better hip posture for upright Fourth stand x 6:09 (2 right hip muscle spasms) Fifth stand x24 seconds prior to onset of spasm and pt requesting to return to sit Emphasis of sitting on pt pushing bottom back and pushing away from platform to facilitate glut engagement.  PATIENT EDUCATION: Education details: Continue stretching at home, follow-up with resources provided as pt has made calls, but received none back about funding for stander. Person educated: Patient Education method: Explanation Education comprehension: verbalized understanding   HOME EXERCISE PROGRAM: Educated  pt on how to perform supine HS prolonged stretch with pillow under ankles and prone hip flexor stretch if position tolerated. Can provide handout next session if needed.  Access Code:  [email or web address] URL: [email or web address] Date: 12/10/2022 Prepared by: Excell Seltzer  Exercises - Sidebending to Elbow Short Sit  - 1 x daily - 7 x weekly - 3 sets - 10 reps - Seated Abdominal Lean Forward in Wheelchair  - 1 x daily - 7 x weekly - 3 sets - 10 reps    NEW GOALS: Goals reviewed with patient? Yes  SHORT TERM GOALS: Target date: 01/16/2023  Pt will be independent with stretching and strengthening HEP as provided. Baseline:  To be established. Goal status: INITIAL  2.  Pt will stand using Clarise Cruz stedy >/= 8 minutes performing functional upright tasks in order to promote decreased contractures from prolonged sitting. Baseline: able to stand in standing frame x 5 min (12/20) Goal status:  INITIAL  3.  Pt will increase her score on the FIST to 35/56 to demonstrate increased independence and function in a sitting position. Baseline: 32/56 (12/20) Goal status: MET  LONG TERM GOALS: Target date: 02/13/2023  Pt will increase her score on the FIST to 38/56 to demonstrate increased independence and function in a sitting position. Baseline: 32/56 (12/20) Goal status: INITIAL  2.  Pt will decrease her hip flexion contractures to </= 20 degrees on the R and </=25 degrees on the L to improve her ability to stand Baseline: 24 (R)/ 31 (L), not reassessed 11/29 due to time contraints; not reassessed due to time constraints (12/20); 20 (R)/17 (L) 12/28 Goal status: MET  3.  Pt will complete sliding board transfers with min A x 1 Baseline: max to total A +2, mod A x 1 (10/27); modA (12/20) Goal status: INITIAL  4.  Pt will stand in Denna Haggard >/=34mins to promote functional weight-bearing and strengthening of the BLE. Baseline: able to stand in standing frame x 5 min (12/20) Goal status: INITIAL  5.  PT will provide list of resources to assist pt in finding alternative housing solutions and funding for stander to promote functional independence. Baseline: To be  provided. Goal status: INITIAL  ASSESSMENT:  CLINICAL IMPRESSION: Continued use of the stander today to facilitate stretching of LE as well as weight bearing to manage spasms related to both contractures and tone.  Pt able to be positioned in almost ideal upright position w/ several stander adjustments made following repeated short bouts of standing.  She continues to benefit from skilled PT to further engage core musculature and UE to functional care tasks as well as attempt increased weight bearing challenge with pull-to-stand to be attempted in parallel bars in coming sessions.  OBJECTIVE IMPAIRMENTS decreased balance, decreased mobility, difficulty walking, decreased ROM, decreased strength, hypomobility, increased edema, impaired flexibility, impaired tone, impaired UE functional use, and postural dysfunction.   ACTIVITY LIMITATIONS carrying, lifting, bending, standing, squatting, stairs, transfers, bed mobility, bathing, toileting, dressing, reach over head, hygiene/grooming, locomotion level, and caring for others  PARTICIPATION LIMITATIONS: meal prep, cleaning, laundry, driving, community activity, and occupation  [place], Past/current experiences, Time since onset of injury/illness/exacerbation, Transportation, and 1 comorbidity: CP  are also affecting patient's functional outcome.   REHAB POTENTIAL: Good  CLINICAL DECISION MAKING: Stable/uncomplicated  EVALUATION COMPLEXITY: Low  PLAN: PT FREQUENCY: 1x/week  PT DURATION: 8 weeks  PLANNED INTERVENTIONS: Therapeutic exercises, Therapeutic activity, Neuromuscular re-education, Balance training, Gait training, Patient/Family education, Self Care, Joint mobilization, Vestibular training,  Orthotic/Fit training, DME instructions, Manual therapy, and Re-evaluation  PLAN FOR NEXT SESSION: work on slide board transfers or breaking down components (anterior and lateral leans, scooting laterally), review stretching for HS  and hip flexors, standing in [place] vs standing frame, add to core strengthening HEP pt can perform while seated in [place] preferably, (pt wanting to work on increasing independence with SB transfers and working on standing tolerance for spasticity management), pull to stand in // bars-may need block under feet?  Managed medicaid CPT codes: 99357 - PT Re-evaluation, [phone]- Therapeutic Exercise, [phone]- Neuro Re-education, [phone] - Gait Training, [phone] - Manual Therapy, [phone] - Therapeutic Activities, [phone] - Self Care, and [phone] - Orthotic Fit   Sadie Haber, PT, DPT 01/07/2023, 5:03 PM

## 2023-01-14 ENCOUNTER — Ambulatory Visit: Payer: Medicaid Other | Admitting: Physical Therapy

## 2023-01-14 ENCOUNTER — Encounter: Payer: Self-pay | Admitting: Physical Therapy

## 2023-01-14 DIAGNOSIS — R293 Abnormal posture: Secondary | ICD-10-CM | POA: Diagnosis not present

## 2023-01-14 DIAGNOSIS — M6281 Muscle weakness (generalized): Secondary | ICD-10-CM

## 2023-01-14 DIAGNOSIS — M6249 Contracture of muscle, multiple sites: Secondary | ICD-10-CM

## 2023-01-14 DIAGNOSIS — G800 Spastic quadriplegic cerebral palsy: Secondary | ICD-10-CM

## 2023-01-14 DIAGNOSIS — R2689 Other abnormalities of gait and mobility: Secondary | ICD-10-CM

## 2023-01-14 NOTE — Therapy (Signed)
OUTPATIENT PHYSICAL THERAPY NEURO TREATMENT   Patient Name: Christine Gomez MRN: [id number] DOB:04/22/1986, 37 y.o., female Today's Date: 01/16/2023   PCP: Marva Panda, NP REFERRING PROVIDER: Marva Panda, NP     01/14/23 1407  PT Visits / Re-Eval  Visit Number 12  Number of Visits 21 (13+8)  Date for PT Re-Evaluation 02/20/23 (pushed out due to scheduling; re-cert done on 12/21)  Authorization  Authorization Type MEDICAID [place] ACCESS  PT Time Calculation  PT Start Time 1405  PT Stop Time 1449  PT Time Calculation (min) 44 min  PT - End of Session  Equipment Utilized During Treatment Gait belt (slide board)  Activity Tolerance Patient tolerated treatment well  Behavior During Therapy [facility] for tasks assessed/performed         Past Medical History:  Diagnosis Date   Acid reflux    Cerebral palsy (HCC)    Past Surgical History:  Procedure Laterality Date   dorsal rhizotomy  1996   back ligament looosening surgery   ESOPHAGOGASTRODUODENOSCOPY (EGD) WITH PROPOFOL N/A 10/08/2016   Procedure: ESOPHAGOGASTRODUODENOSCOPY (EGD) WITH PROPOFOL;  Surgeon: Willis Modena, MD;  Location: [facility];  Service: Endoscopy;  Laterality: N/A;   left arm surgery  03/2021   Patient Active Problem List   Diagnosis Date Noted   Dysphagia 05/31/2021   Epigastric pain 05/31/2021   Gastroesophageal reflux disease 05/31/2021   Hematemesis 05/31/2021   Hiatal hernia 05/31/2021   Microcytic anemia 05/31/2021   Sagittal band rupture, extensor tendon, nontraumatic, left 05/01/2021   Cerebral palsy (HCC) 04/25/2020   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   Other acquired deformity of other parts of limb 04/25/2020   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   Unspecified deformity of forearm, excluding fingers 04/25/2020   Urinary incontinence 04/25/2020   Iron deficiency anemia due to chronic  blood loss 07/10/2017    ONSET DATE: 09/18/2022  REFERRING DIAG: G80.9 (ICD-10-CM) - Cerebral palsy, unspecified   THERAPY DIAG:  Abnormal posture  Muscle weakness (generalized)  Spastic quadriplegic cerebral palsy (HCC)  Other abnormalities of gait and mobility  Contracture of muscle, multiple sites  Rationale for Evaluation and Treatment Rehabilitation  SUBJECTIVE:                                                                                                                                                                                              SUBJECTIVE STATEMENT: Pt reports that sacral wound is healed now, mildly itchy.  Her right hip and low back continue to bother her today.  Pt accompanied by: self  PERTINENT  HISTORY: CP, Acid Reflux  PAIN:  Are you having pain? Yes: NPRS scale: 2/10 Pain location: right hip and low back Pain description: tightness Aggravating factors: certain positions Relieving factors: moving out of those positions Right hip spasms and locks.  PRECAUTIONS: Fall; B Hips Dislocated   WEIGHT BEARING RESTRICTIONS No  FALLS: Has patient fallen in last 6 months? No  LIVING ENVIRONMENT: Lives with:  2 roommates-they do not help out with her care Lives in: House/apartment Stairs: No Has following equipment at home: Wheelchair (power), shower chair, bed side commode, and [place] lift, handheld shower  PLOF: Requires assistive device for independence, Needs assistance with ADLs, Needs assistance with homemaking, and Needs assistance with transfers  Aide present x3 hours in morning and vocational rehab present x4 hours in the evening every day.  PATIENT GOALS "To work towards standing and being able to wipe myself in [place] for personal hygiene independence."  OBJECTIVE:   LOWER EXTREMITY ROM (measured in sitting today):     Passive  Right Eval  (in sitting) Left Eval  (in sitting) Right 10/4 (in supine) Left  10/4  (in supine) Right 10/27 (in sitting) Left 10/27 (in sitting) Right 11/29 (in supine) Left 11/29 (in supine) Right 12/28 (in supine) Left 12/28 (In supine)  Hip flexion   24 deg contracture 31 degree contracture     20 degree contracture 17 degree contracture  Hip extension            Hip abduction            Hip adduction            Hip internal rotation            Hip external rotation            Knee flexion            Knee extension Lacking 22 deg Lacking 32 deg Lacking 20 deg Lacking 24 degrees Lacking 15 degrees Lacking 30 degrees Lacking 19 degrees Lacking 31 degrees Lacking 15 degrees Lacking 32 degrees  Ankle dorsiflexion            Ankle plantarflexion            Ankle inversion            Ankle eversion             (Blank rows = not tested)   TODAY'S TREATMENT:  -Time taken for patient to search for video of former stand attempt with prior therapist, unable to locate.  Time not billed for x7 minutes.  Pt elaborates on prior attempts w/ initial therapist. -Standing mod-maxA several reps in // bars w/ eccentric lower w/ cues to promote sitting deep onto edge of w/c, pt has heavy RUE use pull to stand > standing holds x5-15 seconds, PT trialed knee separation w/ stands w/ pt reporting bilateral hip relief in upright -slide board left and right w/c <> mat x1 modA, pt experiencing increased right ankle discomfort during transfer to left due to difficulty with foot placement, improved w/ adjustment and transfer completed in parts vs all at once.  Use of foot plate to continue to promote carryover to home and community as pt would lack access to step as used in previous trials.  Discussed having aide or other person come into session to see how to utilize foot plate safely.  PATIENT EDUCATION: Education details: Continue stretching at home, continue calling resources provided about funding for stander. Person  educated: Patient Education method: Explanation Education comprehension:  verbalized understanding   HOME EXERCISE PROGRAM: Educated pt on how to perform supine HS prolonged stretch with pillow under ankles and prone hip flexor stretch if position tolerated. Can provide handout next session if needed.  Access Code: [email or web address] URL: [email or web address] Date: 12/10/2022 Prepared by: Excell Seltzer  Exercises - Sidebending to Elbow Short Sit  - 1 x daily - 7 x weekly - 3 sets - 10 reps - Seated Abdominal Lean Forward in Wheelchair  - 1 x daily - 7 x weekly - 3 sets - 10 reps    NEW GOALS: Goals reviewed with patient? Yes  SHORT TERM GOALS: Target date: 01/16/2023  Pt will be independent with stretching and strengthening HEP as provided. Baseline:  To be established. Goal status: INITIAL  2.  Pt will stand using Clarise Cruz stedy >/= 8 minutes performing functional upright tasks in order to promote decreased contractures from prolonged sitting. Baseline: able to stand in standing frame x 5 min (12/20) Goal status:  INITIAL  3.  Pt will increase her score on the FIST to 35/56 to demonstrate increased independence and function in a sitting position. Baseline: 32/56 (12/20) Goal status: MET  LONG TERM GOALS: Target date: 02/13/2023  Pt will increase her score on the FIST to 38/56 to demonstrate increased independence and function in a sitting position. Baseline: 32/56 (12/20) Goal status: INITIAL  2.  Pt will decrease her hip flexion contractures to </= 20 degrees on the R and </=25 degrees on the L to improve her ability to stand Baseline: 24 (R)/ 31 (L), not reassessed 11/29 due to time contraints; not reassessed due to time constraints (12/20); 20 (R)/17 (L) 12/28 Goal status: MET  3.  Pt will complete sliding board transfers with min A x 1 Baseline: max to total A +2, mod A x 1 (10/27); modA (12/20) Goal status: INITIAL  4.  Pt will stand in Denna Haggard >/=13mins to promote functional weight-bearing and strengthening of the BLE. Baseline:  able to stand in standing frame x 5 min (12/20) Goal status: INITIAL  5.  PT will provide list of resources to assist pt in finding alternative housing solutions and funding for stander to promote functional independence. Baseline: To be provided. Goal status: INITIAL  ASSESSMENT:  CLINICAL IMPRESSION: Emphasis of skilled session on progressing to pt performing pull-to-stand to increase weight bearing through BLE, combat contractures, and improve overall posture.  Pt tolerates task well with good fatigue management.  Further addressed sliding board transfers w/ pt maintaining a modA level for success with task.  Will continue per POC.  OBJECTIVE IMPAIRMENTS decreased balance, decreased mobility, difficulty walking, decreased ROM, decreased strength, hypomobility, increased edema, impaired flexibility, impaired tone, impaired UE functional use, and postural dysfunction.   ACTIVITY LIMITATIONS carrying, lifting, bending, standing, squatting, stairs, transfers, bed mobility, bathing, toileting, dressing, reach over head, hygiene/grooming, locomotion level, and caring for others  PARTICIPATION LIMITATIONS: meal prep, cleaning, laundry, driving, community activity, and occupation  [place], Past/current experiences, Time since onset of injury/illness/exacerbation, Transportation, and 1 comorbidity: CP  are also affecting patient's functional outcome.   REHAB POTENTIAL: Good  CLINICAL DECISION MAKING: Stable/uncomplicated  EVALUATION COMPLEXITY: Low  PLAN: PT FREQUENCY: 1x/week  PT DURATION: 8 weeks  PLANNED INTERVENTIONS: Therapeutic exercises, Therapeutic activity, Neuromuscular re-education, Balance training, Gait training, Patient/Family education, Self Care, Joint mobilization, Vestibular training, Orthotic/Fit training, DME instructions, Manual therapy, and Re-evaluation  PLAN FOR NEXT SESSION: work on [employer] transfers  or breaking down components (anterior and  lateral leans, scooting laterally), review stretching for HS and hip flexors, standing in [place] vs standing frame, add to core strengthening HEP pt can perform while seated in [place] preferably, (pt wanting to work on increasing independence with SB transfers and working on standing tolerance for spasticity management), ASSESS STGS, pull to stand in // bars, discuss d/c at end of current cert.  Managed medicaid CPT codes: [phone] - PT Re-evaluation, [phone]- Therapeutic Exercise, [phone]- Neuro Re-education, [phone] - Gait Training, [phone] - Manual Therapy, [phone] - Therapeutic Activities, [phone] - Self Care, and [place], PT, DPT 01/16/2023, 8:43 AM

## 2023-01-21 ENCOUNTER — Ambulatory Visit: Payer: Medicaid Other | Admitting: Physical Therapy

## 2023-01-21 ENCOUNTER — Encounter: Payer: Self-pay | Admitting: Physical Therapy

## 2023-01-21 DIAGNOSIS — R29818 Other symptoms and signs involving the nervous system: Secondary | ICD-10-CM

## 2023-01-21 DIAGNOSIS — R293 Abnormal posture: Secondary | ICD-10-CM | POA: Diagnosis not present

## 2023-01-21 DIAGNOSIS — M6249 Contracture of muscle, multiple sites: Secondary | ICD-10-CM

## 2023-01-21 DIAGNOSIS — M6281 Muscle weakness (generalized): Secondary | ICD-10-CM

## 2023-01-21 DIAGNOSIS — R2689 Other abnormalities of gait and mobility: Secondary | ICD-10-CM

## 2023-01-21 NOTE — Therapy (Signed)
OUTPATIENT PHYSICAL THERAPY NEURO TREATMENT   Patient Name: Christine Gomez MRN: [id number] DOB:1986/04/02, 37 y.o., female Today's Date: 01/21/2023   PCP: Everardo Beals, NP REFERRING PROVIDER: Everardo Beals, NP    PT End of Session - 01/21/23 1412     Visit Number 13    Number of Visits 21   13+8   Date for PT Re-Evaluation 02/20/23   pushed out due to scheduling; re-cert done on 40/98   Authorization Type MEDICAID [place] ACCESS    PT Start Time 1406    PT Stop Time 1449    PT Time Calculation (min) 43 min    Equipment Utilized During Treatment Gait belt    Activity Tolerance Patient tolerated treatment well;Patient limited by fatigue    Behavior During Therapy [facility] for tasks assessed/performed              Past Medical History:  Diagnosis Date   Acid reflux    Cerebral palsy ([place])    Past Surgical History:  Procedure Laterality Date   dorsal rhizotomy  1996   back ligament looosening surgery   ESOPHAGOGASTRODUODENOSCOPY (EGD) WITH PROPOFOL N/A 10/08/2016   Procedure: ESOPHAGOGASTRODUODENOSCOPY (EGD) WITH PROPOFOL;  Surgeon: Arta Silence, MD;  Location: [facility];  Service: Endoscopy;  Laterality: N/A;   left arm surgery  03/2021   Patient Active Problem List   Diagnosis Date Noted   Dysphagia 05/31/2021   Epigastric pain 05/31/2021   Gastroesophageal reflux disease 05/31/2021   Hematemesis 05/31/2021   Hiatal hernia 05/31/2021   Microcytic anemia 05/31/2021   Sagittal band rupture, extensor tendon, nontraumatic, left 05/01/2021   Cerebral palsy ([place]) 04/25/2020   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   Other acquired deformity of other parts of limb 04/25/2020   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   Unspecified deformity of forearm, excluding fingers 04/25/2020   Urinary incontinence 04/25/2020   Iron deficiency anemia due to chronic blood loss 07/10/2017     ONSET DATE: 09/18/2022  REFERRING DIAG: G80.9 (ICD-10-CM) - Cerebral palsy, unspecified   THERAPY DIAG:  Abnormal posture  Muscle weakness (generalized)  Other abnormalities of gait and mobility  Contracture of muscle, multiple sites  Other symptoms and signs involving the nervous system  Rationale for Evaluation and Treatment Rehabilitation  SUBJECTIVE:                                                                                                                                                                                              SUBJECTIVE STATEMENT: Pt states she is very tired today and almost did  not come due to not resting well last night.  She has a lot going on right now.  No further updates on obtaining funding for stander. Pt accompanied by: self  PERTINENT HISTORY: CP, Acid Reflux  PAIN:  Are you having pain? No Right hip spasms and locks.  PRECAUTIONS: Fall; B Hips Dislocated   WEIGHT BEARING RESTRICTIONS No  FALLS: Has patient fallen in last 6 months? No  LIVING ENVIRONMENT: Lives with:  2 roommates-they do not help out with her care Lives in: House/apartment Stairs: No Has following equipment at home: Wheelchair (power), shower chair, bed side commode, and [place] lift, handheld shower  PLOF: Requires assistive device for independence, Needs assistance with ADLs, Needs assistance with homemaking, and Needs assistance with transfers  Aide present x3 hours in morning and vocational rehab present x4 hours in the evening every day.  PATIENT GOALS "To work towards standing and being able to wipe myself in the [place] for personal hygiene independence."  OBJECTIVE:   LOWER EXTREMITY ROM (measured in sitting today):     Passive  Right Eval  (in sitting) Left Eval  (in sitting) Right 10/4 (in supine) Left 10/4  (in supine) Right 10/27 (in sitting) Left 10/27 (in sitting) Right 11/29 (in supine) Left 11/29 (in supine) Right  12/28 (in supine) Left 12/28 (In supine)  Hip flexion   24 deg contracture 31 degree contracture     20 degree contracture 17 degree contracture  Hip extension            Hip abduction            Hip adduction            Hip internal rotation            Hip external rotation            Knee flexion            Knee extension Lacking 22 deg Lacking 32 deg Lacking 20 deg Lacking 24 degrees Lacking 15 degrees Lacking 30 degrees Lacking 19 degrees Lacking 31 degrees Lacking 15 degrees Lacking 32 degrees  Ankle dorsiflexion            Ankle plantarflexion            Ankle inversion            Ankle eversion             (Blank rows = not tested)   TODAY'S TREATMENT:  -Discussed HEP w/ pt stating she has been focusing on laying on her stomach and elbow taps to her bed. Antony Salmon stander x8:13 w/ pt performing weight shifting, trunk rotations, making adjustment to posture w/ therapist minA using mirror feedback from forward and lateral perspectives > made adjustment to seat sling to promote better upright returning to stand w/ mildly improved hip extension x2:21; attempted to adjust sling further using different rung, but was not most supportive option so unable to attempt additional stand with setup; PT assists pt in adjustments in PWC using features of chair to promote correct upright posture.  Focused sitting tasks on controlled bow w/ bottom back pushing far into seat for improved eccentric control and glute engagement.  Pt requires prolonged rest following tasks. -Discussed ongoing POC briefly at end of session.   PATIENT EDUCATION: Education details: Continue stretching at home, continue calling resources provided about funding for stander.  Progress towards goals. Person educated: Patient Education method: Explanation Education comprehension: verbalized understanding  HOME EXERCISE PROGRAM: Educated pt on how to perform supine HS prolonged stretch with pillow under ankles and prone hip  flexor stretch if position tolerated.  Access Code: [email or web address] URL: [email or web address] Date: 12/10/2022 Prepared by: Excell Seltzer  Exercises - Sidebending to Elbow Short Sit  - 1 x daily - 7 x weekly - 3 sets - 10 reps - Seated Abdominal Lean Forward in Wheelchair  - 1 x daily - 7 x weekly - 3 sets - 10 reps    NEW GOALS: Goals reviewed with patient? Yes  SHORT TERM GOALS: Target date: 01/16/2023  Pt will be independent with stretching and strengthening HEP as provided. Baseline: Established and pt intermittently compliant (1/24) Goal status: MET  2.  Pt will stand using Clarise Cruz stedy >/= 8 minutes performing functional upright tasks in order to promote decreased contractures from prolonged sitting. Baseline: able to stand in standing frame x 5 min (12/20); 8 mins standing frame (1/24) Goal status:  MET  3.  Pt will increase her score on the FIST to 35/56 to demonstrate increased independence and function in a sitting position. Baseline: 32/56 (12/20) Goal status: INITIAL  LONG TERM GOALS: Target date: 02/13/2023  Pt will increase her score on the FIST to 38/56 to demonstrate increased independence and function in a sitting position. Baseline: 32/56 (12/20) Goal status: INITIAL  2.  Pt will decrease her hip flexion contractures to </= 20 degrees on the R and </=25 degrees on the L to improve her ability to stand Baseline: 24 (R)/ 31 (L), not reassessed 11/29 due to time contraints; not reassessed due to time constraints (12/20); 20 (R)/17 (L) 12/28 Goal status: MET  3.  Pt will complete sliding board transfers with min A x 1 Baseline: max to total A +2, mod A x 1 (10/27); modA (12/20) Goal status: INITIAL  4.  Pt will stand in Denna Haggard >/=5mins to promote functional weight-bearing and strengthening of the BLE. Baseline: able to stand in standing frame x 5 min (12/20) Goal status: INITIAL  5.  PT will provide list of resources to assist pt in finding  alternative housing solutions and funding for stander to promote functional independence. Baseline: To be provided. Goal status: INITIAL  ASSESSMENT:  CLINICAL IMPRESSION: Entirety of session this visit focused on addressing upright tolerance with use of stander.  Pt maintains good upright, somewhat limited by ankle contractures and fatigue this visit, but meeting goal level of 8 minutes w/ ability to weight shift and perform other upright mobility.  She continues to benefit from skilled PT to promote ease of transfers and management of contractures for both pain management and functional mobility.  OBJECTIVE IMPAIRMENTS decreased balance, decreased mobility, difficulty walking, decreased ROM, decreased strength, hypomobility, increased edema, impaired flexibility, impaired tone, impaired UE functional use, and postural dysfunction.   ACTIVITY LIMITATIONS carrying, lifting, bending, standing, squatting, stairs, transfers, bed mobility, bathing, toileting, dressing, reach over head, hygiene/grooming, locomotion level, and caring for others  PARTICIPATION LIMITATIONS: meal prep, cleaning, laundry, driving, community activity, and occupation  [place], Past/current experiences, Time since onset of injury/illness/exacerbation, Transportation, and 1 comorbidity: CP  are also affecting patient's functional outcome.   REHAB POTENTIAL: Good  CLINICAL DECISION MAKING: Stable/uncomplicated  EVALUATION COMPLEXITY: Low  PLAN: PT FREQUENCY: 1x/week  PT DURATION: 8 weeks  PLANNED INTERVENTIONS: Therapeutic exercises, Therapeutic activity, Neuromuscular re-education, Balance training, Gait training, Patient/Family education, Self Care, Joint mobilization, Vestibular training, Orthotic/Fit training, DME instructions, Manual therapy, and Re-evaluation  PLAN FOR NEXT  SESSION: work on [employer] transfers or breaking down components (anterior and lateral leans, scooting laterally), review  stretching for HS and hip flexors, standing in [place] vs standing frame, add to core strengthening HEP pt can perform while seated in PWC preferably, (pt wanting to work on increasing independence with SB transfers and working on standing tolerance for spasticity management), ASSESS FIST, pull to stand in // bars, discuss d/c at end of current cert, would Corene Cornea be more beneficial than EZ stander?  Managed medicaid CPT codes: 76160 - PT Re-evaluation, [phone]- Therapeutic Exercise, [phone]- Neuro Re-education, [phone] - Gait Training, [phone] - Manual Therapy, [phone] - Therapeutic Activities, [phone] - Self Care, and [phone] - Orthotic Fit   Sadie Haber, PT, DPT 01/21/2023, 5:43 PM

## 2023-01-28 ENCOUNTER — Ambulatory Visit: Payer: Medicaid Other | Admitting: Physical Therapy

## 2023-01-28 DIAGNOSIS — R293 Abnormal posture: Secondary | ICD-10-CM

## 2023-01-28 DIAGNOSIS — R2689 Other abnormalities of gait and mobility: Secondary | ICD-10-CM

## 2023-01-28 DIAGNOSIS — M6281 Muscle weakness (generalized): Secondary | ICD-10-CM

## 2023-01-28 DIAGNOSIS — G800 Spastic quadriplegic cerebral palsy: Secondary | ICD-10-CM

## 2023-01-28 DIAGNOSIS — M6249 Contracture of muscle, multiple sites: Secondary | ICD-10-CM

## 2023-01-28 NOTE — Therapy (Signed)
OUTPATIENT PHYSICAL THERAPY NEURO TREATMENT   Patient Name: Christine Gomez MRN: [id number] DOB:[birth year], 37 y.o., female Today's Date: 01/28/2023   PCP: Everardo Beals, NP REFERRING PROVIDER: Everardo Beals, NP    PT End of Session - 01/28/23 1139     Visit Number 14    Number of Visits 21   13+8   Date for PT Re-Evaluation 02/20/23   pushed out due to scheduling; re-cert done on 50/09   Authorization Type MEDICAID [place] ACCESS    PT Start Time 1105    PT Stop Time 1150    PT Time Calculation (min) 45 min    Equipment Utilized During Treatment Gait belt    Activity Tolerance Patient tolerated treatment well;Patient limited by fatigue    Behavior During Therapy [place] for tasks assessed/performed              Past Medical History:  Diagnosis Date   Acid reflux    Cerebral palsy ([place])    Past Surgical History:  Procedure Laterality Date   dorsal rhizotomy  1996   back ligament looosening surgery   ESOPHAGOGASTRODUODENOSCOPY (EGD) WITH PROPOFOL N/A 10/08/2016   Procedure: ESOPHAGOGASTRODUODENOSCOPY (EGD) WITH PROPOFOL;  Surgeon: Arta Silence, MD;  Location: [facility];  Service: Endoscopy;  Laterality: N/A;   left arm surgery  03/2021   Patient Active Problem List   Diagnosis Date Noted   Dysphagia 05/31/2021   Epigastric pain 05/31/2021   Gastroesophageal reflux disease 05/31/2021   Hematemesis 05/31/2021   Hiatal hernia 05/31/2021   Microcytic anemia 05/31/2021   Sagittal band rupture, extensor tendon, nontraumatic, left 05/01/2021   Cerebral palsy ([place]) 04/25/2020   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   Other acquired deformity of other parts of limb 04/25/2020   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   Unspecified deformity of forearm, excluding fingers 04/25/2020   Urinary incontinence 04/25/2020   Iron deficiency anemia due to chronic blood loss 07/10/2017     ONSET DATE: 09/18/2022  REFERRING DIAG: G80.9 (ICD-10-CM) - Cerebral palsy, unspecified   THERAPY DIAG:  Abnormal posture  Muscle weakness (generalized)  Other abnormalities of gait and mobility  Contracture of muscle, multiple sites  Spastic quadriplegic cerebral palsy (HCC)  Rationale for Evaluation and Treatment Rehabilitation  SUBJECTIVE:                                                                                                                                                                                              SUBJECTIVE STATEMENT: Pt reports no acute changes since last visit, some increased soreness in her  R hip today. Per pt report she has reached out to resources provided by primary PT regarding financial assistance for a standing frame, is awaiting a callback.  Pt accompanied by: self  PERTINENT HISTORY: CP, Acid Reflux  PAIN:  Are you having pain? No Right hip spasms and locks.  PRECAUTIONS: Fall; B Hips Dislocated   WEIGHT BEARING RESTRICTIONS No  FALLS: Has patient fallen in last 6 months? No  LIVING ENVIRONMENT: Lives with:  2 roommates-they do not help out with her care Lives in: House/apartment Stairs: No Has following equipment at home: Wheelchair (power), shower chair, bed side commode, and [place] lift, handheld shower  PLOF: Requires assistive device for independence, Needs assistance with ADLs, Needs assistance with homemaking, and Needs assistance with transfers  Aide present x3 hours in morning and vocational rehab present x4 hours in the evening every day.  PATIENT GOALS "To work towards standing and being able to wipe myself in [place] for personal hygiene independence."  OBJECTIVE:   LOWER EXTREMITY ROM (measured in sitting today):     Passive  Right Eval  (in sitting) Left Eval  (in sitting) Right 10/4 (in supine) Left 10/4  (in supine) Right 10/27 (in sitting) Left 10/27 (in sitting) Right 11/29  (in supine) Left 11/29 (in supine) Right 12/28 (in supine) Left 12/28 (In supine)  Hip flexion   24 deg contracture 31 degree contracture     20 degree contracture 17 degree contracture  Hip extension            Hip abduction            Hip adduction            Hip internal rotation            Hip external rotation            Knee flexion            Knee extension Lacking 22 deg Lacking 32 deg Lacking 20 deg Lacking 24 degrees Lacking 15 degrees Lacking 30 degrees Lacking 19 degrees Lacking 31 degrees Lacking 15 degrees Lacking 32 degrees  Ankle dorsiflexion            Ankle plantarflexion            Ankle inversion            Ankle eversion             (Blank rows = not tested)   TODAY'S TREATMENT:  Reassessed FIST: 27/56  Slide board transfer PWC to mat table with max A to the R. Pt needs increased assist for transfer this date. Pt also exhibits decreased sitting balance even with BLE supported on 6" step while seated EOM and needs CGA for support. Sit to stand dependently in standing frame x 5 min, time limited due to time constraints of session. Transferred back to [facility] via standing frame at end of session.  PATIENT EDUCATION: Education details: Continue stretching at home, continue calling resources provided about funding for stander.  Progress towards goals. Person educated: Patient Education method: Explanation Education comprehension: verbalized understanding   HOME EXERCISE PROGRAM: Educated pt on how to perform supine HS prolonged stretch with pillow under ankles and prone hip flexor stretch if position tolerated.  Access Code: [email or web address] URL: [email or web address] Date: 12/10/2022 Prepared by: Peter Congo  Exercises - Sidebending to Elbow Short Sit  - 1 x daily - 7 x weekly - 3 sets - 10 reps - Seated Abdominal  Lean Forward in Wheelchair  - 1 x daily - 7 x weekly - 3 sets - 10 reps    NEW GOALS: Goals reviewed with patient? Yes  SHORT TERM GOALS:  Target date: 01/16/2023  Pt will be independent with stretching and strengthening HEP as provided. Baseline: Established and pt intermittently compliant (1/24) Goal status: MET  2.  Pt will stand using Clarise Cruz stedy >/= 8 minutes performing functional upright tasks in order to promote decreased contractures from prolonged sitting. Baseline: able to stand in standing frame x 5 min (12/20); 8 mins standing frame (1/24) Goal status:  MET  3.  Pt will increase her score on the FIST to 35/56 to demonstrate increased independence and function in a sitting position. Baseline: 32/56 (12/20) Goal status: INITIAL  LONG TERM GOALS: Target date: 02/13/2023  Pt will increase her score on the FIST to 38/56 to demonstrate increased independence and function in a sitting position. Baseline: 32/56 (12/20), 27/56 (1/31) Goal status: IN PROGRESS  2.  Pt will decrease her hip flexion contractures to </= 20 degrees on the R and </=25 degrees on the L to improve her ability to stand Baseline: 24 (R)/ 31 (L), not reassessed 11/29 due to time contraints; not reassessed due to time constraints (12/20); 20 (R)/17 (L) 12/28 Goal status: MET  3.  Pt will complete sliding board transfers with min A x 1 Baseline: max to total A +2, mod A x 1 (10/27); modA (12/20) Goal status: INITIAL  4.  Pt will stand in Denna Haggard >/=36mins to promote functional weight-bearing and strengthening of the BLE. Baseline: able to stand in standing frame x 5 min (12/20) Goal status: INITIAL  5.  PT will provide list of resources to assist pt in finding alternative housing solutions and funding for stander to promote functional independence. Baseline: To be provided. Goal status: INITIAL  ASSESSMENT:  CLINICAL IMPRESSION: Emphasis of skilled PT session on reassessing FIST and working on standing in standing frame. Pt exhibits increased spasticity and tightness in BLE and trunk this date increasing her assistance needed with functional  mobility and for sitting balance. Pt scores lower on the FIST this date (27/56) as compared to previous scores (32/56). Additionally, pt limited by time spent in standing this date in standing frame due to time constraints of therapy session once FIST assessed and equipment setup. Pt waiting to hear back about funding for a standing frame at home. Pt continues to benefit from skilled therapy services to work towards her LTGs. Continue POC.   OBJECTIVE IMPAIRMENTS decreased balance, decreased mobility, difficulty walking, decreased ROM, decreased strength, hypomobility, increased edema, impaired flexibility, impaired tone, impaired UE functional use, and postural dysfunction.   ACTIVITY LIMITATIONS carrying, lifting, bending, standing, squatting, stairs, transfers, bed mobility, bathing, toileting, dressing, reach over head, hygiene/grooming, locomotion level, and caring for others  PARTICIPATION LIMITATIONS: meal prep, cleaning, laundry, driving, community activity, and occupation  [place], Past/current experiences, Time since onset of injury/illness/exacerbation, Transportation, and 1 comorbidity: CP  are also affecting patient's functional outcome.   REHAB POTENTIAL: Good  CLINICAL DECISION MAKING: Stable/uncomplicated  EVALUATION COMPLEXITY: Low  PLAN: PT FREQUENCY: 1x/week  PT DURATION: 8 weeks  PLANNED INTERVENTIONS: Therapeutic exercises, Therapeutic activity, Neuromuscular re-education, Balance training, Gait training, Patient/Family education, Self Care, Joint mobilization, Vestibular training, Orthotic/Fit training, DME instructions, Manual therapy, and Re-evaluation  PLAN FOR NEXT SESSION: work on slide board transfers or breaking down components (anterior and lateral leans, scooting laterally), review stretching for HS and  hip flexors, standing in [place] vs standing frame, add to core strengthening HEP pt can perform while seated in [place] preferably, (pt wanting  to work on increasing independence with SB transfers and working on standing tolerance for spasticity management), pull to stand in // bars, discuss d/c at end of current cert, would Denna Haggard be more beneficial than EZ stander?  Managed medicaid CPT codes: [phone] - PT Re-evaluation, [phone]- Therapeutic Exercise, [phone]- Neuro Re-education, [phone] - Gait Training, [phone] - Manual Therapy, [phone] - Therapeutic Activities, [phone] - Self Care, and [place]   Excell Seltzer, PT, DPT, CSRS 01/28/2023, 11:50 AM

## 2023-02-04 ENCOUNTER — Ambulatory Visit: Payer: Medicaid Other | Attending: *Deleted | Admitting: Physical Therapy

## 2023-02-04 DIAGNOSIS — G800 Spastic quadriplegic cerebral palsy: Secondary | ICD-10-CM | POA: Diagnosis present

## 2023-02-04 DIAGNOSIS — R29818 Other symptoms and signs involving the nervous system: Secondary | ICD-10-CM | POA: Insufficient documentation

## 2023-02-04 DIAGNOSIS — M6249 Contracture of muscle, multiple sites: Secondary | ICD-10-CM | POA: Diagnosis present

## 2023-02-04 DIAGNOSIS — R293 Abnormal posture: Secondary | ICD-10-CM | POA: Diagnosis present

## 2023-02-04 DIAGNOSIS — M6281 Muscle weakness (generalized): Secondary | ICD-10-CM | POA: Diagnosis present

## 2023-02-04 DIAGNOSIS — R2689 Other abnormalities of gait and mobility: Secondary | ICD-10-CM | POA: Diagnosis present

## 2023-02-04 NOTE — Therapy (Signed)
OUTPATIENT PHYSICAL THERAPY NEURO TREATMENT   Patient Name: Christine Gomez MRN: [id number] DOB:1986-03-25, 37 y.o., female Today's Date: 02/04/2023   PCP: Everardo Beals, NP REFERRING PROVIDER: Everardo Beals, NP    PT End of Session - 02/04/23 1403     Visit Number 15    Number of Visits 21   13+8   Date for PT Re-Evaluation 02/20/23   pushed out due to scheduling; re-cert done on 29/93   Authorization Type MEDICAID [place] ACCESS    PT Start Time 1402    PT Stop Time 1450    PT Time Calculation (min) 48 min    Equipment Utilized During Treatment Gait belt    Activity Tolerance Patient tolerated treatment well    Behavior During Therapy [facility] for tasks assessed/performed               Past Medical History:  Diagnosis Date   Acid reflux    Cerebral palsy ([place])    Past Surgical History:  Procedure Laterality Date   dorsal rhizotomy  1996   back ligament looosening surgery   ESOPHAGOGASTRODUODENOSCOPY (EGD) WITH PROPOFOL N/A 10/08/2016   Procedure: ESOPHAGOGASTRODUODENOSCOPY (EGD) WITH PROPOFOL;  Surgeon: Arta Silence, MD;  Location: [facility];  Service: Endoscopy;  Laterality: N/A;   left arm surgery  03/2021   Patient Active Problem List   Diagnosis Date Noted   Dysphagia 05/31/2021   Epigastric pain 05/31/2021   Gastroesophageal reflux disease 05/31/2021   Hematemesis 05/31/2021   Hiatal hernia 05/31/2021   Microcytic anemia 05/31/2021   Sagittal band rupture, extensor tendon, nontraumatic, left 05/01/2021   Cerebral palsy ([place]) 04/25/2020   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   Other acquired deformity of other parts of limb 04/25/2020   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   Unspecified deformity of forearm, excluding fingers 04/25/2020   Urinary incontinence 04/25/2020   Iron deficiency anemia due to chronic blood loss 07/10/2017    ONSET DATE:  09/18/2022  REFERRING DIAG: G80.9 (ICD-10-CM) - Cerebral palsy, unspecified   THERAPY DIAG:  Abnormal posture  Muscle weakness (generalized)  Other abnormalities of gait and mobility  Contracture of muscle, multiple sites  Spastic quadriplegic cerebral palsy (HCC)  Other symptoms and signs involving the nervous system  Rationale for Evaluation and Treatment Rehabilitation  SUBJECTIVE:                                                                                                                                                                                              SUBJECTIVE STATEMENT: Pt reports she is having pain/soreness  in her low back and R hip today, not rated. Pt is still waiting to hear back about coverage for an [place]. Pt reiterates that her current home health aide will not work on a stretching program with her and will not assist her with working on slide board transfers.  Pt accompanied by: self  PERTINENT HISTORY: CP, Acid Reflux  PAIN:  Are you having pain? No Right hip spasms and locks.  PRECAUTIONS: Fall; B Hips Dislocated   WEIGHT BEARING RESTRICTIONS No  FALLS: Has patient fallen in last 6 months? No  LIVING ENVIRONMENT: Lives with:  2 roommates-they do not help out with her care Lives in: House/apartment Stairs: No Has following equipment at home: Wheelchair (power), shower chair, bed side commode, and [place] lift, handheld shower  PLOF: Requires assistive device for independence, Needs assistance with ADLs, Needs assistance with homemaking, and Needs assistance with transfers  Aide present x3 hours in morning and vocational rehab present x4 hours in the evening every day.  PATIENT GOALS "To work towards standing and being able to wipe myself in the [place] for personal hygiene independence."  OBJECTIVE:   LOWER EXTREMITY ROM (measured in sitting today):     Passive  Right Eval  (in sitting) Left Eval  (in  sitting) Right 10/4 (in supine) Left 10/4  (in supine) Right 10/27 (in sitting) Left 10/27 (in sitting) Right 11/29 (in supine) Left 11/29 (in supine) Right 12/28 (in supine) Left 12/28 (In supine)  Hip flexion   24 deg contracture 31 degree contracture     20 degree contracture 17 degree contracture  Hip extension            Hip abduction            Hip adduction            Hip internal rotation            Hip external rotation            Knee flexion            Knee extension Lacking 22 deg Lacking 32 deg Lacking 20 deg Lacking 24 degrees Lacking 15 degrees Lacking 30 degrees Lacking 19 degrees Lacking 31 degrees Lacking 15 degrees Lacking 32 degrees  Ankle dorsiflexion            Ankle plantarflexion            Ankle inversion            Ankle eversion             (Blank rows = not tested)   TODAY'S TREATMENT:  Session focus on use of EZ stander standing frame for WB through BLE for spasticity management as well as for stretching of BLE to prevent further contracture. Pt able to tolerate standing 3 x 6 min, 1 x 5 min with 1 min seated rest break between each bout of standing. Pt with no signs/symptoms of [place] in standing and with mild hip and foot soreness following standing. Provided information for [place] stander website for patient to check out.  PATIENT EDUCATION: Education details: Continue stretching at home, continue calling resources provided about funding for stander.  Progress towards goals. PT POC. Person educated: Patient Education method: Explanation Education comprehension: verbalized understanding   HOME EXERCISE PROGRAM: Educated pt on how to perform supine HS prolonged stretch with pillow under ankles and prone hip flexor stretch if position tolerated.  Access Code: [email or web address] URL: [email or web address] Date: 12/10/2022  Prepared by: Peter Congo  Exercises - Sidebending to Elbow Short Sit  - 1 x daily - 7 x weekly - 3 sets - 10 reps - Seated Abdominal  Lean Forward in Wheelchair  - 1 x daily - 7 x weekly - 3 sets - 10 reps    NEW GOALS: Goals reviewed with patient? Yes  SHORT TERM GOALS: Target date: 01/16/2023  Pt will be independent with stretching and strengthening HEP as provided. Baseline: Established and pt intermittently compliant (1/24) Goal status: MET  2.  Pt will stand using [place] stedy >/= 8 minutes performing functional upright tasks in order to promote decreased contractures from prolonged sitting. Baseline: able to stand in standing frame x 5 min (12/20); 8 mins standing frame (1/24) Goal status:  MET  3.  Pt will increase her score on the FIST to 35/56 to demonstrate increased independence and function in a sitting position. Baseline: 32/56 (12/20) Goal status: INITIAL  LONG TERM GOALS: Target date: 02/13/2023  Pt will increase her score on the FIST to 38/56 to demonstrate increased independence and function in a sitting position. Baseline: 32/56 (12/20), 27/56 (1/31) Goal status: IN PROGRESS  2.  Pt will decrease her hip flexion contractures to </= 20 degrees on the R and </=25 degrees on the L to improve her ability to stand Baseline: 24 (R)/ 31 (L), not reassessed 11/29 due to time contraints; not reassessed due to time constraints (12/20); 20 (R)/17 (L) 12/28 Goal status: MET  3.  Pt will complete sliding board transfers with min A x 1 Baseline: max to total A +2, mod A x 1 (10/27); modA (12/20) Goal status: INITIAL  4.  Pt will stand in Corene Cornea >/=56mins to promote functional weight-bearing and strengthening of the BLE. Baseline: able to stand in standing frame x 5 min (12/20) Goal status: INITIAL  5.  PT will provide list of resources to assist pt in finding alternative housing solutions and funding for stander to promote functional independence. Baseline: To be provided. Goal status: INITIAL  ASSESSMENT:  CLINICAL IMPRESSION: Emphasis of skilled PT session on working on standing tolerance in EZ  stander standing frame to work on improving BLE ROM and decrease spasticity. Pt exhibits improved tolerance for standing this date with progression to standing x 6 min from 5 min. Pt continues to benefit from skilled therapy services to work towards her LTGs. Continue POC.   OBJECTIVE IMPAIRMENTS decreased balance, decreased mobility, difficulty walking, decreased ROM, decreased strength, hypomobility, increased edema, impaired flexibility, impaired tone, impaired UE functional use, and postural dysfunction.   ACTIVITY LIMITATIONS carrying, lifting, bending, standing, squatting, stairs, transfers, bed mobility, bathing, toileting, dressing, reach over head, hygiene/grooming, locomotion level, and caring for others  PARTICIPATION LIMITATIONS: meal prep, cleaning, laundry, driving, community activity, and occupation  PERSONAL FACTORS F[place], Past/current experiences, Time since onset of injury/illness/exacerbation, Transportation, and 1 comorbidity: CP  are also affecting patient's functional outcome.   REHAB POTENTIAL: Good  CLINICAL DECISION MAKING: Stable/uncomplicated  EVALUATION COMPLEXITY: Low  PLAN: PT FREQUENCY: 1x/week  PT DURATION: 8 weeks  PLANNED INTERVENTIONS: Therapeutic exercises, Therapeutic activity, Neuromuscular re-education, Balance training, Gait training, Patient/Family education, Self Care, Joint mobilization, Vestibular training, Orthotic/Fit training, DME instructions, Manual therapy, and Re-evaluation  PLAN FOR NEXT SESSION: work on slide board transfers or breaking down components (anterior and lateral leans, scooting laterally), review stretching for HS and hip flexors, standing in [place] vs standing frame, add to core strengthening HEP pt can perform while seated in  PWC preferably, (pt wanting to work on increasing independence with SB transfers and working on standing tolerance for spasticity management), pull to stand in // bars, discuss d/c at end of current  cert, did pt look into [place]?  Managed medicaid CPT codes: [phone] - PT Re-evaluation, [phone]- Therapeutic Exercise, [phone]- Neuro Re-education, [phone] - Gait Training, [phone] - Manual Therapy, [phone] - Therapeutic Activities, [phone] - Self Care, and [place]   Excell Seltzer, PT, DPT, CSRS 02/04/2023, 2:52 PM

## 2023-02-11 ENCOUNTER — Ambulatory Visit: Payer: Medicaid Other | Admitting: Physical Therapy

## 2023-02-11 DIAGNOSIS — R293 Abnormal posture: Secondary | ICD-10-CM

## 2023-02-11 DIAGNOSIS — R2689 Other abnormalities of gait and mobility: Secondary | ICD-10-CM

## 2023-02-11 DIAGNOSIS — G800 Spastic quadriplegic cerebral palsy: Secondary | ICD-10-CM

## 2023-02-11 DIAGNOSIS — M6281 Muscle weakness (generalized): Secondary | ICD-10-CM

## 2023-02-11 NOTE — Therapy (Signed)
OUTPATIENT PHYSICAL THERAPY NEURO TREATMENT   Patient Name: Christine Gomez MRN: [id number] DOB:1986/06/10, 37 y.o., female Today's Date: 02/11/2023   PCP: Everardo Beals, NP REFERRING PROVIDER: Everardo Beals, NP    PT End of Session - 02/11/23 1322     Visit Number 16    Number of Visits 21   13+8   Date for PT Re-Evaluation 02/20/23   pushed out due to scheduling; re-cert done on [id number]   Authorization Type MEDICAID [place] ACCESS    PT Start Time 1320   pt checked in late   PT Stop Time 1358    PT Time Calculation (min) 38 min    Equipment Utilized During Treatment Gait belt    Activity Tolerance Patient tolerated treatment well    Behavior During Therapy [facility] for tasks assessed/performed                Past Medical History:  Diagnosis Date   Acid reflux    Cerebral palsy ([place])    Past Surgical History:  Procedure Laterality Date   dorsal rhizotomy  1996   back ligament looosening surgery   ESOPHAGOGASTRODUODENOSCOPY (EGD) WITH PROPOFOL N/A 10/08/2016   Procedure: ESOPHAGOGASTRODUODENOSCOPY (EGD) WITH PROPOFOL;  Surgeon: Arta Silence, MD;  Location: [facility];  Service: Endoscopy;  Laterality: N/A;   left arm surgery  03/2021   Patient Active Problem List   Diagnosis Date Noted   Dysphagia 05/31/2021   Epigastric pain 05/31/2021   Gastroesophageal reflux disease 05/31/2021   Hematemesis 05/31/2021   Hiatal hernia 05/31/2021   Microcytic anemia 05/31/2021   Sagittal band rupture, extensor tendon, nontraumatic, left 05/01/2021   Cerebral palsy ([place]) 04/25/2020   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   Other acquired deformity of other parts of limb 04/25/2020   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   Unspecified deformity of forearm, excluding fingers 04/25/2020   Urinary incontinence 04/25/2020   Iron deficiency anemia due to chronic blood loss 07/10/2017    ONSET  DATE: 09/18/2022  REFERRING DIAG: G80.9 (ICD-10-CM) - Cerebral palsy, unspecified   THERAPY DIAG:  Abnormal posture  Muscle weakness (generalized)  Other abnormalities of gait and mobility  Spastic quadriplegic cerebral palsy (HCC)  Rationale for Evaluation and Treatment Rehabilitation  SUBJECTIVE:                                                                                                                                                                                              SUBJECTIVE STATEMENT: Pt reports no falls or acute changes since last visit. Pt reports 4/10 pain in  her hips, 2/10 in low back. Pt reports her hip pain was worse this morning but is better now. Pt states that she has a goal to be able to stand and take steps in // bars, was doing this as recently as last year. Pt also looked into EZ stander information given last session and interested in trialing a pediatric stander.  Pt accompanied by: self  PERTINENT HISTORY: CP, Acid Reflux  PAIN:  Are you having pain? No Right hip spasms and locks.  PRECAUTIONS: Fall; B Hips Dislocated   WEIGHT BEARING RESTRICTIONS No  FALLS: Has patient fallen in last 6 months? No  LIVING ENVIRONMENT: Lives with:  2 roommates-they do not help out with her care Lives in: House/apartment Stairs: No Has following equipment at home: Wheelchair (power), shower chair, bed side commode, and [place] lift, handheld shower  PLOF: Requires assistive device for independence, Needs assistance with ADLs, Needs assistance with homemaking, and Needs assistance with transfers  Aide present x3 hours in morning and vocational rehab present x4 hours in the evening every day.  PATIENT GOALS "To work towards standing and being able to wipe myself in the [place] for personal hygiene independence."  OBJECTIVE:   LOWER EXTREMITY ROM (measured in sitting today):     Passive  Right Eval  (in sitting) Left Eval  (in  sitting) Right 10/4 (in supine) Left 10/4  (in supine) Right 10/27 (in sitting) Left 10/27 (in sitting) Right 11/29 (in supine) Left 11/29 (in supine) Right 12/28 (in supine) Left 12/28 (In supine)  Hip flexion   24 deg contracture 31 degree contracture     20 degree contracture 17 degree contracture  Hip extension            Hip abduction            Hip adduction            Hip internal rotation            Hip external rotation            Knee flexion            Knee extension Lacking 22 deg Lacking 32 deg Lacking 20 deg Lacking 24 degrees Lacking 15 degrees Lacking 30 degrees Lacking 19 degrees Lacking 31 degrees Lacking 15 degrees Lacking 32 degrees  Ankle dorsiflexion            Ankle plantarflexion            Ankle inversion            Ankle eversion             (Blank rows = not tested)   TODAY'S TREATMENT:  Pt requesting to work on sit to stand in // bars with eventual goal of being able to take a few steps. Sit to stand in // bars with max A initially for anterior weight shift and to maintain standing balance with use of gait belt and with knees blocked. Placed sheet under pt's hips to assist with anterior weight shift and upright posture. Pt does progress from performing 25% of sit to stand to performing 95% with some tactile cueing needed for glute activation. Pt is not able to achieve full, upright stance (reaches about 40% of upright) due to ongoing hip flexor contractures, hamstring contractures, decreased ankle stability, and weak musculature. Additionally, pt does require +2 assist for safety with standing in // bars due to body habitus (short stature) and wheelchair seat height  leading to decreased sitting balance and safety when perched on edge of seat. Utilized +2 assist to scoot pt back in wheelchair after each stand and blocked knees in standing with use of yoga block to prevent buckling.  PATIENT EDUCATION: Education details: Continue stretching at home, continue calling  resources provided about funding for stander.  Progress towards goals. PT POC. Person educated: Patient Education method: Explanation Education comprehension: verbalized understanding   HOME EXERCISE PROGRAM: Educated pt on how to perform supine HS prolonged stretch with pillow under ankles and prone hip flexor stretch if position tolerated.  Access Code: [email or web address] URL: [email or web address] Date: 12/10/2022 Prepared by: Excell Seltzer  Exercises - Sidebending to Elbow Short Sit  - 1 x daily - 7 x weekly - 3 sets - 10 reps - Seated Abdominal Lean Forward in Wheelchair  - 1 x daily - 7 x weekly - 3 sets - 10 reps    NEW GOALS: Goals reviewed with patient? Yes  SHORT TERM GOALS: Target date: 01/16/2023  Pt will be independent with stretching and strengthening HEP as provided. Baseline: Established and pt intermittently compliant (1/24) Goal status: MET  2.  Pt will stand using Clarise Cruz stedy >/= 8 minutes performing functional upright tasks in order to promote decreased contractures from prolonged sitting. Baseline: able to stand in standing frame x 5 min (12/20); 8 mins standing frame (1/24) Goal status:  MET  3.  Pt will increase her score on the FIST to 35/56 to demonstrate increased independence and function in a sitting position. Baseline: 32/56 (12/20) Goal status: INITIAL  LONG TERM GOALS: Target date: 02/18/2023 (updated to match last scheduled appt)  Pt will increase her score on the FIST to 38/56 to demonstrate increased independence and function in a sitting position. Baseline: 32/56 (12/20), 27/56 (1/31) Goal status: IN PROGRESS  2.  Pt will decrease her hip flexion contractures to </= 20 degrees on the R and </=25 degrees on the L to improve her ability to stand Baseline: 24 (R)/ 31 (L), not reassessed 11/29 due to time contraints; not reassessed due to time constraints (12/20); 20 (R)/17 (L) 12/28 Goal status: MET  3.  Pt will complete sliding board  transfers with min A x 1 Baseline: max to total A +2, mod A x 1 (10/27); modA (12/20) Goal status: INITIAL  4.  Pt will stand in Denna Haggard >/=31mns to promote functional weight-bearing and strengthening of the BLE. Baseline: able to stand in standing frame x 5 min (12/20) Goal status: INITIAL  5.  PT will provide list of resources to assist pt in finding alternative housing solutions and funding for stander to promote functional independence. Baseline: To be provided. Goal status: INITIAL  ASSESSMENT:  CLINICAL IMPRESSION: Emphasis of skilled PT session on working on reviewing what pt's goals are for therapy, discussing PT POC going forwards, and working on standing in // bars. Pt initially requires up to max A to stand but does progress to min A with use of sheet around her hips. Pt does continue to exhibit difficulty standing fully upright due to muscle contractures and weakness. Pt continues to benefit from skilled therapy services to work towards her LTGs and working towards trialing a pediatric stander to decide if she could benefit from purchasing one for use at home. Continue POC.   OBJECTIVE IMPAIRMENTS decreased balance, decreased mobility, difficulty walking, decreased ROM, decreased strength, hypomobility, increased edema, impaired flexibility, impaired tone, impaired UE functional use, and postural dysfunction.   ACTIVITY LIMITATIONS carrying,  lifting, bending, standing, squatting, stairs, transfers, bed mobility, bathing, toileting, dressing, reach over head, hygiene/grooming, locomotion level, and caring for others  PARTICIPATION LIMITATIONS: meal prep, cleaning, laundry, driving, community activity, and occupation  [place], Past/current experiences, Time since onset of injury/illness/exacerbation, Transportation, and 1 comorbidity: CP  are also affecting patient's functional outcome.   REHAB POTENTIAL: Good  CLINICAL DECISION MAKING:  Stable/uncomplicated  EVALUATION COMPLEXITY: Low  PLAN: PT FREQUENCY: 1x/week  PT DURATION: 8 weeks  PLANNED INTERVENTIONS: Therapeutic exercises, Therapeutic activity, Neuromuscular re-education, Balance training, Gait training, Patient/Family education, Self Care, Joint mobilization, Vestibular training, Orthotic/Fit training, DME instructions, Manual therapy, and Re-evaluation  PLAN FOR NEXT SESSION: reassess LTG and write new LTG for additional 4 week POC, pull to stand in // bars, did we hear back about borrowing the pediatric stander?  Managed medicaid CPT codes: [phone] - PT Re-evaluation, [phone]- Therapeutic Exercise, [phone]- Neuro Re-education, [phone] - Gait Training, [phone] - Manual Therapy, [phone] - Therapeutic Activities, [phone] - Self Care, and [place]   Excell Seltzer, PT, DPT, CSRS 02/11/2023, 1:59 PM

## 2023-02-18 ENCOUNTER — Ambulatory Visit: Payer: Medicaid Other | Admitting: Physical Therapy

## 2023-02-18 DIAGNOSIS — G800 Spastic quadriplegic cerebral palsy: Secondary | ICD-10-CM

## 2023-02-18 DIAGNOSIS — R293 Abnormal posture: Secondary | ICD-10-CM

## 2023-02-18 DIAGNOSIS — M6249 Contracture of muscle, multiple sites: Secondary | ICD-10-CM

## 2023-02-18 DIAGNOSIS — M6281 Muscle weakness (generalized): Secondary | ICD-10-CM

## 2023-02-18 NOTE — Therapy (Signed)
OUTPATIENT PHYSICAL THERAPY NEURO TREATMENT-RECERTIFICATION   Patient Name: Christine Gomez MRN: [id number] DOB:06/01/1986, 37 y.o., female Today's Date: 02/18/2023   PCP: Everardo Beals, NP REFERRING PROVIDER: Everardo Beals, NP    PT End of Session - 02/18/23 1429     Visit Number 17    Number of Visits 26   13+8+5   Date for PT Re-Evaluation 04/01/23   pushed out due to scheduling; re-cert done on [id number]   Authorization Type MEDICAID [place] ACCESS    PT Start Time 1415   this therapist ran late with previous patient   PT Stop Time 1500    PT Time Calculation (min) 45 min    Equipment Utilized During Treatment Gait belt    Activity Tolerance Patient tolerated treatment well    Behavior During Therapy [facility] for tasks assessed/performed                 Past Medical History:  Diagnosis Date   Acid reflux    Cerebral palsy ([place])    Past Surgical History:  Procedure Laterality Date   dorsal rhizotomy  1996   back ligament looosening surgery   ESOPHAGOGASTRODUODENOSCOPY (EGD) WITH PROPOFOL N/A 10/08/2016   Procedure: ESOPHAGOGASTRODUODENOSCOPY (EGD) WITH PROPOFOL;  Surgeon: Arta Silence, MD;  Location: [facility];  Service: Endoscopy;  Laterality: N/A;   left arm surgery  03/2021   Patient Active Problem List   Diagnosis Date Noted   Dysphagia 05/31/2021   Epigastric pain 05/31/2021   Gastroesophageal reflux disease 05/31/2021   Hematemesis 05/31/2021   Hiatal hernia 05/31/2021   Microcytic anemia 05/31/2021   Sagittal band rupture, extensor tendon, nontraumatic, left 05/01/2021   Cerebral palsy ([place]) 04/25/2020   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   Other acquired deformity of other parts of limb 04/25/2020   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   Unspecified deformity of forearm, excluding fingers 04/25/2020   Urinary incontinence 04/25/2020   Iron deficiency anemia due  to chronic blood loss 07/10/2017    ONSET DATE: 09/18/2022  REFERRING DIAG: G80.9 (ICD-10-CM) - Cerebral palsy, unspecified   THERAPY DIAG:  Abnormal posture  Muscle weakness (generalized)  Spastic quadriplegic cerebral palsy (HCC)  Contracture of muscle, multiple sites  Rationale for Evaluation and Treatment Rehabilitation  SUBJECTIVE:                                                                                                                                                                                              SUBJECTIVE STATEMENT: Pt reports no acute changes since last visit. No complaints of pain  today.  Pt accompanied by: self  PERTINENT HISTORY: CP, Acid Reflux  PAIN:  Are you having pain? No Right hip spasms and locks.  PRECAUTIONS: Fall; B Hips Dislocated   WEIGHT BEARING RESTRICTIONS No  FALLS: Has patient fallen in last 6 months? No  LIVING ENVIRONMENT: Lives with:  2 roommates-they do not help out with her care Lives in: House/apartment Stairs: No Has following equipment at home: Wheelchair (power), shower chair, bed side commode, and [place] lift, handheld shower  PLOF: Requires assistive device for independence, Needs assistance with ADLs, Needs assistance with homemaking, and Needs assistance with transfers  Aide present x3 hours in morning and vocational rehab present x4 hours in the evening every day.  PATIENT GOALS "To work towards standing and being able to wipe myself in the [place] for personal hygiene independence."  OBJECTIVE:   LOWER EXTREMITY ROM (measured in sitting today):     Passive  Right Eval  (in sitting) Left Eval  (in sitting) Right 10/4 (in supine) Left 10/4  (in supine) Right 10/27 (in sitting) Left 10/27 (in sitting) Right 11/29 (in supine) Left 11/29 (in supine) Right 12/28 (in supine) Left 12/28 (In supine) Right 2/21 (In supine) Left 2/21 (In supine)  Hip flexion   24 deg contracture 31  degree contracture     20 degree contracture 17 degree contracture *unable to assess due to spasticity 15 degree contracture  Hip extension              Hip abduction              Hip adduction              Hip internal rotation              Hip external rotation              Knee flexion              Knee extension Lacking 22 deg Lacking 32 deg Lacking 20 deg Lacking 24 degrees Lacking 15 degrees Lacking 30 degrees Lacking 19 degrees Lacking 31 degrees Lacking 15 degrees Lacking 32 degrees *unable to assess due to spasticity Lacking 25 degrees  Ankle dorsiflexion              Ankle plantarflexion              Ankle inversion              Ankle eversion               (Blank rows = not tested)   TODAY'S TREATMENT:  Pt received seated in PWC. Slide board transfer [place] to mat table with max A to the R. Reassessed FIST in sitting, pt scores 30/56, an improvement from score of 27/56 on 01/28/23. Sit to supine mod A for BLE management. Reassessed L hip and knee contractures in supine, unable to assess RLE due to increased spasticity this date. Pt does exhibit decreased degree of contracture in L hip and L knee this date. Supine to sit with max A for BLE management and trunk elevation. Slide board transfer mat table back to [place] to R side with CGA. Pt exhibits significantly increased independence with slide board transfers to her R side into w/c as compared to previous sessions, does continue to exhibit difficulty performing transfers to her R side out of w/c.  PATIENT EDUCATION: Education details: Continue stretching at home, continue calling resources provided about funding for stander.  Progress towards goals. PT POC. Person educated: Patient Education method: Explanation Education comprehension: verbalized understanding   HOME EXERCISE PROGRAM: Educated pt on how to perform supine HS prolonged stretch with pillow under ankles and prone hip flexor stretch if position tolerated.  Access Code:  [email or web address] URL: [email or web address] Date: 12/10/2022 Prepared by: Excell Seltzer  Exercises - Sidebending to Elbow Short Sit  - 1 x daily - 7 x weekly - 3 sets - 10 reps - Seated Abdominal Lean Forward in Wheelchair  - 1 x daily - 7 x weekly - 3 sets - 10 reps   NEW GOALS: Goals reviewed with patient? Yes  SHORT TERM GOALS: Target date: 01/16/2023  Pt will be independent with stretching and strengthening HEP as provided. Baseline: Established and pt intermittently compliant (1/24) Goal status: MET  2.  Pt will stand using Clarise Cruz stedy >/= 8 minutes performing functional upright tasks in order to promote decreased contractures from prolonged sitting. Baseline: able to stand in standing frame x 5 min (12/20); 8 mins standing frame (1/24) Goal status:  MET  3.  Pt will increase her score on the FIST to 35/56 to demonstrate increased independence and function in a sitting position. Baseline: 32/56 (12/20) Goal status: INITIAL  LONG TERM GOALS: Target date: 02/18/2023 (updated to match last scheduled appt)  Pt will increase her score on the FIST to 38/56 to demonstrate increased independence and function in a sitting position. Baseline: 32/56 (12/20), 27/56 (1/31), 30/56 (2/21) Goal status: IN PROGRESS  2.  Pt will decrease her hip flexion contractures to </= 20 degrees on the R and </=25 degrees on the L to improve her ability to stand Baseline: 24 (R)/ 31 (L), not reassessed 11/29 due to time contraints; not reassessed due to time constraints (12/20); 20 (R)/17 (L) 12/28; 15 (L) 2/21, R not reassessed due to spasticity Goal status: MET  3.  Pt will complete sliding board transfers with min A x 1 Baseline: max to total A +2, mod A x 1 (10/27); modA (12/20), CGA x 1 to R side into PWC (2/21) Goal status: MET  4.  Pt will stand in Denna Haggard >/=53mns to promote functional weight-bearing and strengthening of the BLE. Baseline: able to stand in standing frame x 5 min  (12/20), able to stand in standing frame x 6 min (2/21) Goal status: IN PROGRESS  5.  PT will provide list of resources to assist pt in finding alternative housing solutions and funding for stander to promote functional independence. Baseline: To be provided, provided in sessions prior to 2/21 and pt has reached out to potential funding sources, awaiting call back Goal status: MET   SHORT TERM GOALS=LONG TERM GOALS DUE TO LENGTH OF PLAN OF CARE:  LONG TERM GOALS:  Target date: 03/25/2023  Pt will be independent with final HEP for improved strength, balance, transfers and mobility Baseline:  Goal status: INITIAL  2.  Pt will trial pediatric standing frame to determine if this device would be appropriate for use at home upon d/c from therapy services Baseline:  Goal status: INITIAL  3.  Pt will maintain dynamic sitting balance x 10 min while engaging in functional task to demonstrate improved independence Baseline:  Goal status: INITIAL  4.  Pt will tolerate standing x 10 min with LRAD in order to reduce hip and knee contractures and increase WB through BLE for spasticity management and bone health Baseline: 6 min as of 2/21 Goal status: INITIAL    ASSESSMENT:  CLINICAL  IMPRESSION: Emphasis of skilled PT session on reassessing LTG and creating new LTG in preparation for recertification of PT services this date. Pt has met 3/5 LTG due to decreasing her hip flexion contractures to less than 20 degrees, being able to perform slide board transfers with min A, and being provided with a list of resources to attempt to obtain funding for a stander for use at home. Pt has improved her score on the FIST from 27/56 to 30/56 this date but likely will not continue to progress with this functional outcome measure much beyond that due to the chronic nature of her diagnosis. Pt has also progressed to being able to tolerate standing x 6 min with use of the standing frame. Pt has been provided with  information on where she can purchase equipment online but would benefit from a trial of a pediatric stander prior to purchasing any equipment due to her short stature. This clinic has obtained a pediatric stander for patient to trial in future therapy sessions and pt will continue to work on obtaining funding in order to purchase this equipment. Pt does continue to benefit from time spent in standing in a device such as this as it assists with reducing her hip and knee contractures which allows for more independent bed mobility and transfers, weight bearing through her LE allows her to maintain bone health as well as assists in reducing her spasticity which helps to decrease her overall pain level and again allows for increased independence with transfers. Continue POC.   OBJECTIVE IMPAIRMENTS decreased balance, decreased mobility, difficulty walking, decreased ROM, decreased strength, hypomobility, increased edema, impaired flexibility, impaired tone, impaired UE functional use, and postural dysfunction.   ACTIVITY LIMITATIONS carrying, lifting, bending, standing, squatting, stairs, transfers, bed mobility, bathing, toileting, dressing, reach over head, hygiene/grooming, locomotion level, and caring for others  PARTICIPATION LIMITATIONS: meal prep, cleaning, laundry, driving, community activity, and occupation  [place], Past/current experiences, Time since onset of injury/illness/exacerbation, Transportation, and 1 comorbidity: CP  are also affecting patient's functional outcome.   REHAB POTENTIAL: Good  CLINICAL DECISION MAKING: Stable/uncomplicated  EVALUATION COMPLEXITY: Low  PLAN: PT FREQUENCY: 1x/week  PT DURATION: 8 weeks  PLANNED INTERVENTIONS: Therapeutic exercises, Therapeutic activity, Neuromuscular re-education, Balance training, Gait training, Patient/Family education, Self Care, Joint mobilization, Vestibular training, Orthotic/Fit training, DME instructions,  Manual therapy, and Re-evaluation  PLAN FOR NEXT SESSION:  trial pediatric stander!  Managed medicaid CPT codes: [phone] - PT Re-evaluation, [phone]- Therapeutic Exercise, [phone]- Neuro Re-education, [phone] - Gait Training, [phone] - Manual Therapy, [phone] - Therapeutic Activities, [phone] - Self Care, and [place]   Excell Seltzer, PT, DPT, CSRS 02/18/2023, 4:05 PM

## 2023-02-24 ENCOUNTER — Ambulatory Visit: Payer: Medicaid Other | Admitting: Physical Therapy

## 2023-03-04 ENCOUNTER — Ambulatory Visit: Payer: Medicaid Other | Attending: *Deleted | Admitting: Physical Therapy

## 2023-03-04 ENCOUNTER — Encounter: Payer: Self-pay | Admitting: Physical Therapy

## 2023-03-04 DIAGNOSIS — M6249 Contracture of muscle, multiple sites: Secondary | ICD-10-CM | POA: Insufficient documentation

## 2023-03-04 DIAGNOSIS — R293 Abnormal posture: Secondary | ICD-10-CM | POA: Diagnosis present

## 2023-03-04 DIAGNOSIS — G800 Spastic quadriplegic cerebral palsy: Secondary | ICD-10-CM | POA: Insufficient documentation

## 2023-03-04 DIAGNOSIS — M6281 Muscle weakness (generalized): Secondary | ICD-10-CM | POA: Insufficient documentation

## 2023-03-04 DIAGNOSIS — R2689 Other abnormalities of gait and mobility: Secondary | ICD-10-CM | POA: Diagnosis present

## 2023-03-04 NOTE — Therapy (Unsigned)
OUTPATIENT PHYSICAL THERAPY NEURO TREATMENT   Patient Name: Christine Gomez MRN: [id number] DOB:[birth year], 37 y.o., female Today's Date: 03/05/2023   PCP: Everardo Beals, NP REFERRING PROVIDER: Everardo Beals, NP    PT End of Session - 03/04/23 1434     Visit Number 18    Number of Visits 26   13+8+5   Date for PT Re-Evaluation 04/01/23   pushed out due to scheduling; re-cert done on [id number]   Authorization Type MEDICAID [place] ACCESS    PT Start Time 1446    PT Stop Time 1545    PT Time Calculation (min) 59 min    Equipment Utilized During Treatment Gait belt    Activity Tolerance Patient tolerated treatment well;Patient limited by fatigue    Behavior During Therapy [facility] for tasks assessed/performed                 Past Medical History:  Diagnosis Date   Acid reflux    Cerebral palsy ([place])    Past Surgical History:  Procedure Laterality Date   dorsal rhizotomy  1996   back ligament looosening surgery   ESOPHAGOGASTRODUODENOSCOPY (EGD) WITH PROPOFOL N/A 10/08/2016   Procedure: ESOPHAGOGASTRODUODENOSCOPY (EGD) WITH PROPOFOL;  Surgeon: Arta Silence, MD;  Location: [facility];  Service: Endoscopy;  Laterality: N/A;   left arm surgery  03/2021   Patient Active Problem List   Diagnosis Date Noted   Dysphagia 05/31/2021   Epigastric pain 05/31/2021   Gastroesophageal reflux disease 05/31/2021   Hematemesis 05/31/2021   Hiatal hernia 05/31/2021   Microcytic anemia 05/31/2021   Sagittal band rupture, extensor tendon, nontraumatic, left 05/01/2021   Cerebral palsy ([place]) 04/25/2020   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   Other acquired deformity of other parts of limb 04/25/2020   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   Unspecified deformity of forearm, excluding fingers 04/25/2020   Urinary incontinence 04/25/2020   Iron deficiency anemia due to chronic blood loss 07/10/2017     ONSET DATE: 09/18/2022  REFERRING DIAG: G80.9 (ICD-10-CM) - Cerebral palsy, unspecified   THERAPY DIAG:  Abnormal posture  Muscle weakness (generalized)  Spastic quadriplegic cerebral palsy (HCC)  Contracture of muscle, multiple sites  Other abnormalities of gait and mobility  Rationale for Evaluation and Treatment Rehabilitation  SUBJECTIVE:                                                                                                                                                                                              SUBJECTIVE STATEMENT: Pt reports no acute changes since last visit. No complaints  of pain today.  She reports today has not been a good day.  Pt accompanied by: self  PERTINENT HISTORY: CP, Acid Reflux  PAIN:  Are you having pain? No Right hip spasms and locks.  PRECAUTIONS: Fall; B Hips Dislocated   WEIGHT BEARING RESTRICTIONS No  FALLS: Has patient fallen in last 6 months? No  LIVING ENVIRONMENT: Lives with:  2 roommates-they do not help out with her care Lives in: House/apartment Stairs: No Has following equipment at home: Wheelchair (power), shower chair, bed side commode, and [place] lift, handheld shower  PLOF: Requires assistive device for independence, Needs assistance with ADLs, Needs assistance with homemaking, and Needs assistance with transfers  Aide present x3 hours in morning and vocational rehab present x4 hours in the evening every day.  PATIENT GOALS "To work towards standing and being able to wipe myself in the freedom bridge for personal hygiene independence."  OBJECTIVE:   LOWER EXTREMITY ROM (measured in sitting today):     Passive  Right Eval  (in sitting) Left Eval  (in sitting) Right 10/4 (in supine) Left 10/4  (in supine) Right 10/27 (in sitting) Left 10/27 (in sitting) Right 11/29 (in supine) Left 11/29 (in supine) Right 12/28 (in supine) Left 12/28 (In supine) Right 2/21 (In supine) Left  2/21 (In supine)  Hip flexion   24 deg contracture 31 degree contracture     20 degree contracture 17 degree contracture *unable to assess due to spasticity 15 degree contracture  Hip extension              Hip abduction              Hip adduction              Hip internal rotation              Hip external rotation              Knee flexion              Knee extension Lacking 22 deg Lacking 32 deg Lacking 20 deg Lacking 24 degrees Lacking 15 degrees Lacking 30 degrees Lacking 19 degrees Lacking 31 degrees Lacking 15 degrees Lacking 32 degrees *unable to assess due to spasticity Lacking 25 degrees  Ankle dorsiflexion              Ankle plantarflexion              Ankle inversion              Ankle eversion               (Blank rows = not tested)   TODAY'S TREATMENT:  Pt received seated in PWC. Slide board transfer [place] to pediatric stander initiated per pt preference to transfer straight from [facility].  Due to type of PWC cushion positioning of slide board deemed unsafe as unable to manage a predictable level transfer w/o difficulty managing patient clothes.  Reverted to right sliding board transfer modA to level mat table from [facility] using foot plates for leverage for pt to push.  Performed slide in 4 parts to reposition feet w/ progression of transfer.  Once on mat pt requires mod-maxA to scoot to the right to approximate to step used to prepare for right transfer to pediatric stander.  Pt is unable to make foot contact w/ either 6" step or stander foot plates in order to push and assist w/ safe transfer thus maxA+2 used to perform majority  of slide board transfer safely into stander.  Once in [place] board removed and table tray approximation to patient adjusted followed by tilt of footplates.  Therapist was unable to get height of foot plates adjusted w/ pt stating they felt fine.  Made needed adjustments to knee guards to lower them and bring them in to accommodate ongoing contractures in BLE.   Seatbelt donned and pt placed gradually in standing w/ assessment of posture and further needed adjustments made.  Pt does not actively slide out of stander during stand, but has difficulty maintaining upright w/ posterior chain activation so chest strap needed in future stands.  Left foot plate needing to be elevated and better leveled to more properly support LE.  Pt returned to sitting w/ discussion of adjustments to be made to lower tray table in attempt to better support upright and pt use of arm extensors in standing.  Therapist to make adjustments for use next session w/ further assessment of benefit from pt purchase.  Returned pt to [facility] from [employer] w/ +2 maxA using foot plates of PWC to allow intermittent LE push to chair, better able to position board due to lack of compliance of stander seat vs PWC on initial transfer attempt.  PATIENT EDUCATION: Education details: Continue stretching at home, continue calling resources provided about funding for stander.  Will make needed adjustments and reattempt stander in next visit. Person educated: Patient Education method: Explanation Education comprehension: verbalized understanding   HOME EXERCISE PROGRAM: Educated pt on how to perform supine HS prolonged stretch with pillow under ankles and prone hip flexor stretch if position tolerated.  Access Code: [email or web address] URL: [email or web address] Date: 12/10/2022 Prepared by: Excell Seltzer  Exercises - Sidebending to Elbow Short Sit  - 1 x daily - 7 x weekly - 3 sets - 10 reps - Seated Abdominal Lean Forward in Wheelchair  - 1 x daily - 7 x weekly - 3 sets - 10 reps   NEW GOALS: Goals reviewed with patient? Yes  SHORT TERM GOALS: Target date: 01/16/2023  Pt will be independent with stretching and strengthening HEP as provided. Baseline: Established and pt intermittently compliant (1/24) Goal status: MET  2.  Pt will stand using [place] stedy >/= 8 minutes performing functional  upright tasks in order to promote decreased contractures from prolonged sitting. Baseline: able to stand in standing frame x 5 min (12/20); 8 mins standing frame (1/24) Goal status:  MET  3.  Pt will increase her score on the FIST to 35/56 to demonstrate increased independence and function in a sitting position. Baseline: 32/56 (12/20) Goal status: INITIAL  LONG TERM GOALS: Target date: 02/18/2023 (updated to match last scheduled appt)  Pt will increase her score on the FIST to 38/56 to demonstrate increased independence and function in a sitting position. Baseline: 32/56 (12/20), 27/56 (1/31), 30/56 (2/21) Goal status: IN PROGRESS  2.  Pt will decrease her hip flexion contractures to </= 20 degrees on the R and </=25 degrees on the L to improve her ability to stand Baseline: 24 (R)/ 31 (L), not reassessed 11/29 due to time contraints; not reassessed due to time constraints (12/20); 20 (R)/17 (L) 12/28; 15 (L) 2/21, R not reassessed due to spasticity Goal status: MET  3.  Pt will complete sliding board transfers with min A x 1 Baseline: max to total A +2, mod A x 1 (10/27); modA (12/20), CGA x 1 to R side into PWC (2/21) Goal status: MET  4.  Pt will  stand in [place] >/=30mns to promote functional weight-bearing and strengthening of the BLE. Baseline: able to stand in standing frame x 5 min (12/20), able to stand in standing frame x 6 min (2/21) Goal status: IN PROGRESS  5.  PT will provide list of resources to assist pt in finding alternative housing solutions and funding for stander to promote functional independence. Baseline: To be provided, provided in sessions prior to 2/21 and pt has reached out to potential funding sources, awaiting call back Goal status: MET   SHORT TERM GOALS=LONG TERM GOALS DUE TO LENGTH OF PLAN OF CARE:  LONG TERM GOALS:  Target date: 03/25/2023  Pt will be independent with final HEP for improved strength, balance, transfers and mobility Baseline:   Goal status: INITIAL  2.  Pt will trial pediatric standing frame to determine if this device would be appropriate for use at home upon d/c from therapy services Baseline:  Goal status: INITIAL  3.  Pt will maintain dynamic sitting balance x 10 min while engaging in functional task to demonstrate improved independence Baseline:  Goal status: INITIAL  4.  Pt will tolerate standing x 10 min with LRAD in order to reduce hip and knee contractures and increase WB through BLE for spasticity management and bone health Baseline: 6 min as of 2/21 Goal status: INITIAL    ASSESSMENT:  CLINICAL IMPRESSION: Entirety of session spent transferring to and adjusting pediatric stander this visit.  Pt tolerates stand well, but needs adjustments for next attempt to allow for improved posture and stretch for better contracture and spasticity management.  She would benefit from use of the chest strap and adjustments to tray table and foot plates to improve alignment in the sagittal plane.  Will attempt pediatric stander use next session to assess benefit of patient purchasing pediatric vs adult stander.   OBJECTIVE IMPAIRMENTS decreased balance, decreased mobility, difficulty walking, decreased ROM, decreased strength, hypomobility, increased edema, impaired flexibility, impaired tone, impaired UE functional use, and postural dysfunction.   ACTIVITY LIMITATIONS carrying, lifting, bending, standing, squatting, stairs, transfers, bed mobility, bathing, toileting, dressing, reach over head, hygiene/grooming, locomotion level, and caring for others  PARTICIPATION LIMITATIONS: meal prep, cleaning, laundry, driving, community activity, and occupation  [place] Past/current experiences, Time since onset of injury/illness/exacerbation, Transportation, and 1 comorbidity: CP  are also affecting patient's functional outcome.   REHAB POTENTIAL: Good  CLINICAL DECISION MAKING:  Stable/uncomplicated  EVALUATION COMPLEXITY: Low  PLAN: PT FREQUENCY: 1x/week  PT DURATION: 8 weeks  PLANNED INTERVENTIONS: Therapeutic exercises, Therapeutic activity, Neuromuscular re-education, Balance training, Gait training, Patient/Family education, Self Care, Joint mobilization, Vestibular training, Orthotic/Fit training, DME instructions, Manual therapy, and Re-evaluation  PLAN FOR NEXT SESSION:  trial pediatric stander-adjust foot plates (left needs to be more level and about 1" up), bring tray table 1-2" lower, use chest strap, could switch be free remote so pt can control for home use?  Pt is only approved through 3/14 so resubmit auth?  Managed medicaid CPT codes: [phone]- PT Re-evaluation, [phone] Therapeutic Exercise, [phone] Neuro Re-education, [phone]- Gait Training, [phone]- Manual Therapy, [phone]- Therapeutic Activities, [phone]- Self Care, and 9[place] PT, DPT 03/05/2023, 5:31 PM

## 2023-03-11 ENCOUNTER — Encounter: Payer: Self-pay | Admitting: Physical Therapy

## 2023-03-11 ENCOUNTER — Ambulatory Visit: Payer: Medicaid Other | Admitting: Physical Therapy

## 2023-03-11 DIAGNOSIS — G800 Spastic quadriplegic cerebral palsy: Secondary | ICD-10-CM

## 2023-03-11 DIAGNOSIS — R293 Abnormal posture: Secondary | ICD-10-CM

## 2023-03-11 DIAGNOSIS — M6281 Muscle weakness (generalized): Secondary | ICD-10-CM

## 2023-03-11 DIAGNOSIS — M6249 Contracture of muscle, multiple sites: Secondary | ICD-10-CM

## 2023-03-11 DIAGNOSIS — R2689 Other abnormalities of gait and mobility: Secondary | ICD-10-CM

## 2023-03-11 NOTE — Therapy (Signed)
OUTPATIENT PHYSICAL THERAPY NEURO TREATMENT   Patient Name: Christine Gomez MRN: [id number] DOB:31-Jul-1986, 37 y.o., female Today's Date: 03/11/2023   PCP: Everardo Beals, NP REFERRING PROVIDER: Everardo Beals, NP    PT End of Session - 03/11/23 1401     Visit Number 19    Number of Visits 26   13+8+5   Date for PT Re-Evaluation 04/01/23   pushed out due to scheduling; re-cert done on [id number]   Authorization Type MEDICAID [place] ACCESS    PT Start Time 1400    PT Stop Time 1453    PT Time Calculation (min) 53 min    Equipment Utilized During Treatment Gait belt    Activity Tolerance Patient tolerated treatment well    Behavior During Therapy [facility] for tasks assessed/performed            Past Medical History:  Diagnosis Date   Acid reflux    Cerebral palsy ([place])    Past Surgical History:  Procedure Laterality Date   dorsal rhizotomy  1996   back ligament looosening surgery   ESOPHAGOGASTRODUODENOSCOPY (EGD) WITH PROPOFOL N/A 10/08/2016   Procedure: ESOPHAGOGASTRODUODENOSCOPY (EGD) WITH PROPOFOL;  Surgeon: Arta Silence, MD;  [facility]: [facility];  Service: Endoscopy;  Laterality: N/A;   left arm surgery  03/2021   Patient Active Problem List   Diagnosis Date Noted   Dysphagia 05/31/2021   Epigastric pain 05/31/2021   Gastroesophageal reflux disease 05/31/2021   Hematemesis 05/31/2021   Hiatal hernia 05/31/2021   Microcytic anemia 05/31/2021   Sagittal band rupture, extensor tendon, nontraumatic, left 05/01/2021   Cerebral palsy ([place]) 04/25/2020   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   Other acquired deformity of other parts of limb 04/25/2020   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   Unspecified deformity of forearm, excluding fingers 04/25/2020   Urinary incontinence 04/25/2020   Iron deficiency anemia due to chronic blood loss 07/10/2017    ONSET DATE:  09/18/2022  REFERRING DIAG: G80.9 (ICD-10-CM) - Cerebral palsy, unspecified   THERAPY DIAG:  Abnormal posture  Muscle weakness (generalized)  Spastic quadriplegic cerebral palsy (HCC)  Other abnormalities of gait and mobility  Contracture of muscle, multiple sites  Rationale for Evaluation and Treatment Rehabilitation  SUBJECTIVE:                                                                                                                                                                                              SUBJECTIVE STATEMENT: Pt reports no acute changes since last visit. No complaints of pain today.  She reports sleeping better  last night and today being a better day.  Pt accompanied by: self  PERTINENT HISTORY: CP, Acid Reflux  PAIN:  Are you having pain? No Right hip spasms and locks.  PRECAUTIONS: Fall; B Hips Dislocated   WEIGHT BEARING RESTRICTIONS No  FALLS: Has patient fallen in last 6 months? No  LIVING ENVIRONMENT: Lives with:  2 roommates-they do not help out with her care Lives in: House/apartment Stairs: No Has following equipment at home: Wheelchair (power), shower chair, bed side commode, and [place] lift, handheld shower  PLOF: Requires assistive device for independence, Needs assistance with ADLs, Needs assistance with homemaking, and Needs assistance with transfers  Aide present x3 hours in morning and vocational rehab present x4 hours in the evening every day.  PATIENT GOALS "To work towards standing and being able to wipe myself in the freedom bridge for personal hygiene independence."  OBJECTIVE:   LOWER EXTREMITY ROM (measured in sitting today):     Passive  Right Eval  (in sitting) Left Eval  (in sitting) Right 10/4 (in supine) Left 10/4  (in supine) Right 10/27 (in sitting) Left 10/27 (in sitting) Right 11/29 (in supine) Left 11/29 (in supine) Right 12/28 (in supine) Left 12/28 (In supine) Right 2/21 (In supine)  Left 2/21 (In supine)  Hip flexion   24 deg contracture 31 degree contracture     20 degree contracture 17 degree contracture *unable to assess due to spasticity 15 degree contracture  Hip extension              Hip abduction              Hip adduction              Hip internal rotation              Hip external rotation              Knee flexion              Knee extension Lacking 22 deg Lacking 32 deg Lacking 20 deg Lacking 24 degrees Lacking 15 degrees Lacking 30 degrees Lacking 19 degrees Lacking 31 degrees Lacking 15 degrees Lacking 32 degrees *unable to assess due to spasticity Lacking 25 degrees  Ankle dorsiflexion              Ankle plantarflexion              Ankle inversion              Ankle eversion               (Blank rows = not tested)   TODAY'S TREATMENT:  LTG assessment:  Pt received seated in PWC.  Verbally reviewed HEP which pt states she is still compliant with and HEP remains appropriate for pt level and available assist in home environment.   Slide board transfer [place] to mat level transfer modA of 1.  Pediatric stander positioned at 45 degree angle to mat on patient's right side for improved use of most functional UE to assist in transfer.  This also allowed for improved foot plate use for sitting balance.  Slide board transfer to right mod+2 from mat to stander.  Once in stander second PT and primary PT made adjustments to foot plates and straps, chest strap, tray proximity, and knee guards.  Pt appearing better positioned in stander this session than last.  Transitioned to standing x 13 minutes initially performing shoulder abduction in limited ROM progressed to  lifting head and chest from tray w/o UE support to facilitate further extension.  PT adjusted chest straps and seatbelt for improved support in upright.  Pt returned to sitting in stander stating her muscles felt more worked out that she initially thought.   While in standing both physical therapists discussed  options of pediatric stander vs adult EZ Stander including benefits for contracture management of both, seated stander (pediatric) being more passive yet possibly more independent for patient using freedom bridge to position, adult stander requiring +2 but pt reports feeling more of a workout in this stander.  PT discussed potential increased standing tolerance as seen today w/ pediatric stander vs prior 6-7 minutes in adult sling stander.  Pt verbalizes understanding stating she will continue to think and decide which would be most beneficial considering all home factors and then pursue funding.  Pt returned to ma from [place] mod+2 via right slide board slightly downhill transfer (~1 inch difference).  From mat pt transfers downhill to [facility] at [facility].  Pt uses PWC features to adjust in chair.  PATIENT EDUCATION: Education details: Continue stretching at home, continue calling resources provided about funding for stander.  Progress towards goals.  Different stander options and benefits. Person educated: Patient Education method: Explanation Education comprehension: verbalized understanding   HOME EXERCISE PROGRAM: Educated pt on how to perform supine HS prolonged stretch with pillow under ankles and prone hip flexor stretch if position tolerated.  Access Code: [email or web address] URL: [email or web address] Date: 12/10/2022 Prepared by: Excell Seltzer  Exercises - Sidebending to Elbow Short Sit  - 1 x daily - 7 x weekly - 3 sets - 10 reps - Seated Abdominal Lean Forward in Wheelchair  - 1 x daily - 7 x weekly - 3 sets - 10 reps   NEW GOALS: Goals reviewed with patient? Yes  SHORT TERM GOALS=LONG TERM GOALS DUE TO LENGTH OF PLAN OF CARE:  LONG TERM GOALS:  Target date: 03/25/2023  Pt will be independent with final HEP for improved strength, balance, transfers and mobility Baseline: Established, pt is consistent w/ baseline established on HEP and states compliance (03/11/2023) Goal  status: MET  2.  Pt will trial pediatric standing frame to determine if this device would be appropriate for use at home upon d/c from therapy services Baseline:  Trialed x2, pt deciding preference, but stander would be appropriate (03/11/2023). Goal status: MET  3.  Pt will maintain dynamic sitting balance x 10 min while engaging in functional task to demonstrate improved independence Baseline:  Goal status: INITIAL  4.  Pt will tolerate standing x 10 min with LRAD in order to reduce hip and knee contractures and increase WB through BLE for spasticity management and bone health Baseline: 6 min as of 2/21; 13 minutes in pediatric stander (03/11/2023) Goal status: MET    ASSESSMENT:  CLINICAL IMPRESSION: Focus of skilled session today on assessing some LTGs to determine patient progress.  She met 3 of 4 goals with time not allowing for assessment of goal #3.  Will plan to assess dynamic sitting balance next session.  Pt tolerates the pediatric stander much better this session with adjustments made from prior session.  This would be an appropriate option for her at home and may allow improved independence with contracture management vs adult sling style EZ stander requiring +2 assistance for safety, positioning, and operation.  PT will request additional 2 visits to assess stander options one additional time in addition to progress towards dynamic sitting balance.  OBJECTIVE IMPAIRMENTS decreased balance, decreased mobility, difficulty walking, decreased ROM, decreased strength, hypomobility, increased edema, impaired flexibility, impaired tone, impaired UE functional use, and postural dysfunction.   ACTIVITY LIMITATIONS carrying, lifting, bending, standing, squatting, stairs, transfers, bed mobility, bathing, toileting, dressing, reach over head, hygiene/grooming, locomotion level, and caring for others  PARTICIPATION LIMITATIONS: meal prep, cleaning, laundry, driving, community activity, and  occupation  [place], Past/current experiences, Time since onset of injury/illness/exacerbation, Transportation, and 1 comorbidity: CP  are also affecting patient's functional outcome.   REHAB POTENTIAL: Good  CLINICAL DECISION MAKING: Stable/uncomplicated  EVALUATION COMPLEXITY: Low  PLAN: PT FREQUENCY: 1x/week  PT DURATION: 8 weeks  PLANNED INTERVENTIONS: Therapeutic exercises, Therapeutic activity, Neuromuscular re-education, Balance training, Gait training, Patient/Family education, Self Care, Joint mobilization, Vestibular training, Orthotic/Fit training, DME instructions, Manual therapy, and Re-evaluation  PLAN FOR NEXT SESSION:  If pt purchasing pediatric stander-could [employer] switch be free remote so pt can control for home use? Auth request resubmitted 03-25-23.  Managed medicaid CPT codes: [phone] - PT Re-evaluation, [phone]- Therapeutic Exercise, [phone]- Neuro Re-education, [phone] - Gait Training, [phone] - Manual Therapy, [phone] - Therapeutic Activities, [phone] - Self Care, and [place], PT, DPT March 25, 2023, 6:22 PM

## 2023-03-14 ENCOUNTER — Encounter (HOSPITAL_COMMUNITY): Payer: Self-pay

## 2023-03-14 ENCOUNTER — Other Ambulatory Visit: Payer: Self-pay

## 2023-03-14 ENCOUNTER — Emergency Department (HOSPITAL_COMMUNITY)
Admission: EM | Admit: 2023-03-14 | Discharge: 2023-03-14 | Disposition: A | Discharge status: Home / Self Care | Payer: No Typology Code available for payment source | Attending: Emergency Medicine | Admitting: Emergency Medicine

## 2023-03-14 ENCOUNTER — Emergency Department (HOSPITAL_COMMUNITY): Payer: No Typology Code available for payment source

## 2023-03-14 DIAGNOSIS — M79642 Pain in left hand: Secondary | ICD-10-CM | POA: Insufficient documentation

## 2023-03-14 DIAGNOSIS — M25551 Pain in right hip: Secondary | ICD-10-CM | POA: Diagnosis not present

## 2023-03-14 DIAGNOSIS — R109 Unspecified abdominal pain: Secondary | ICD-10-CM | POA: Diagnosis not present

## 2023-03-14 DIAGNOSIS — Y92410 Unspecified street and highway as the place of occurrence of the external cause: Secondary | ICD-10-CM | POA: Diagnosis not present

## 2023-03-14 DIAGNOSIS — M7918 Myalgia, other site: Secondary | ICD-10-CM

## 2023-03-14 MED ORDER — ACETAMINOPHEN 500 MG PO TABS
1000.0000 mg | ORAL_TABLET | Freq: Once | ORAL | Status: AC
  Administered 2023-03-14: 1000 mg via ORAL
  Filled 2023-03-14: qty 2

## 2023-03-14 MED ORDER — METHOCARBAMOL 500 MG PO TABS: 500.0000 mg | ORAL_TABLET | Freq: Two times a day (BID) | ORAL | 0 refills | Status: AC
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
## 2023-03-14 NOTE — Discharge Instructions (Addendum)
Please take your medications as prescribed. Take tylenol/ibuprofen and Robaxin for pain. Use ice/heat packs for symptom relief. I recommend close follow-up with PCP for reevaluation.  Please do not hesitate to return to emergency department if worrisome signs symptoms we discussed become apparent.

## 2023-03-14 NOTE — ED Notes (Signed)
Called [facility] and arranged transportation for patient 

## 2023-03-14 NOTE — ED Notes (Signed)
Patient is requesting something for pain, Asked MD on shift.

## 2023-03-18 ENCOUNTER — Ambulatory Visit: Payer: Medicaid Other | Admitting: Physical Therapy

## 2023-03-25 ENCOUNTER — Ambulatory Visit: Payer: Medicaid Other | Admitting: Physical Therapy

## 2023-07-21 ENCOUNTER — Ambulatory Visit: Payer: MEDICAID | Attending: Physician Assistant

## 2023-07-21 ENCOUNTER — Other Ambulatory Visit: Payer: Self-pay

## 2023-07-21 DIAGNOSIS — R2689 Other abnormalities of gait and mobility: Secondary | ICD-10-CM | POA: Diagnosis present

## 2023-07-21 DIAGNOSIS — M6281 Muscle weakness (generalized): Secondary | ICD-10-CM | POA: Insufficient documentation

## 2023-07-21 DIAGNOSIS — M6249 Contracture of muscle, multiple sites: Secondary | ICD-10-CM | POA: Insufficient documentation

## 2023-07-21 DIAGNOSIS — G800 Spastic quadriplegic cerebral palsy: Secondary | ICD-10-CM | POA: Diagnosis present

## 2023-07-21 NOTE — Therapy (Signed)
OUTPATIENT PHYSICAL THERAPY WHEELCHAIR EVALUATION   Patient Name: Christine Gomez MRN: [id number] DOB:[birth year], 37 y.o., female Today's Date: 07/21/2023  END OF SESSION:  PT End of Session - 07/21/23 1654     Visit Number 1    Number of Visits 1    PT Start Time 1130    PT Stop Time 1215    PT Time Calculation (min) 45 min    Activity Tolerance Patient tolerated treatment well    Behavior During Therapy [facility] for tasks assessed/performed             Past Medical History:  Diagnosis Date   Acid reflux    Cerebral palsy (HCC)    Past Surgical History:  Procedure Laterality Date   dorsal rhizotomy  1996   back ligament looosening surgery   ESOPHAGOGASTRODUODENOSCOPY (EGD) WITH PROPOFOL N/A 10/08/2016   Procedure: ESOPHAGOGASTRODUODENOSCOPY (EGD) WITH PROPOFOL;  Surgeon: Willis Modena, MD;  Location: [facility];  Service: Endoscopy;  Laterality: N/A;   left arm surgery  03/2021   Patient Active Problem List   Diagnosis Date Noted   Dysphagia 05/31/2021   Epigastric pain 05/31/2021   Gastroesophageal reflux disease 05/31/2021   Hematemesis 05/31/2021   Hiatal hernia 05/31/2021   Microcytic anemia 05/31/2021   Sagittal band rupture, extensor tendon, nontraumatic, left 05/01/2021   Cerebral palsy (HCC) 04/25/2020   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   Other acquired deformity of other parts of limb 04/25/2020   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   Unspecified deformity of forearm, excluding fingers 04/25/2020   Urinary incontinence 04/25/2020   Iron deficiency anemia due to chronic blood loss 07/10/2017    PCP: Vida Rigger, NP  REFERRING PROVIDER: Nathaneil Canary, PA-C  THERAPY DIAG:  Muscle weakness (generalized)  Spastic quadriplegic cerebral palsy (HCC)  Other abnormalities of gait and mobility  Contracture of muscle, multiple sites  Rationale for Evaluation and  Treatment Rehabilitation  SUBJECTIVE:                                                                                                                                                                                           SUBJECTIVE STATEMENT: Pt presents for wheelchair evaluation. Patient is currently in group 3 power mobility device that is not meeting her personal and medical needs. Patient lives in an apartment with   PRECAUTIONS: Fall  RED FLAGS: None  WEIGHT BEARING RESTRICTIONS No    OCCUPATION: self employed  PLOF:  Needs assistance with ADLs, Needs assistance with homemaking, Needs assistance with gait, and Needs assistance with transfers  PATIENT  GOALS: obtain new power mobility device as current one is aging and not meeting her needs         MEDICAL HISTORY:  Primary diagnosis onset: 07/14/2023- date of referral     Medical Diagnosis with ICD-10 code: G80.9 (ICD-10-CM) - Cerebral palsy, unspecified Z99.3 (ICD-10-CM) - Dependence on wheelchair   [] Progressive disease  Relevant future surgeries:     Height: 4\' 10"  Weight: 173 lbs Explain recent changes or trends in weight:      History:  Past Medical History:  Diagnosis Date   Acid reflux    Cerebral palsy (HCC)        Cardio Status:  Functional Limitations:   [x] Intact  []  Impaired      Respiratory Status:  Functional Limitations:   [x] Intact  [] Impaired   [] SOB [] COPD [] O2 Dependent ______LPM  [] Ventilator Dependent  Resp equip:                                                     Objective Measure(s):   Orthotics:   [] Amputee:                                                             [] Prosthesis:        HOME ENVIRONMENT:  [] House [] Condo/town home [x] Apartment [] Asst living [] LTCF         [] Own  [] Rent   [x] Lives alone [] Lives with others -           2 roommates   ; has nurse 7 days a week, 7 hours total               Hours without assistance: 24 hours  [x] Home is accessible to patient                                  Storage of wheelchair:  [x] In home   [] Other Comments:        COMMUNITY :  TRANSPORTATION:  [] Car [] Van [x] Public Transportation [] Adapted w/c Lift []  Ambulance [] Other:                     [] Sits in wheelchair during transport   Where is w/c stored during transport?  [] [employer]  []  [employer]  r   [] Self-Driver       Drive while in  Biomedical scientist [] yes [] no   Employment and/or school: self employed Specific requirements pertaining to mobility        Other:  COMMUNICATION:  Verbal Communication  [x] WFL [] receptive [] WFL [] expressive [] Understandable  [] Difficult to understand  [] non-communicative  Primary Language:___English___________ 2nd:_____________  Communication provided by:[x] Patient [] Family [] Caregiver [] Translator   [] Uses an augmentative communication device     Manufacturer/Model :  MOBILITY/BALANCE:  Sitting Balance  Standing Balance  Transfers  Ambulation   [] WFL      [] WFL  [] Independent  []  Independent   [] Uses UE for balance in sitting Comments:  [] Uses UE/device for stability Comments:  []  Min assist  []  Ambulates independently with       device:___________________      []  Mod assist  []  Able to ambulate ______ feet        safely/functionally/independently   []  Min assist  []  Min assist  []  Max assist  []  Non-functional ambulator         History/High risk of falls   []  Mod assist  []  Mod assist  [x]  Dependent  [x]  Unable to ambulate   [x]  Max  assist  []  Max assist  Transfer method:[] 1 person [] 2 person [] sliding board [] squat pivot [] stand pivot [] mechanical patient lift  [] other:   []  Unable  [x]  Unable    Fall History: # of falls in the past 6 months? 0 # of "near" falls in the past 6 months? 0    CURRENT SEATING / MOBILITY:  Current Mobility Device: [] None [] Cane/Walker [] Manual [] Dependent [] Dependent w/ Tilt rScooter  [x] Power (type of control):   Manufacturer:[email or web address]  Edge 3 Model: Pride Serial #:   Size:  Color:  Age:   Purchased by whom: Stalls  Current condition of mobility base:    Current seating system:                                                                       Age of seating system:  37 years old  Describe posture in present seating system:    Is the current mobility meeting medical necessity?:  [] Yes [x] No Describe: difficulty with transfers due to foot plate positioning, maintenance issues.                                    Ability to complete Mobility-Related Activities of Daily Living (MRADL's) with Current Mobility Device:   Move room to room  [x] Independent  [] Min [] Mod [] Max assist  [] Unable  Comments: has hoyer lift with bed side commode which she uses for toiletting with help of nurse at home. Otherwise uses briefs for community.  Meal prep  [] Independent  [] Min [] Mod [x] Max assist  [] Unable    Feeding  [x] Independent  [] Min [] Mod [] Max assist  [] Unable    Bathing  [] Independent  [] Min [] Mod [x] Max assist  [] Unable    Grooming  [] Independent  [] Min [] Mod [x] Max assist  [] Unable    UE dressing  [] Independent  [] Min [] Mod [x] Max assist  [] Unable    LE dressing  [] Independent   [] Min [] Mod [x] Max assist  [] Unable    Toileting  [] Independent  [] Min [] Mod [x] Max assist  [] Unable      Bowel Mgt: [x]  Continent []  Incontinent []  Accidents [x]  Diapers []  Colostomy []  Bowel Program:  Bladder Mgt: [x]  Continent []  Incontinent []  Accidents [x]  Diapers []  Urinal []  Intermittent Cath []  Indwelling Cath []  Supra-pubic Cath     Current Mobility Equipment Trialed/ Ruled Out:    Does not meet mobility needs due to:    [employer]  that indicate inability to use the specific equipment listed     Meets needs for safe  independent functional  ambulation  / mobility    Risk of  Falling or History of Falls    Enviromental limitations      Cognition    Safety concerns with  physical ability    Decreased / limitations endurance  &  strength     Decreased / limitations  motor skills  & coordination    Pain    Pace /  Speed    Cardiac and/or  respiratory condition    Contra - indicated by diagnosis   Cane/Crutches  []   []   []   []   []   []   []   []   []   []   [x]    Walker / Rollator  []  NA   []   []   []   []   []   []   []   []   []   []   [x]     Manual Wheelchair [id number]:  []  NA  []   []   []   []   []   []   []   []   []   []   [x]    Manual W/C (K0005) with power assist  []  NA  []   []   []   []   []   []   []   []   []   []   [x]    Scooter  []  NA  []   []   []   []   []   []   []   []   []   []   [x]    Power Wheelchair: standard joystick  []  NA  [x]   []   []   []   []   []   []   []   []   []   []    Power Wheelchair: alternative controls  []  NA  []   []   []   []   []   []   []   []   []   []   []    Summary:  The least costly alternative for independent functional mobility was found to be:    []  Crutch/Cane  []  Walker []  Manual w/c  []  Manual w/c with power assist   []  Scooter   [x]  Power w/c std joystick   []  Power w/c alternative control        []  Requires dependent care mobility device   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 adequate for safe mobility equipment operation  [x]   Yes []   No  Patient is willing and motivated to use recommended mobility equipment  [x]   Yes []   No       []  Patient is unable to safely operate mobility equipment independently and requires dependent care equipment Comments:           SENSATION and SKIN ISSUES:  Sensation []  Intact  [x]  Impaired []  Absent []  Hyposensate []  Hypersensate  []  Defensiveness  Location(s) of impairment: L ischial tuberosity   Pressure Relief Method(s):  []  Lean side to side to offload (without risk of falling)  []   W/C push up (4+ times/hour for 15+ seconds) []  Stand up (without risk of falling)    []  Other: (Describe): Effective pressure relief method(s) above can be performed consistently throughout the day: [] Yes  [x]  No If not, Why?:  Skin Integrity Risk:        []  Low risk           []  Moderate risk            [x]  High risk  If high risk, explain: Prolonged sitting >20 hours per day, unable to perform slide  board transfers at home,  Skin Issues/Skin Integrity  Current skin Issues  [x]  Yes []  No []  Intact  [x]   Red area   [x]   Open area  []  Scar tissue  []  At risk from prolonged sitting  Where: sacrum, ischial tuberosity History of Skin Issues  [x]  Yes []  No Where : L ischial tuberosity When:Current Stage: 2 Hx of skin flap surgeries  []  Yes [x]  No Where:  When:  Pain: []  Yes [x]  No   Pain Location(s):  Intensity scale: (0-10) : How does pain interfere with mobility and/or MRADLs? -         MAT EVALUATION:  Neuro-Muscular Status: (Tone, Reflexive, Responses, etc.)     []   Intact   []  Spasticity:  [x]  Hypotonicity  []  Fluctuating  []  Muscle Spasms  []  Poor Righting Reactions/Poor Equilibrium Reactions  []  Primal Reflex(s):    Comments:            COMMENTS:    POSTURE:     Comments:  Pelvis Anterior/Posterior:  []  Neutral   [x]  Posterior  []  Anterior  []  Fixed - No movement []  Tendency away from neutral []  Flexible []  Self-correction []  External correction Obliquity (viewed from front)  [x]  WFL []  R Obliquity []  L Obliquity  []  Fixed - No movement []  Tendency away from neutral []  Flexible []  Self-correction []  External correction Rotation  [x]  WFL []  R anterior []  L anterior  []  Fixed - No movement []  Tendency away from neutral []  Flexible []  Self-correction []  External correction Tonal Influence Pelvis:  []  Normal []  Flaccid [x]  Low tone []  Spasticity []  Dystonia []  Pelvis thrust []  Other:    Trunk Anterior/Posterior:  []  WFL [x]  Thoracic kyphosis []  Lumbar lordosis  []  Fixed - No movement []  Tendency away from neutral []  Flexible []  Self-correction []  External correction  []  WFL []  Convex to left  []  Convex to right [x]  S-curve   []  C-curve []  Multiple curves []  Tendency away  from neutral []  Flexible []  Self-correction []  External correction Rotation of shoulders and upper trunk:  []  Neutral []  Left-anterior []  Right- anterior []  Fixed- no movement []  Tendency away from neutral []  Flexible []  Self correction []  External correction Tonal influence Trunk:  []  Normal []  Flaccid [x]  Low tone []  Spasticity []  Dystonia []  Other:   Head & Neck  [x]  Functional []  Flexed    []  Extended []  Rotated right  []  Rotated left []  Laterally flexed right []  Laterally flexed left []  Cervical hyperextension   [x]  Good head control []  Adequate head control []  Limited head control []  Absent head control Describe tone/movement of head and neck:      Lower Extremity Measurements: LE ROM:  Active ROM Right 07/21/2023 Left 07/21/2023  Hip flexion    Hip extension    Hip abduction    Hip adduction    Knee flexion    Knee extension    Ankle dorsiflexion    Ankle plantarflexion     (Blank rows = not tested)  LE MMT:  MMT Right 07/21/2023 Left 07/21/2023  Hip flexion    Hip extension    Hip abduction    Hip adduction    Knee flexion    Knee extension    Ankle dorsiflexion    Ankle plantarflexion     (Blank rows = not tested)  Hip positions:  []  Neutral   []  Abducted   []  Adducted  []  Subluxed   []  Dislocated   []  Fixed   []   Tendency away from neutral []  Flexible []  Self-correction []  External correction   Hip Windswept:[]  Neutral  []  Right    []  Left  []  Subluxed   []  Dislocated   []  Fixed   []  Tendency away from neutral []  Flexible []  Self-correction []  External correction  LE Tone: []  Normal []  Low tone []  Spasticity []  Flaccid []  Dystonia []  Rocks/Extends at hip []  Thrust into knee extension []  Pushes legs downward into footrest  Foot positioning: ROM Concerns: Dorsiflexed: []  Right   []  Left Plantar flexed: []  Right    []  Left Inversion: []  Right    []  Left Eversion: []  Right    []  Left  LE Edema: []  1+ (Barely  detectable impression when finger is pressed into skin) []  2+ (slight indentation. 15 seconds to rebound) []  3+ (deeper indentation. 30 seconds to rebound) []  4+ (>30 seconds to rebound)  UE Measurements:  UPPER EXTREMITY ROM:   Active ROM Right 07/21/2023 Left 07/21/2023  Shoulder flexion    Shoulder abduction    Shoulder adduction    Elbow flexion    Elbow extension    Wrist flexion    Wrist extension    (Blank rows = not tested)  UPPER EXTREMITY MMT:  MMT Right 07/21/2023 Left 07/21/2023  Shoulder flexion    Shoulder abduction    Shoulder adduction    Elbow flexion    Elbow extension    Wrist flexion    Wrist extension    Pinch strength    Grip strength    (Blank rows = not tested)  Shoulder Posture:  Right Tendency towards Left  []   Functional []    [x]   Elevation [x]    []   Depression []    []   Protraction []    []   Retraction []    []   Internal rotation []    []   External rotation []    []   Subluxed []     UE Tone: []  Normal []  Flaccid []  Low tone [x]  Spasticity  []  Dystonia []  Other:   UE Edema: [x]  1+ (Barely detectable impression when finger is pressed into skin) []  2+ (slight indentation. 15 seconds to rebound) []  3+ (deeper indentation. 30 seconds to rebound) []  4+ (>30 seconds to rebound)  Wrist/Hand: Handedness: []  Right   []  Left   []  NA: Comments:  Right  Left  []   WNL []    [x]   Limitations [x]    [x]   Contractures [x]    []   Fisting [x]    []   Tremors []    [x]   Weak grasp []    [x]   Poor dexterity [x]    []   Hand movement non functional [x]    []   Paralysis []         MOBILITY BASE RECOMMENDATIONS and JUSTIFICATION:  MOBILITY BASE  JUSTIFICATION   Manufacturer:   Permobile Model:        M5                      Color:  Seat Width:  18" Seat Depth 20"   []  Manual mobility base (continue below)   []  Scooter/POV  [x]  Power mobility base   Number of hours per day spent in above selected mobility base: 20+  Typical daily mobility base  use Schedule: during the day   [x]  is not a safe, functional ambulator  [x]  limitation prevents from completing a MRADL(s) within a reasonable time frame    [x]  limitation places at high risk of morbidity or mortality secondary to  the attempts to perform a    MRADL(s)  [x]  limitation prevents accomplishing a MRADL(s) entirely  [x]  provide independent mobility  [x]  equipment is a lifetime medical need  [x]  walker or cane inadequate  [x]  any type manual wheelchair      inadequate  [x]  scooter/POV inadequate      []  requires dependent mobility          MANUAL MOBILITY      []  Standard manual wheelchair  K0001      Arm:    []  both []  right  []  left      Foot:   []  both []  right   []  left  []  self-propels wheelchair  []  will use on regular basis  []  chair fits throughout home  []  willing and motivated to use  []  propels with assistance     []  dependent use   []  Standard hemi-manual wheelchair  K0002      Arm:    []  both []  right  []  left      Foot:   []  both []  right   []  left  []  lower seat height required to foot propel  []  short stature  []  self-propels wheelchair  []  will use on regular basis  []  chair fits throughout home  []  willing and motivated to use   []  propels with assistance  []  dependent use   []  Lightweight manual wheelchair  K0003      Arm:    []  both []  right  []  left      Foot:   []  both  []  right  []  left                   []  hemi height required  []  medical condition and weight of  wheelchair affect ability to self      propel standard manual wheelchair in the residence  []  can and does self-propel (marginal propulsion skills)  []  daily use _________hours  []  chair fits throughout home  []  willing and motivated to use  []  lower seat height required to foot propel  []  short stature   []  High strength lightweight manual  wheelchair (Breezy Ultra 4)  K0004     Arm:    []  both []  right  []  left     Foot:   []  both []  right   []  left                                                                   []  hemi height required []  medical condition and weight of wheelchair affect ability to self propel while engaging in frequent MRADL(s) that cannot be performed in a standard or lightweight manual wheelchair  []  daily use _________hours  []  chair fits throughout home  []  willing and motivated to use  []  prevent repetitive use injuries   []  lower seat height required to foot propel  []  short stature    []  Ultra-lightweight manual wheelchair  K0005     Arm:    []  both []  right  []  left     Foot:   []  both []  right  []  left       []  hemi height required  []  heavy duty  Front seat to floor _____ inches      Rear seat to floor _____ inches      Back height _____ inches     Back angle ______ degrees      Front angle _____ degrees  []   full-time manual wheelchair user  []  Requires individualized fitting and optimal adjustments for multiple features that include adjustable axle configuration, fully adjustable center of gravity, wheel camber, seat and back angle, angle of seat slope, which cannot be accommodated by a K0001 through K0004 manual wheelchair  []  prevent repetitive use injuries  []  daily use_________hours   []  user has high activity patterns that frequently require  them  to go out into the community for the purpose of independently accomplishing high level MRADL activities. Examples of these might include a combination of; shopping, work, school, Photographer, childcare, independently loading and unloading from a vehicle etc.  []  lower seat height required to foot propel  []  short stature  []  heavy duty -  weight over 250lbs   []  Current chair is a K0005   manufacture:___________________  model:_________________  serial#____________________  age:_________    []  First time [id number] user (complete trial)  K0004 time and # of strokes to propel 30 feet: ________seconds _________strokes  V4259 time and # of strokes to propel 30 feet:  ________seconds _________strokes  What was the result of the trial between the K0004 and K0005 manual wheelchair? ___    What features of the K0005 w/c are needed as compared to the K0004 base? Why?___    []  adjustable seat and back angle changes the angle of seat slope of the frame to attain a gravity assisted position for efficient propulsion and proper weight distribution along the frame     []  the front of the wheelchair will be configured higher than the back of the chair to allow gravity to assist the user with postural stability  []  the center of the wheel will be positioned for stability, safety and efficient propulsion  []  adjustable axle allows for vertical, horizontal, camber and overall width changes  throughout the wheels for adjustment of the client's exact needs and abilities.   []  adjustable axle increases the stability and function of the chair allowing for adjustment of the center of gravity.   []  accommodates the client's anatomical position in the chair maximizing independence in mobility and maneuverability in all environments.   []  create a minimal fixed tilt-in space to assist in positioning.   []  Describe users full-time manual wheelchair activity patterns:___    []  Power assist Comments:  []  prevent repetitive use injuries  []  repetitive strain injury present in    shoulder girdle    []  shoulder pain is (> or =) to 7/10     during manual propulsion       Current Pain _____/10  []  requires conservation of energy to participate in MRADL(s) runable to propel up ramps or curbs using manual wheelchair  []  been K0005 user greater than one year  []  user unwilling to use power      wheelchair (reason): []  less expensive option to power   wheelchair   []  rim activated power assist -      decreased strength   []  Heavy duty manual wheelchair       K0006     Arm:    []  both []  right  []  left     Foot:   []  both []  right  []  left     []   hemi height required    []   Dependent base  []  user exceeds 250lbs  []  non-functional ambulator    []  extreme spasticity  []  over active movement   []  broken frame/hx of repeated     repairs  []  able to self-propel in residence       []  lower seat to floor height required  []  unable to self-propel in residence   []  Extra heavy duty manual wheelchair  K0007     Arm:    []  both []  right  []  left     Foot:   []  both []  right  []  left     []  hemi height required  []  Dependent base  []  user exceeds 300lbs  []  non-functional ambulator    []  able to self-propel in residence   []  lower seat to floor height required  []  unable to self-propel in residence     []  Manual wheelchair with tilt [phone]      (Manual "Tilt-n-Space")  []  patient is dependent for transfers  []  patient requires frequent       positioning for pressure relief   []  patient requires frequent      positioning for poor/absent trunk control        []  Stroller Base  []  infant/child   []  unable to propel manual      wheelchair  []  allows for growth  []  non-functional ambulator  []  non-functional UE  []  independent mobility is not a goal at this time    MANUAL FRAME OPTIONS      Push handles  []  extended   []  angle adjustable   []  standard  []  caregiver access  []  caregiver assist    []  allows "hooking" to enable      increased ability to perform ADLs or maintain balance   []  Angle Adjustable Back  []  postural control  []  control of tone/spasticity  []  accommodation of range of motion  []  UE functional control  []  accommodation for seating system    Rear wheel placement  []  std/fixed  [] fully adjustableramputee   []  camber ________degree  []  removable rear wheel  []  non-removable rear wheel  Wheel size _______  Wheel style_______________________  []  improved UE access to wheels  []  increase propulsion ability  []  improved stability  []  changing angle in space for      improvement of postural stability  []  remove for transport    []  allow  for seating system to fit on  base  []  amputee placement  []  1-arm drive access   r R  r L  []  enable propulsion of manual       wheelchair with one arm    []  amputee placement   Wheel rims/ Hand rims  []  Standard    []  Specialized-____ []  provide ability to propel manual   []  increase self-propulsion with hand wheelchair weakness/decreased grasp     []  Spoke protector/guard   []  prevent hands from getting caught in spokes   Tires:  []  pneumatic  []  flat free inserts  []  solid  Style:  []  decrease roll resistance              []  prevent frequent flats  []  increase shock absorbency  []  decrease maintenance   []  decrease pain from road shock    []  decrease spasms from road shock    Wheel Locks:    []  push []  pull []  scissor  []  lock wheels for transfers  []   lock wheels from rolling   Brake/wheel lock extension:  []  R  []  L  []  allow user to operate wheel locks due to decreased reach or strength   Caster housing:  Caster size:                      Style:                                          []  suspension fork  []  maneuverability   []  stability of wheelchair   []  durability  []  maintenance  []  angle adjustment for posture  []  allow for feet to come under        wheelchair base  []  allows change in seat to floor  height   []  increase shock absorbency  []  decrease pain from road shock  []  decrease spasms from road    shock   []  Side guards  []  prevent clothing getting caught in wheel or becoming soiled   [] provide hip and pelvic stability  []  eliminates contact between body and wheels  []  limit hand contact with wheels   []  Anti-tippers      []  prevent wheelchair from tipping    backward  []  assist caregiver with curbs     POWER MOBILITY      []  Scooter/POV    []  can safely operate   []  can safely transfer   []  has adequate trunk stability   []  cannot functionally propel  manual wheelchair    [x]  Power mobility base    [x]  non-ambulatory   [x]  cannot functionally  propel manual wheelchair   [x]  cannot functionally and safely      operate scooter/POV  [x]  can safely operate power       wheelchair  [x]  home is accessible  [x]  willing to use power wheelchair     Tilt  [x]  Powered tilt on powered chair  []  Powered tilt on manual chair  []  Manual tilt on manual chair Comments:  [x]  change position for pressure      [x]  relief/cannot weight shift   [x]  change position against      gravitational force on head and      shoulders   []  decrease pain  []  blood pressure management   []  control autonomic dysreflexia  []  decrease respiratory distress  [x]  management of spasticity  [x]  management of low tone  [x]  facilitate postural control   [x]  rest periods   [x]  control edema  [x]  increase sitting tolerance   [x]  aid with transfers     Recline   [x]  Power recline on power chair  []  Manual recline on manual chair  Comments:    []  intermittent catheterization  [x]  manage spasticity  [x]  accommodate femur to back angle  [x]  change position for pressure relief/cannot weight shift rhigh risk of pressure sore development  [x]  tilt alone does not accomplish     effective pressure relief, maximum pressure relief achieved at -      ___55____ degrees tilt   ____140___ degrees recline   [x]  difficult to transfer to and from bed []  rest periods and sleeping in chair  [x]  repositioning for transfers  [x]  bring to full recline for ADL care  [x]  clothing/diaper changes in chair  []  gravity PEG tube feeding  [x]  head positioning  []  decrease pain  []   blood pressure management   []  control autonomic dysreflexia  []  decrease respiratory distress  []  user on ventilator     Elevator on mobility base  [x]  Power wheelchair  []  Scooter  [x]  increase Indep in transfers   [x]  increase Indep in ADLs    [x]  bathroom function and safety  [x]  kitchen/cooking function and safety  []  shopping  [x]  raise height for communication at standing level  [x]  raise height  for eye contact which reduces cervical neck strain and pain  [x]  drive at raised height for safety and navigating crowds  []  Other:   [x]  Vertical position system  (anterior tilt)       [x]  Stand       (Drive enabled)  [x]  independent weight bearing  [x]  decrease joint contractures  [x]  decrease/manage spasticity  [x]  decrease/manage spasms  [x]  pressure distribution away from   scapula, sacrum, coccyx, and ischial tuberosity  [x]  increase digestion and elimination   [x]  access to counters and cabinets  [x]  increase reach  [x]  increase interaction with others at eye level, reduces neck strain  [x]  increase performance of       MRADL(s)      Power elevating legrest    [x]  Center mount (Single) 85-170 degrees       []  Standard (Pair) 100-170 degrees  [x]  position legs at 90 degrees, not available with std power ELR  [x]  center mount tucks into chair to decrease turning radius in home, not available with std power ELR  [x]  provide change in position for LE  [x]  elevate legs during recline    [x]  maintain placement of feet on      footplate  [x]  decrease edema  [x]  improve circulation  [x]  actuator needed to elevate legrest  [x]  actuator needed to articulate legrest preventing knees from flexing  [x]  Increase ground clearance over      curbs  []   STD (pair) independently                     elevate legrest   POWER WHEELCHAIR CONTROLS      Controls/input device  [x]  Expandable  []  Non-expandable  [x]  Proportional  [x]  Right Hand []  Left Hand  []  Non-proportional/switches/head-array  []  Electrical/proximity         []   Mechanical      Manufacturer:___[employer]________________   Type:________________________ [x]  provides access for controlling wheelchair  [x]  programming for accurate control  []  progressive disease/changing condition  []  required for alternative drive      controls       []  lacks motor control to operate  proportional drive control  []  unable to understand  proportional controls  []  limited movement/strength  []  extraneous movement / tremors / ataxic / spastic       [x]  Upgraded electronics controller/harness    []  Single power (tilt or recline)   []  Expandable    []  Non-expandable plus   [x]  Multi-power (tilt, recline, power legrest, power seat lift, vertical positioning system, stand)  [x]  allows input device to communicate with drive motors  [x]  harness provides necessary connections between the controller, input device, and seat functions     [x]  needed in order to operate power seat functions through joystick/ input device  []  required for alternative drive controls     []  Enhanced display  []  required to connect all alternative drive controls   []  required for upgraded joystick      (lite-throw, heavy duty,  micro)  []  Allows user to see in which mode and drive the wheelchair is set; necessary for alternate controls       []  Upgraded tracking electronics  []  correct tracking when on uneven surfaces makes switch driving more efficient and less fatiguing  []  increase safety when driving  []  increase ability to traverse thresholds    []  Safety / reset / mode switches     Type:    []  Used to change modes and stop the wheelchair when driving     [x]  Mount for joystick / input device/switches  [x]  swing away for access or transfers   [x]  attaches joystick / input device / switches to wheelchair   [x]  provides for consistent access  [x]  midline for optimal placement    []  Attendant controlled joystick plus     mount  []  safety  []  long distance driving  []  operation of seat functions  []  compliance with transportation regulations    [x]  Battery Group 34 x 2 [x]  required to power (power assist / scooter/ power wc / other):   []  Power inverter (24V to 12V)  []  required for ventilator / respiratory equipment / other:     CHAIR OPTIONS MANUAL & POWER      Armrests   [x]  adjustable height []  removable  []  swing away []  fixed  [x]  flip  back  []  reclining  [x]  full length pads []  desk []  tube arms [x]  gel pads  [x]  provide support with elbow at 90    [x]  remove/flip back/swing away for  transfers  []  provide support and positioning of upper body    [x]  allow to come closer to table top  [x]  remove for access to tables  []  provide support for w/c tray  [x]  change of height/angles for variable activities   []  Elbow support / Elbow stop  []  keep elbow positioned on arm pad  []  keep arms from falling off arm pad  during tilt and/or recline   Upper Extremity Support  []  Arm trough  []   R  []   L  Style:  []  swivel mount []  fixed mount   []  posterior hand support  []   tray  [x]  full tray  []  joystick cut out  []   R  []   L  Style:  []  decrease gravitational pull on      shoulders  [x]  provide support to increase UE  function  [x]  provide hand support in natural    position  []  position flaccid UE  []  decrease subluxation    []  decrease edema       []  manage spasticity   [x]  provide midline positioning  [x]  provide work surface  []  placement for AAC/ Computer/ EADL       Hangers/ Legrests   []  ______ degree  []  Elevating []  articulating  []  swing away []  fixed []  lift off  []  heavy duty  []  adjustable knee angle  []  adjustable calf panel   []  longer extension tube              []  provide LE support  []  maintain placement of feet on      footplate   []  accommodate lower leg length  []  accommodate to hamstring       tightness  []  enable transfers  []  provide change in position for LE's  []  elevate legs during recline    []  decrease edema  []  durability  Foot support   [x]  footplate []  R []  L [x]  flip up           [x]  Depth adjustable   [x]  angle adjustable  []  foot board/one piece    [x]  provide foot support  [x]  accommodate to ankle ROM  [x]  allow foot to go under wheelchair base  [x]  enable transfers     []  Shoe holders  []  position foot    []  decrease / manage spasticity  []  control position of LE   []  stability    []  safety     []  Ankle strap/heel      loops  []  support foot on foot support  []  decrease extraneous movement  []  provide input to heel   []  protect foot     []  Amputee adapter []  R  []  L     Style:                  Size:  []  Provide support for stump/residual extremity    [x]  Transportation tie-down  [x]  to provide crash tested tie-down brackets    []  Crutch/cane holder    []  O2 holder    []  IV hanger   []  Ventilator tray/mount    []  stabilize accessory on wheelchair       Component  Justification     [x]  Seat cushion  ROHO high profile     [x]  accommodate impaired sensation  [x]  decubitus ulcers present or history  [x]  unable to shift weight  [x]  increase pressure distribution  []  prevent pelvic extension  [x]  custom required "off-the-shelf"    seat cushion will not accommodate deformity  [x]  stabilize/promote pelvis alignment  [x]  stabilize/promote femur alignment  [x]  accommodate obliquity  []  accommodate multiple deformity  []  incontinent/accidents  [x]  low maintenance     []  seat mounts                 []  fixed []  removable  []  attach seat platform/cushion to wheelchair frame    []  Seat wedge    []  provide increased aggressiveness of seat shape to decrease sliding  down in the seat  []  accommodate ROM        []  Cover replacement   []  protect back or seat cushion  []  incontinent/accidents    []  Solid seat / insert    []  support cushion to prevent      hammocking  []  allows attachment of cushion to mobility base    []  Lateral pelvic/thigh/hip     support (Guides)     []  decrease abduction  []  accommodate pelvis  []  position upper legs  []  accommodate spasticity  []  removable for transfers     [x]  Lateral pelvic/thigh      supports mounts  []  fixed   []  swing-away   [x]  removable  [x]  mounts lateral pelvic/thigh supports     [x]  mounts lateral pelvic/thigh supports swing-away or removable for transfers    []  Medial thigh support ([place]mel)   [] decrease adduction  [] accommodate ROM  []  remove for transfers   []  alignment      [x]  Knee blocks [x]  Promote safety [x]  To promote ADLs/MRADLs    []  Medial thigh   []  fixed      support mounts      []  swing-away   []  removable  []  mounts medial thigh supports   []  Mounts medial supports swing- away or removable for transfers       Component  Justification   [x]  Back  Ergo back     [x]  provide posterior trunk support [x]  facilitate tone  [x]  provide lumbar/sacral support [x]  accommodate deformity  [x]  support trunk in midline   [x]  custom required "off-the-shelf" back support will not accommodate deformity   [x]  provide lateral trunk support [x]  accommodate or decrease tone            []  Back mounts  []  fixed  []  removable  []  attach back rest/cushion to wheelchair frame   []  Lateral trunk      supports  []  R []  L  []  decrease lateral trunk leaning  []  accommodate asymmetry    []  contour for increased contact  []  safety    []  control of tone    [x]  Lateral trunk      supports mounts  []  fixed  [x]  swing-away   []  removable  [x]  mounts lateral trunk supports     [x]  Mounts lateral trunk supports swing-away or removable for transfers   [x]  Anterior chest      strap, vest     [x]  decrease forward movement of shoulder  [x]  decrease forward movement of trunk  [x]  safety/stability  []  added abdominal support  [x]  trunk alignment  []  assistance with shoulder control   []  decrease shoulder elevation    [x]  Headrest      [x]  provide posterior head support  [x]  provide posterior neck support  []  provide lateral head support  []  provide anterior head support  [x]  support during tilt and recline  []  improve feeding     []  improve respiration  []  placement of switches  []  safety    []  accommodate ROM   []  accommodate tone  []  improve visual orientation   [x]  Headrest           []  fixed [x]  removable []  flip down      Mounting hardware   []  swing-away laterals/switches  [x]   mount headrest   []  mounts headrest flip down or  removable for transfers  []  mount headrest swing-away laterals   []  mount switches     []  Neck Support    []  decrease neck rotation  []  decrease forward neck flexion   Pelvic Positioner    []  std hip belt          [x]  padded hip belt  []  dual pull hip belt  []  four point hip belt  [x]  stabilize tone  [x]  decrease falling out of chair  []  prevent excessive extension  []  special pull angle to control      rotation  [x]  pad for protection over boney   prominence  [x]  promote comfort    []  Essential needs        bag/pouch   []  medicines []  special food rorthotics []  clothing changes  []  diapers  []  catheter/hygiene []  ostomy supplies   The above equipment has a life- long use expectancy.  Growth and changes in medical and/or functional conditions would be the exceptions.   SUMMARY:    ASSESSMENT:  CLINICAL IMPRESSION: Patient is a 37 y.o. female who was seen today for physical therapy evaluation and treatment for power mobility device evaluation. Patient has spastic cerebral palsy. Patient has history of bil hip dislocation. Patient is non functional ambulator. 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 Patient has prior history of pressure ulcers and currently has an active ulcer under her L ischial tuberosity.  Patient will benefit  from tilt, recline and elevating leg rest functions in power chair for WB distributions, assist with don/doff clothes, diaper changes, transfers from chair to bed, and for pressure distribution to prevent pressure ulcers. Patient will also benefit from sit to stand function on power chair to improve WB on LE to improve bone health, reduce knee contractures, and improve digestive functions. Having sit to stand function built into power chair will be more effective for patient compared to having separate sit to stand frame as patient requires total A. Having sit to stand function built into  chair will allow to improve WB through her LE throughout the day and distribute weight away from her sensitive areas where she is more prone to pressure ulcers due to prolonged sitting.    OBJECTIVE IMPAIRMENTS Abnormal gait, decreased activity tolerance, decreased balance, decreased endurance, decreased mobility, difficulty walking, decreased ROM, decreased strength, hypomobility, increased edema, increased muscle spasms, impaired flexibility, impaired sensation, impaired tone, impaired UE functional use, and postural dysfunction.   ACTIVITY LIMITATIONS carrying, lifting, bending, sitting, standing, squatting, stairs, transfers, bed mobility, continence, bathing, toileting, dressing, reach over head, and hygiene/grooming  PARTICIPATION LIMITATIONS: meal prep, cleaning, laundry, medication management, shopping, community activity, and occupation  PERSONAL FACTORS Past/current experiences and Time since onset of injury/illness/exacerbation are also affecting patient's functional outcome.   REHAB POTENTIAL: Good  CLINICAL DECISION MAKING: Stable/uncomplicated  EVALUATION COMPLEXITY: Low                                   GOALS: One time visit. No goals established.    PLAN: PT FREQUENCY: one time visit    Ileana Ladd, PT 07/21/2023, 4:55 PM    I concur with the above findings and recommendations of the therapist:  Physician name printed:         Physician's signature:      Date:

## 2023-11-13 ENCOUNTER — Ambulatory Visit: Payer: MEDICAID | Admitting: Podiatry

## 2023-12-18 ENCOUNTER — Ambulatory Visit (INDEPENDENT_AMBULATORY_CARE_PROVIDER_SITE_OTHER): Payer: MEDICAID | Admitting: Podiatry

## 2023-12-18 DIAGNOSIS — M79609 Pain in unspecified limb: Secondary | ICD-10-CM

## 2023-12-18 DIAGNOSIS — B351 Tinea unguium: Secondary | ICD-10-CM

## 2023-12-18 NOTE — Progress Notes (Signed)
  Subjective:  Patient ID: Christine Gomez, female    DOB: 1986-07-29,  MRN: [id number]  Christine Gomez presents to clinic today for thick, elongated toenails b/l lower extremities which are tender when wearing enclosed shoe gear.  Patient has h/o CP.  New problem(s): Patient states left 5th digit is tender today.   PCP is Elie Confer, NP , and last visit was January, 2023.Marland Kitchen  Allergies  Allergen Reactions   Lactose Diarrhea   Milk (Cow)    Nickel Itching and Swelling    Review of Systems: Negative except as noted in the HPI. Objective:   Vascular Examination: Vascular status intact b/l with palpable pedal pulses. Pedal hair present b/l. CFT immediate b/l. No pain with calf compression b/l. Skin temperature gradient WNL b/l. Dependent edema noted b/l LE.  Neurological Examination: Sensation grossly intact b/l with 10 gram monofilament. Vibratory sensation intact b/l.   Dermatological Examination: Pedal skin with normal turgor, texture and tone b/l. Toenails 1-5 b/l thick, discolored, elongated with subungual debris and pain on dorsal palpation. No hyperkeratotic lesions noted b/l.  Left  4th and 5th digit nailplates thickened with POPl. No erythema, no edema, no drainage, no fluctuance.  Musculoskeletal Examination: Noted disuse atrophy bilaterally. 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 Utilizes motorized chair for mobility assistance.  Radiographs: None   Assessment:   No diagnosis found.  Plan:  Patient was evaluated and treated and all questions answered. Consent given for treatment as described below: -Patient was evaluated and treated. All patient's and/or POA's questions/concerns answered on today's visit. -We did discuss option of permanent removal of nailplates  of left 4th and  5th toes should it continue to remain symptomatic. She will see Dr. Lilian Kapur for evaluation and recommendations. 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 -Continue soft, supportive shoe gear daily. -Patient/POA to call should there be question/concern in the interim.  No follow-ups on file.  Candelaria Stagers, DPM

## 2024-03-24 ENCOUNTER — Ambulatory Visit: Payer: MEDICAID | Admitting: Podiatry

## 2024-03-30 ENCOUNTER — Ambulatory Visit (HOSPITAL_COMMUNITY): Payer: MEDICAID | Admitting: Physical Therapy

## 2024-03-30 NOTE — Therapy (Unsigned)
 OUTPATIENT PHYSICAL THERAPY LYMPHEDEMA EVALUATION  Patient Name: Christine Gomez MRN: [id number] DOB:1986/09/16, 38 y.o., female Today's Date: 03/30/2024  END OF SESSION:   Past Medical History:  Diagnosis Date   Acid reflux    Cerebral palsy [facility])    Past Surgical History:  Procedure Laterality Date   dorsal rhizotomy  1996   back ligament looosening surgery   ESOPHAGOGASTRODUODENOSCOPY (EGD) WITH PROPOFOL N/A 10/08/2016   Procedure: ESOPHAGOGASTRODUODENOSCOPY (EGD) WITH PROPOFOL;  Surgeon: Willis Modena, MD;  Location: [facility];  Service: Endoscopy;  Laterality: N/A;   left arm surgery  03/2021   Patient Active Problem List   Diagnosis Date Noted   Dysphagia 05/31/2021   Epigastric pain 05/31/2021   Gastroesophageal reflux disease 05/31/2021   Hematemesis 05/31/2021   Hiatal hernia 05/31/2021   Microcytic anemia 05/31/2021   Sagittal band rupture, extensor tendon, nontraumatic, left 05/01/2021   Cerebral palsy (HCC) 04/25/2020   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   Other acquired deformity of other parts of limb 04/25/2020   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   Deformity of forearm, excluding fingers 04/25/2020   Urinary incontinence 04/25/2020   Iron deficiency anemia due to chronic blood loss 07/10/2017    PCP: Vida Rigger   REFERRING PROVIDER: Lenell Antu, DO  REFERRING DIAG: I89.0 (ICD-10-CM) - Lymphedema, not elsewhere classified  THERAPY DIAG:  I89.0 (ICD-10-CM) - Lymphedema, not elsewhere classified  Rationale for Evaluation and Treatment: Rehabilitation  ONSET DATE: ***  SUBJECTIVE:                                                                                                                                                                                           SUBJECTIVE STATEMENT:   PERTINENT HISTORY: 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   Pt received 19 treatments of PT in [place] last year attempting to improve her mobility  PAIN:  Are you having pain? No  PRECAUTIONS: Other: cellulitis  RED FLAGS: None   WEIGHT BEARING RESTRICTIONS: No; pt needs assistance with transfers uses a wheelchair for locomotion.     FALLS:  Has patient fallen in last 6 months? No  LIVING ENVIRONMENT: Lives with: roomates who do not assist in her care.  She has an aide for 3 hours in the mornings and voc rehab for 4 hours in the evenings.  House/apartment Stairs: No Has following equipment at home: Wheelchair (power)   PRIOR LEVEL OF FUNCTION: Requires assistive device for independence  PATIENT GOALS: ***   OBJECTIVE: Note: Objective measures were completed at Evaluation unless otherwise noted.  COGNITION: Overall cognitive status: Within functional limits for tasks assessed   PALPATION: ***   LYMPHEDEMA ASSESSMENTS:    LYMPHEDEMA ASSESSMENTS:     LE LANDMARK RIGHT eval  At groin   30 cm proximal to suprapatella   20 cm proximal to suprapatella   10 cm proximal to suprapatella   At midpatella / popliteal crease   30 cm proximal to floor at lateral plantar foot   20 cm proximal to floor at lateral plantar foot   10 cm proximal to floor at lateral plantar foot   Circumference of ankle/heel   5 cm proximal to 1st MTP joint   Across MTP joint   Around proximal great toe   (Blank rows = not tested)  LE LANDMARK LEFT eval  At groin   30 cm proximal to suprapatella   20 cm proximal to suprapatella   10 cm proximal to suprapatella   At midpatella / popliteal crease   30 cm proximal to floor at lateral plantar foot   20 cm proximal to floor at lateral plantar foot   10 cm proximal to floor at lateral plantar foot   Circumference of ankle/heel   5 cm proximal to 1st MTP joint   Across MTP joint   Around proximal great toe   (Blank rows = not tested)    TODAY'S TREATMENT:                                                                                                                               DATE: ***   PATIENT EDUCATION:  Education details: *** Person educated: {Person educated:25204} Education method: {Education Method:25205} Education comprehension: {Education Comprehension:25206}  HOME EXERCISE PROGRAM: ***  ASSESSMENT:  CLINICAL IMPRESSION: Patient is a 38 y.o. female who was seen today for physical therapy evaluation and treatment for B .   OBJECTIVE IMPAIRMENTS: {opptimpairments:25111}.   ACTIVITY LIMITATIONS: {activitylimitations:27494}  PARTICIPATION LIMITATIONS: {participationrestrictions:25113}  PERSONAL FACTORS: {Personal factors:25162} are also affecting patient's functional outcome.   REHAB POTENTIAL: {rehabpotential:25112}  CLINICAL DECISION MAKING: {clinical decision making:25114}  EVALUATION COMPLEXITY: {Evaluation complexity:25115}  GOALS: Goals reviewed with patient? {yes/no:20286}  SHORT TERM GOALS: Target date: ***  *** Baseline: Goal status: INITIAL  2.  *** Baseline:  Goal status: INITIAL  3.  *** Baseline:  Goal status: INITIAL  4.  *** Baseline:  Goal status: INITIAL  5.  *** Baseline:  Goal status: INITIAL  6.  *** Baseline:  Goal status: INITIAL  LONG TERM GOALS: Target date: ***  *** Baseline:  Goal status: INITIAL  2.  *** Baseline:  Goal status: INITIAL  3.  *** Baseline:  Goal status: INITIAL  4.  *** Baseline:  Goal status: INITIAL  5.  *** Baseline:  Goal status: INITIAL  6.  *** Baseline:  Goal status: INITIAL  PLAN:  PT FREQUENCY: {rehab frequency:25116}  PT DURATION: {rehab duration:25117}  PLANNED INTERVENTIONS: {rehab planned interventions:25118::"97110-Therapeutic exercises","97530- Therapeutic [phone]- Neuromuscular re-education","97535- Self  XFGH","82993- Manual therapy"}  PLAN FOR NEXT SESSION: ***   Croix Presley,CINDY, PT 03/30/2024, 9:28 AM

## 2024-04-01 ENCOUNTER — Encounter (HOSPITAL_COMMUNITY): Payer: MEDICAID | Admitting: Physical Therapy

## 2024-04-04 ENCOUNTER — Encounter (HOSPITAL_COMMUNITY): Payer: MEDICAID

## 2024-04-06 ENCOUNTER — Ambulatory Visit (HOSPITAL_COMMUNITY): Payer: MEDICAID | Attending: Family Medicine | Admitting: Physical Therapy

## 2024-04-06 ENCOUNTER — Encounter (HOSPITAL_COMMUNITY): Payer: Self-pay | Admitting: Physical Therapy

## 2024-04-06 ENCOUNTER — Other Ambulatory Visit: Payer: Self-pay

## 2024-04-06 DIAGNOSIS — I890 Lymphedema, not elsewhere classified: Secondary | ICD-10-CM | POA: Diagnosis present

## 2024-04-06 NOTE — Therapy (Signed)
 OUTPATIENT PHYSICAL THERAPY LYMPHEDEMA EVALUATION  Patient Name: Christine Gomez MRN: [id number] DOB:[birth year], 38 y.o., female Today's Date: 04/06/2024  END OF SESSION:  PT End of Session - 04/06/24 1516     Visit Number 1    Number of Visits 18    Date for PT Re-Evaluation 05/18/24    Authorization Type [employer]ville Lewis put in    Progress Note Due on Visit 10    PT Start Time 0915    PT Stop Time 1015    PT Time Calculation (min) 60 min    Activity Tolerance Patient tolerated treatment well    Behavior During Therapy [facility] for tasks assessed/performed             Past Medical History:  Diagnosis Date   Acid reflux    Cerebral palsy (HCC)    Past Surgical History:  Procedure Laterality Date   dorsal rhizotomy  1996   back ligament looosening surgery   ESOPHAGOGASTRODUODENOSCOPY (EGD) WITH PROPOFOL N/A 10/08/2016   Procedure: ESOPHAGOGASTRODUODENOSCOPY (EGD) WITH PROPOFOL;  Surgeon: Willis Modena, MD;  Location: [facility];  Service: Endoscopy;  Laterality: N/A;   left arm surgery  03/2021   Patient Active Problem List   Diagnosis Date Noted   Dysphagia 05/31/2021   Epigastric pain 05/31/2021   Gastroesophageal reflux disease 05/31/2021   Hematemesis 05/31/2021   Hiatal hernia 05/31/2021   Microcytic anemia 05/31/2021   Sagittal band rupture, extensor tendon, nontraumatic, left 05/01/2021   Cerebral palsy (HCC) 04/25/2020   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   Other acquired deformity of other parts of limb 04/25/2020   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   Deformity of forearm, excluding fingers 04/25/2020   Urinary incontinence 04/25/2020   Iron deficiency anemia due to chronic blood loss 07/10/2017    PCP: Vida Rigger   REFERRING PROVIDER: Lenell Antu, DO  REFERRING DIAG: I89.0 (ICD-10-CM) - Lymphedema, not elsewhere classified  THERAPY DIAG:  I89.0 (ICD-10-CM) -  Lymphedema, not elsewhere classified  Rationale for Evaluation and Treatment: Rehabilitation  ONSET DATE: 06/28/24  SUBJECTIVE:                                                                                                                                                                                           SUBJECTIVE STATEMENT: Pt states that her feet has always been slightly swollen since she was born as she has CP and is wheelchair bound.  This summer the swelling increased significantly progressed to the point where she can not put her shoes on.  She has pain with pressure but  not at rest.  She is unable of exercises due to her spastic CP.  She is able to raise the foot of her bed which she does but it does not make any difference. PT bought compression garments 2 months ago but due to the CP she is unable to put them on and now she is so swollen that her aide can not put don the garment.  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  Pt received 19 treatments of PT in [place] last year attempting to improve her mobility  PAIN:  Are you having pain? Only with pressure, 4/10   PRECAUTIONS: Other: cellulitis  RED FLAGS: None   WEIGHT BEARING RESTRICTIONS: No; pt needs assistance with transfers uses a wheelchair for locomotion.     FALLS:  Has patient fallen in last 6 months? No  LIVING ENVIRONMENT: Lives with: roomates who do not assist in her care.  She has an aide for 3 hours in the mornings and voc rehab for 4 hours in the evenings.  House/apartment Stairs: No Has following equipment at home: Wheelchair (power)   PRIOR LEVEL OF FUNCTION: Requires assistive device for independence  PATIENT GOALS: To be able to wear shoes again   OBJECTIVE: Note: Objective measures were completed at Evaluation unless otherwise noted.  COGNITION: Overall cognitive status: Within functional limits for tasks assessed   PALPATION: Noted induration of LE  with noted B dorsal hump on feet.    LYMPHEDEMA ASSESSMENTS:     LE LANDMARK RIGHT eval  At groin   30 cm proximal to suprapatella   20 cm proximal to suprapatella   10 cm proximal to suprapatella   At midpatella / popliteal crease   30 cm proximal to floor at lateral plantar foot 40.2  20 cm proximal to floor at lateral plantar foot 34.2  10 cm proximal to floor at lateral plantar foot 27  Circumference of ankle/heel 33  5 cm proximal to 1st MTP joint 27  Across MTP joint 23.9  Around proximal great toe 8.5  (Blank rows = not tested)  LE LANDMARK LEFT eval  At groin   30 cm proximal to suprapatella   20 cm proximal to suprapatella   10 cm proximal to suprapatella   At midpatella / popliteal crease   30 cm proximal to floor at lateral plantar foot 42.1  20 cm proximal to floor at lateral plantar foot 34.7  10 cm proximal to floor at lateral plantar foot 26.6  Circumference of ankle/heel 32.2  5 cm proximal to 1st MTP joint 25.2  Across MTP joint 22.3  Around proximal great toe 8.8  (Blank rows = not tested)    TODAY'S TREATMENT:                                                                                                                              DATE: 04/06/24  Eval : Education on the  four aspects to lymphedema to include: skin care, exercises, manual and compression.   PATIENT EDUCATION:  Education details: What lymphedema is four aspects to treatment. exercises Person educated: Patient Education method: Explanation, Demonstration, Verbal cues, and Handouts Education comprehension: verbalized understanding  HOME EXERCISE PROGRAM:Sitting:  Ankle pumps, LAQ, hip ab/adduction, marching and diaphragm breathing. (Note pt is only able to complete less than a 1/4 of ROM due to spastic CP, however, therapist encouraged pt to complete 2x /day as this will still increase lymphatic circulation, maybe not as much as if she could complete full ROM but something is better  than nothing.)  ASSESSMENT:  CLINICAL IMPRESSION: Patient is a 38 y.o. female who was seen today for physical therapy evaluation and treatment for B LE lymphedema.  The pt has been elevating, exercising and using compression as able for 6 months with the edema progressively progressing.  She now has induration in B LE, increased pain and B dorsal humps and is unable to don any shoes.  She has been referred to skilled PT for therapy.  Christine Gomez will benefit from skilled PT for total decongestive techniques to reduce the induration and dorsal humps to allow the pt to don her shoes again.  She will then benefit from obtaining a compression pump as well as juxtafit compression for both feet and LE as pt has spastic CP and she and her aide is having significant difficulty donning the compression garment that she obtained six months ago.    OBJECTIVE IMPAIRMENTS: decreased ROM, increased edema, and pain.   ACTIVITY LIMITATIONS: dressing and hygiene/grooming  PERSONAL FACTORS: Fitness and 1 comorbidity: spastic CP  are also affecting patient's functional outcome.   REHAB POTENTIAL: Good  CLINICAL DECISION MAKING: Evolving/moderate complexity  EVALUATION COMPLEXITY: Moderate  GOALS: Goals reviewed with patient? No  SHORT TERM GOALS: Target date: 04/27/24  PT to be I with HEP Baseline: Goal status: INITIAL  2.  PT to have lost 2 cm in foot and LE to reduce risk of cellulitis  Baseline:  Goal status: INITIAL   LONG TERM GOALS: Target date: 05/18/24  PT pain to decrease to no greater than a 2 Baseline:  Goal status: INITIAL  2.  PT to have lost 4 in foot measurement to be able to fit into a shoe. Baseline:  Goal status: INITIAL  3.  PT leg measurements to be decreased 3-4 cm to allow improved skin integrity. (Currently very dry and slighty red).  Baseline:  Goal status: INITIAL   PLAN:  PT FREQUENCY: 2x/week  PT DURATION: 6 weeks  PLANNED INTERVENTIONS: 97110-Therapeutic  exercises, 97535- Self Care, and 43329- Manual therapy  PLAN FOR NEXT SESSION: cut foam for LE, begin total decongestive techinques.   Virgina Organ, PT CLT [phone]  04/06/2024, 3:21 PM

## 2024-04-08 ENCOUNTER — Encounter (HOSPITAL_COMMUNITY): Payer: MEDICAID | Admitting: Physical Therapy

## 2024-04-11 ENCOUNTER — Ambulatory Visit (HOSPITAL_COMMUNITY): Payer: MEDICAID

## 2024-04-11 ENCOUNTER — Encounter (HOSPITAL_COMMUNITY): Payer: Self-pay

## 2024-04-11 DIAGNOSIS — I890 Lymphedema, not elsewhere classified: Secondary | ICD-10-CM

## 2024-04-11 NOTE — Therapy (Signed)
 OUTPATIENT PHYSICAL THERAPY LYMPHEDEMA EVALUATION  Patient Name: Christine Gomez MRN: [id number] DOB:1986/06/02, 38 y.o., female Today's Date: 04/11/2024  END OF SESSION:  PT End of Session - 04/11/24 1102     Visit Number 2    Number of Visits 18    Date for PT Re-Evaluation 05/18/24    Authorization Type Marelyn Shan put in    Progress Note Due on Visit 10    PT Start Time 0915    PT Stop Time 1008    PT Time Calculation (min) 53 min    Activity Tolerance Patient tolerated treatment well    Behavior During Therapy [facility] for tasks assessed/performed             Past Medical History:  Diagnosis Date   Acid reflux    Cerebral palsy (HCC)    Past Surgical History:  Procedure Laterality Date   dorsal rhizotomy  1996   back ligament looosening surgery   ESOPHAGOGASTRODUODENOSCOPY (EGD) WITH PROPOFOL N/A 10/08/2016   Procedure: ESOPHAGOGASTRODUODENOSCOPY (EGD) WITH PROPOFOL;  Surgeon: Evangeline Hilts, MD;  Location: [facility];  Service: Endoscopy;  Laterality: N/A;   left arm surgery  03/2021   Patient Active Problem List   Diagnosis Date Noted   Dysphagia 05/31/2021   Epigastric pain 05/31/2021   Gastroesophageal reflux disease 05/31/2021   Hematemesis 05/31/2021   Hiatal hernia 05/31/2021   Microcytic anemia 05/31/2021   Sagittal band rupture, extensor tendon, nontraumatic, left 05/01/2021   Cerebral palsy (HCC) 04/25/2020   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   Other acquired deformity of other parts of limb 04/25/2020   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   Deformity of forearm, excluding fingers 04/25/2020   Urinary incontinence 04/25/2020   Iron deficiency anemia due to chronic blood loss 07/10/2017    PCP: Rhett Cella   REFERRING PROVIDER: Armand Lamy, DO  REFERRING DIAG: I89.0 (ICD-10-CM) - Lymphedema, not elsewhere classified  THERAPY DIAG:  I89.0 (ICD-10-CM) -  Lymphedema, not elsewhere classified  Rationale for Evaluation and Treatment: Rehabilitation  ONSET DATE: 06/28/24  SUBJECTIVE:                                                                                                                                                                                           SUBJECTIVE STATEMENT:  04/11/24:  Pt stated she hasn't purchased bandaging yet due to financial concerns.  Has began exercises.  Pt states that her feet has always been slightly swollen since she was born as she has CP and is wheelchair bound.  This summer the swelling increased significantly  progressed to the point where she can not put her shoes on.  She has pain with pressure but not at rest.  She is unable of exercises due to her spastic CP.  She is able to raise the foot of her bed which she does but it does not make any difference. PT bought compression garments 2 months ago but due to the CP she is unable to put them on and now she is so swollen that her aide can not put don the garment.  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  Pt received 19 treatments of PT in [place] last year attempting to improve her mobility  PAIN:  Are you having pain? Only with pressure, 4/10   PRECAUTIONS: Other: cellulitis  RED FLAGS: None   WEIGHT BEARING RESTRICTIONS: No; pt needs assistance with transfers uses a wheelchair for locomotion.     FALLS:  Has patient fallen in last 6 months? No  LIVING ENVIRONMENT: Lives with: roomates who do not assist in her care.  She has an aide for 3 hours in the mornings and voc rehab for 4 hours in the evenings.  House/apartment Stairs: No Has following equipment at home: Wheelchair (power)   PRIOR LEVEL OF FUNCTION: Requires assistive device for independence  PATIENT GOALS: To be able to wear shoes again   OBJECTIVE: Note: Objective measures were completed at Evaluation unless otherwise  noted.  COGNITION: Overall cognitive status: Within functional limits for tasks assessed   PALPATION: Noted induration of LE with noted B dorsal hump on feet.    LYMPHEDEMA ASSESSMENTS:     LE LANDMARK RIGHT eval  At groin   30 cm proximal to suprapatella   20 cm proximal to suprapatella   10 cm proximal to suprapatella   At midpatella / popliteal crease   30 cm proximal to floor at lateral plantar foot 40.2  20 cm proximal to floor at lateral plantar foot 34.2  10 cm proximal to floor at lateral plantar foot 27  Circumference of ankle/heel 33  5 cm proximal to 1st MTP joint 27  Across MTP joint 23.9  Around proximal great toe 8.5  (Blank rows = not tested)  LE LANDMARK LEFT eval  At groin   30 cm proximal to suprapatella   20 cm proximal to suprapatella   10 cm proximal to suprapatella   At midpatella / popliteal crease   30 cm proximal to floor at lateral plantar foot 42.1  20 cm proximal to floor at lateral plantar foot 34.7  10 cm proximal to floor at lateral plantar foot 26.6  Circumference of ankle/heel 32.2  5 cm proximal to 1st MTP joint 25.2  Across MTP joint 22.3  Around proximal great toe 8.8  (Blank rows = not tested)    TODAY'S TREATMENT:  DATE:  04/11/24:  Reviewed 4 aspects of lymphedema care  Assisted in ordering proper bandage supplies  Foam cut (under cabinet)  Manual decongestive techniques anterior only, reclined in power WC.  Educated and given self care handout. 04/06/24  Eval : Education on the four aspects to lymphedema to include: skin care, exercises, manual and compression.   PATIENT EDUCATION:  Education details: What lymphedema is four aspects to treatment. exercises Person educated: Patient Education method: Explanation, Demonstration, Verbal cues, and Handouts Education comprehension: verbalized  understanding  HOME EXERCISE PROGRAM:Sitting:  Ankle pumps, LAQ, hip ab/adduction, marching and diaphragm breathing. (Note pt is only able to complete less than a 1/4 of ROM due to spastic CP, however, therapist encouraged pt to complete 2x /day as this will still increase lymphatic circulation, maybe not as much as if she could complete full ROM but something is better than nothing.)  ASSESSMENT:  CLINICAL IMPRESSION: 04/11/24:  Began session reviewing 4 compenants of lymphedema care.  Pt reports she has began exercises without questions.  Following discussion pt placed order for dressings during session via [employer].  Foam cut for BLE knee high (under cabinet).  Manual decongestive techniques for inguinal to axillary anastomoses with pt reclined in her power WC.  Pt educated of self massage and given handout to begin at home.  Eval:  Patient is a 38 y.o. female who was seen today for physical therapy evaluation and treatment for B LE lymphedema.  The pt has been elevating, exercising and using compression as able for 6 months with the edema progressively progressing.  She now has induration in B LE, increased pain and B dorsal humps and is unable to don any shoes.  She has been referred to skilled PT for therapy.  Ms Langlinais will benefit from skilled PT for total decongestive techniques to reduce the induration and dorsal humps to allow the pt to don her shoes again.  She will then benefit from obtaining a compression pump as well as juxtafit compression for both feet and LE as pt has spastic CP and she and her aide is having significant difficulty donning the compression garment that she obtained six months ago.    OBJECTIVE IMPAIRMENTS: decreased ROM, increased edema, and pain.   ACTIVITY LIMITATIONS: dressing and hygiene/grooming  PERSONAL FACTORS: Fitness and 1 comorbidity: spastic CP  are also affecting patient's functional outcome.   REHAB POTENTIAL: Good  CLINICAL DECISION MAKING:  Evolving/moderate complexity  EVALUATION COMPLEXITY: Moderate  GOALS: Goals reviewed with patient? No  SHORT TERM GOALS: Target date: 04/27/24  PT to be I with HEP Baseline: Goal status: INITIAL  2.  PT to have lost 2 cm in foot and LE to reduce risk of cellulitis  Baseline:  Goal status: INITIAL   LONG TERM GOALS: Target date: 05/18/24  PT pain to decrease to no greater than a 2 Baseline:  Goal status: INITIAL  2.  PT to have lost 4 in foot measurement to be able to fit into a shoe. Baseline:  Goal status: INITIAL  3.  PT leg measurements to be decreased 3-4 cm to allow improved skin integrity. (Currently very dry and slighty red).  Baseline:  Goal status: INITIAL   PLAN:  PT FREQUENCY: 2x/week  PT DURATION: 6 weeks  PLANNED INTERVENTIONS: 97110-Therapeutic exercises, 97535- Self Care, and 16109- Manual therapy  PLAN FOR NEXT SESSION: cut foam for LE, begin total decongestive techinques.   Becky Sax, LPTA/CLT; [place] [phone]  04/11/2024, 11:04 AM

## 2024-04-13 ENCOUNTER — Encounter (HOSPITAL_COMMUNITY): Payer: MEDICAID | Admitting: Physical Therapy

## 2024-04-15 ENCOUNTER — Ambulatory Visit (HOSPITAL_COMMUNITY): Payer: MEDICAID | Admitting: Physical Therapy

## 2024-04-15 DIAGNOSIS — I890 Lymphedema, not elsewhere classified: Secondary | ICD-10-CM | POA: Diagnosis not present

## 2024-04-15 NOTE — Therapy (Signed)
 OUTPATIENT PHYSICAL THERAPY LYMPHEDEMA EVALUATION  Patient Name: Christine Gomez MRN: [id number] DOB:1986/07/09, 38 y.o., female Today's Date: 04/15/2024  END OF SESSION:  PT End of Session - 04/15/24 1012     Visit Number 3    Number of Visits 18    Date for PT Re-Evaluation 05/18/24    Authorization Type Marelyn Shan put in    Progress Note Due on Visit 10    PT Start Time 0912    PT Stop Time 1009    PT Time Calculation (min) 57 min    Activity Tolerance Patient tolerated treatment well    Behavior During Therapy [facility] for tasks assessed/performed              Past Medical History:  Diagnosis Date   Acid reflux    Cerebral palsy (HCC)    Past Surgical History:  Procedure Laterality Date   dorsal rhizotomy  1996   back ligament looosening surgery   ESOPHAGOGASTRODUODENOSCOPY (EGD) WITH PROPOFOL  N/A 10/08/2016   Procedure: ESOPHAGOGASTRODUODENOSCOPY (EGD) WITH PROPOFOL ;  Surgeon: Evangeline Hilts, MD;  Location: [facility];  Service: Endoscopy;  Laterality: N/A;   left arm surgery  03/2021   Patient Active Problem List   Diagnosis Date Noted   Dysphagia 05/31/2021   Epigastric pain 05/31/2021   Gastroesophageal reflux disease 05/31/2021   Hematemesis 05/31/2021   Hiatal hernia 05/31/2021   Microcytic anemia 05/31/2021   Sagittal band rupture, extensor tendon, nontraumatic, left 05/01/2021   Cerebral palsy (HCC) 04/25/2020   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   Other acquired deformity of other parts of limb 04/25/2020   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   Deformity of forearm, excluding fingers 04/25/2020   Urinary incontinence 04/25/2020   Iron deficiency anemia due to chronic blood loss 07/10/2017    PCP: Rhett Cella   REFERRING PROVIDER: Armand Lamy, DO  REFERRING DIAG: I89.0 (ICD-10-CM) - Lymphedema, not elsewhere classified  THERAPY DIAG:  I89.0 (ICD-10-CM) -  Lymphedema, not elsewhere classified  Rationale for Evaluation and Treatment: Rehabilitation  ONSET DATE: 06/28/24  SUBJECTIVE:                                                                                                                                                                                           SUBJECTIVE STATEMENT:  PT states that she has been completing her exercises and elevating her LE.  Bandages are ordered but have not arrived yet.   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  Pt received 19 treatments of PT in [place] last  year attempting to improve her mobility  PAIN:  Are you having pain? Only with pressure, 4/10   PRECAUTIONS: Other: cellulitis  RED FLAGS: None   WEIGHT BEARING RESTRICTIONS: No; pt needs assistance with transfers uses a wheelchair for locomotion.     FALLS:  Has patient fallen in last 6 months? No  LIVING ENVIRONMENT: Lives with: roomates who do not assist in her care.  She has an aide for 3 hours in the mornings and voc rehab for 4 hours in the evenings.  House/apartment Stairs: No Has following equipment at home: Wheelchair (power)   PRIOR LEVEL OF FUNCTION: Requires assistive device for independence  PATIENT GOALS: To be able to wear shoes again   OBJECTIVE: Note: Objective measures were completed at Evaluation unless otherwise noted.  COGNITION: Overall cognitive status: Within functional limits for tasks assessed   PALPATION: Noted induration of LE with noted B dorsal hump on feet.    LYMPHEDEMA ASSESSMENTS:     LE LANDMARK RIGHT eval 04/15/24  At groin    30 cm proximal to suprapatella    20 cm proximal to suprapatella    10 cm proximal to suprapatella    At midpatella / popliteal crease    30 cm proximal to floor at lateral plantar foot 40.2 39  20 cm proximal to floor at lateral plantar foot 34.2 33  10 cm proximal to floor at lateral plantar foot 27 26.5  Circumference  of ankle/heel 33 31.5  5 cm proximal to 1st MTP joint 27 25.6  Across MTP joint 23.9 24.2  Around proximal great toe 8.5 8.1  (Blank rows = not tested)  LE LANDMARK LEFT eval 04/15/24  At groin    30 cm proximal to suprapatella    20 cm proximal to suprapatella    10 cm proximal to suprapatella    At midpatella / popliteal crease    30 cm proximal to floor at lateral plantar foot 42.1 39.7  20 cm proximal to floor at lateral plantar foot 34.7 35  10 cm proximal to floor at lateral plantar foot 26.6 25.7  Circumference of ankle/heel 32.2 31.6  5 cm proximal to 1st MTP joint 25.2 24.4  Across MTP joint 22.3 22  Around proximal great toe 8.8 8.1  (Blank rows = not tested)    TODAY'S TREATMENT:                                                                                                                              DATE:   04/15/24: PT measured.  Manual decongestive techniques anterior only, reclined in power WC to include supraclavicular, deep and superficial abdominal, inguinal/axillary anastomsis and LE.  Manual completed anterior only to B LE.  Compression bandaging using 1/2" foam and multilayer short stretch bandaging from toes to knee B.     PATIENT EDUCATION:  Education details: What lymphedema is four aspects to treatment. exercises Person educated: Patient Education method: Explanation,  Demonstration, Verbal cues, and Handouts Education comprehension: verbalized understanding  HOME EXERCISE PROGRAM:Sitting:  Ankle pumps, LAQ, hip ab/adduction, marching and diaphragm breathing. (Note pt is only able to complete less than a 1/4 of ROM due to spastic CP, however, therapist encouraged pt to complete 2x /day as this will still increase lymphatic circulation, maybe not as much as if she could complete full ROM but something is better than nothing.)  ASSESSMENT:  CLINICAL IMPRESSION:  PT measured with noted reduction.  Therapist used department's short stretch bandages which  will be replaced once pt bandages come in so that pt can benefit from total decongestive techniques as soon as possible.    OBJECTIVE IMPAIRMENTS: decreased ROM, increased edema, and pain.   ACTIVITY LIMITATIONS: dressing and hygiene/grooming  PERSONAL FACTORS: Fitness and 1 comorbidity: spastic CP  are also affecting patient's functional outcome.   REHAB POTENTIAL: Good  CLINICAL DECISION MAKING: Evolving/moderate complexity  EVALUATION COMPLEXITY: Moderate  GOALS: Goals reviewed with patient? No  SHORT TERM GOALS: Target date: 04/27/24  PT to be I with HEP Baseline: Goal status: met  2.  PT to have lost 2 cm in foot and LE to reduce risk of cellulitis  Baseline:  Goal status: on-going    LONG TERM GOALS: Target date: 05/18/24  PT pain to decrease to no greater than a 2 Baseline:  Goal status: on-going  2.  PT to have lost 4cm  foot measurement to be able to fit into a shoe. Baseline:  Goal status: on-going  3.  PT leg measurements to be decreased 3-4 cm to allow improved skin integrity. (Currently very dry and slighty red).  Baseline:  Goal status: on-going   PLAN:  PT FREQUENCY: 2x/week  PT DURATION: 6 weeks  PLANNED INTERVENTIONS: 97110-Therapeutic exercises, 97535- Self Care, and 61607- Manual therapy  PLAN FOR NEXT SESSION: cut foam for LE, begin total decongestive techinques.   Leodis Rainwater, PT CLT [phone]  04/15/2024, 10:16 AM

## 2024-04-18 ENCOUNTER — Ambulatory Visit (HOSPITAL_COMMUNITY): Payer: MEDICAID

## 2024-04-18 ENCOUNTER — Encounter (HOSPITAL_COMMUNITY): Payer: Self-pay

## 2024-04-18 DIAGNOSIS — I890 Lymphedema, not elsewhere classified: Secondary | ICD-10-CM | POA: Diagnosis not present

## 2024-04-18 NOTE — Therapy (Signed)
 OUTPATIENT PHYSICAL THERAPY LYMPHEDEMA EVALUATION  Patient Name: Christine Gomez MRN: [id number] DOB:August 23, 1986, 38 y.o., female Today's Date: 04/18/2024  END OF SESSION:  PT End of Session - 04/18/24 1006     Visit Number 4    Number of Visits 18    Date for PT Re-Evaluation 05/18/24    Authorization Type Marelyn Shan put in    Progress Note Due on Visit 10    PT Start Time 0910    PT Stop Time 1003    PT Time Calculation (min) 53 min    Activity Tolerance Patient tolerated treatment well    Behavior During Therapy [facility] for tasks assessed/performed              Past Medical History:  Diagnosis Date   Acid reflux    Cerebral palsy (HCC)    Past Surgical History:  Procedure Laterality Date   dorsal rhizotomy  1996   back ligament looosening surgery   ESOPHAGOGASTRODUODENOSCOPY (EGD) WITH PROPOFOL  N/A 10/08/2016   Procedure: ESOPHAGOGASTRODUODENOSCOPY (EGD) WITH PROPOFOL ;  Surgeon: Evangeline Hilts, MD;  Location: [facility];  Service: Endoscopy;  Laterality: N/A;   left arm surgery  03/2021   Patient Active Problem List   Diagnosis Date Noted   Dysphagia 05/31/2021   Epigastric pain 05/31/2021   Gastroesophageal reflux disease 05/31/2021   Hematemesis 05/31/2021   Hiatal hernia 05/31/2021   Microcytic anemia 05/31/2021   Sagittal band rupture, extensor tendon, nontraumatic, left 05/01/2021   Cerebral palsy (HCC) 04/25/2020   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   Other acquired deformity of other parts of limb 04/25/2020   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   Deformity of forearm, excluding fingers 04/25/2020   Urinary incontinence 04/25/2020   Iron deficiency anemia due to chronic blood loss 07/10/2017    PCP: Rhett Cella   REFERRING PROVIDER: Armand Lamy, DO  REFERRING DIAG: I89.0 (ICD-10-CM) - Lymphedema, not elsewhere classified  THERAPY DIAG:  I89.0 (ICD-10-CM) -  Lymphedema, not elsewhere classified  Rationale for Evaluation and Treatment: Rehabilitation  ONSET DATE: 06/28/24  SUBJECTIVE:                                                                                                                                                                                           SUBJECTIVE STATEMENT:  Pt arrived with 2 isobands, stated the other bandages have not arrived yet.  Nurse removed and rolled this morning.  Reports she can tell some reduction in volume Lt foot, Rt foot still puffy.  PERTINENT HISTORY: Pt is a spastic quadriplegic CP with contractures  of mm.  Pt received 19 treatments of PT in [place] last year attempting to improve her mobility  PAIN:  Are you having pain? Only with pressure, 4/10   PRECAUTIONS: Other: cellulitis  RED FLAGS: None   WEIGHT BEARING RESTRICTIONS: No; pt needs assistance with transfers uses a wheelchair for locomotion.     FALLS:  Has patient fallen in last 6 months? No  LIVING ENVIRONMENT: Lives with: roomates who do not assist in her care.  She has an aide for 3 hours in the mornings and voc rehab for 4 hours in the evenings.  House/apartment Stairs: No Has following equipment at home: Wheelchair (power)   PRIOR LEVEL OF FUNCTION: Requires assistive device for independence  PATIENT GOALS: To be able to wear shoes again   OBJECTIVE: Note: Objective measures were completed at Evaluation unless otherwise noted.  COGNITION: Overall cognitive status: Within functional limits for tasks assessed   PALPATION: Noted induration of LE with noted B dorsal hump on feet.    LYMPHEDEMA ASSESSMENTS:     LE LANDMARK RIGHT eval 04/15/24  At groin    30 cm proximal to suprapatella    20 cm proximal to suprapatella    10 cm proximal to suprapatella    At midpatella / popliteal crease    30 cm proximal to floor at lateral plantar foot 40.2 39  20 cm proximal to floor at lateral plantar foot 34.2 33  10  cm proximal to floor at lateral plantar foot 27 26.5  Circumference of ankle/heel 33 31.5  5 cm proximal to 1st MTP joint 27 25.6  Across MTP joint 23.9 24.2  Around proximal great toe 8.5 8.1  (Blank rows = not tested)  LE LANDMARK LEFT eval 04/15/24  At groin    30 cm proximal to suprapatella    20 cm proximal to suprapatella    10 cm proximal to suprapatella    At midpatella / popliteal crease    30 cm proximal to floor at lateral plantar foot 42.1 39.7  20 cm proximal to floor at lateral plantar foot 34.7 35  10 cm proximal to floor at lateral plantar foot 26.6 25.7  Circumference of ankle/heel 32.2 31.6  5 cm proximal to 1st MTP joint 25.2 24.4  Across MTP joint 22.3 22  Around proximal great toe 8.8 8.1  (Blank rows = not tested)    TODAY'S TREATMENT:                                                                                                                              DATE:  04/18/24: Manual decongestive techniques anterior only, reclined in power WC to include supraclavicular, deep and superficial abdominal, inguinal/axillary anastomsis and LE.  Manual completed anterior only to B LE.   Compression bandaging using 1/2" foam and multilayer short stretch bandaging from toes to knee B.    04/15/24: PT measured.  Manual decongestive techniques anterior only, reclined in  power WC to include supraclavicular, deep and superficial abdominal, inguinal/axillary anastomsis and LE.  Manual completed anterior only to B LE.  Compression bandaging using 1/2" foam and multilayer short stretch bandaging from toes to knee B.     PATIENT EDUCATION:  Education details: What lymphedema is four aspects to treatment. exercises Person educated: Patient Education method: Explanation, Demonstration, Verbal cues, and Handouts Education comprehension: verbalized understanding  HOME EXERCISE PROGRAM:Sitting:  Ankle pumps, LAQ, hip ab/adduction, marching and diaphragm breathing. (Note pt is only  able to complete less than a 1/4 of ROM due to spastic CP, however, therapist encouraged pt to complete 2x /day as this will still increase lymphatic circulation, maybe not as much as if she could complete full ROM but something is better than nothing.)  ASSESSMENT:  CLINICAL IMPRESSION:   Pt arrived with bandages rolled into one, instructed to roll individually.  Pt with additional isoband that was used this session.  Pt began the manual while therapist rolled bandages with some cueing for proper direction.  Manual decongestive techniques complete anterior aspect in wheelchair that was reclined with focus on inguinal to axillary anastomoses.  LE moisturized well prior application of multilayer short stretch bandages with 1/2in foam.  Reports of comfort at EOS.  OBJECTIVE IMPAIRMENTS: decreased ROM, increased edema, and pain.   ACTIVITY LIMITATIONS: dressing and hygiene/grooming  PERSONAL FACTORS: Fitness and 1 comorbidity: spastic CP  are also affecting patient's functional outcome.   REHAB POTENTIAL: Good  CLINICAL DECISION MAKING: Evolving/moderate complexity  EVALUATION COMPLEXITY: Moderate  GOALS: Goals reviewed with patient? No  SHORT TERM GOALS: Target date: 04/27/24  PT to be I with HEP Baseline: Goal status: met  2.  PT to have lost 2 cm in foot and LE to reduce risk of cellulitis  Baseline:  Goal status: on-going    LONG TERM GOALS: Target date: 05/18/24  PT pain to decrease to no greater than a 2 Baseline:  Goal status: on-going  2.  PT to have lost 4cm  foot measurement to be able to fit into a shoe. Baseline:  Goal status: on-going  3.  PT leg measurements to be decreased 3-4 cm to allow improved skin integrity. (Currently very dry and slighty red).  Baseline:  Goal status: on-going   PLAN:  PT FREQUENCY: 2x/week  PT DURATION: 6 weeks  PLANNED INTERVENTIONS: 97110-Therapeutic exercises, 97535- Self Care, and 16109- Manual therapy  PLAN FOR NEXT  SESSION: total decongestive techinques, wrap to Bil knees, measure on Fridays  Chales Pelissier, LPTA/CLT; Johnye Napoleon [phone]  04/18/2024, 10:08 AM

## 2024-04-20 ENCOUNTER — Encounter (HOSPITAL_COMMUNITY): Payer: MEDICAID

## 2024-04-22 ENCOUNTER — Ambulatory Visit (HOSPITAL_COMMUNITY): Payer: MEDICAID

## 2024-04-22 ENCOUNTER — Encounter (HOSPITAL_COMMUNITY): Payer: Self-pay

## 2024-04-22 DIAGNOSIS — I890 Lymphedema, not elsewhere classified: Secondary | ICD-10-CM

## 2024-04-22 NOTE — Therapy (Signed)
 OUTPATIENT PHYSICAL THERAPY LYMPHEDEMA TREATMENT  Patient Name: Christine Gomez MRN: [id number] DOB:[birth year], 38 y.o., female Today's Date: 04/22/2024  END OF SESSION:  PT End of Session - 04/22/24 1418     Visit Number 5    Number of Visits 18    Date for PT Re-Evaluation 05/18/24    Authorization Type Marelyn Shan put in    Progress Note Due on Visit 10    PT Start Time 1000    PT Stop Time 1100    PT Time Calculation (min) 60 min    Activity Tolerance Patient tolerated treatment well    Behavior During Therapy [facility] for tasks assessed/performed               Past Medical History:  Diagnosis Date   Acid reflux    Cerebral palsy (HCC)    Past Surgical History:  Procedure Laterality Date   dorsal rhizotomy  1996   back ligament looosening surgery   ESOPHAGOGASTRODUODENOSCOPY (EGD) WITH PROPOFOL  N/A 10/08/2016   Procedure: ESOPHAGOGASTRODUODENOSCOPY (EGD) WITH PROPOFOL ;  Surgeon: Evangeline Hilts, MD;  Location: [facility];  Service: Endoscopy;  Laterality: N/A;   left arm surgery  03/2021   Patient Active Problem List   Diagnosis Date Noted   Dysphagia 05/31/2021   Epigastric pain 05/31/2021   Gastroesophageal reflux disease 05/31/2021   Hematemesis 05/31/2021   Hiatal hernia 05/31/2021   Microcytic anemia 05/31/2021   Sagittal band rupture, extensor tendon, nontraumatic, left 05/01/2021   Cerebral palsy (HCC) 04/25/2020   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   Other acquired deformity of other parts of limb 04/25/2020   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   Deformity of forearm, excluding fingers 04/25/2020   Urinary incontinence 04/25/2020   Iron deficiency anemia due to chronic blood loss 07/10/2017    PCP: Rhett Cella   REFERRING PROVIDER: Armand Lamy, DO  REFERRING DIAG: I89.0 (ICD-10-CM) - Lymphedema, not elsewhere classified  THERAPY DIAG:  I89.0 (ICD-10-CM) -  Lymphedema, not elsewhere classified  Rationale for Evaluation and Treatment: Rehabilitation  ONSET DATE: 06/28/24  SUBJECTIVE:                                                                                                                                                                                           SUBJECTIVE STATEMENT:  Pt arrived with 6 new 10cm short stretch bandages.  Stated removed bandaged this morning and already feels increased swelling on feet.  Stated she has returned to some standing with use of WC and feels good.  PERTINENT HISTORY: Pt is a spastic quadriplegic  CP with contractures of mm.  Pt received 19 treatments of PT in [place] last year attempting to improve her mobility  PAIN:  Are you having pain? Only with pressure, 4/10   PRECAUTIONS: Other: cellulitis  RED FLAGS: None   WEIGHT BEARING RESTRICTIONS: No; pt needs assistance with transfers uses a wheelchair for locomotion.     FALLS:  Has patient fallen in last 6 months? No  LIVING ENVIRONMENT: Lives with: roomates who do not assist in her care.  She has an aide for 3 hours in the mornings and voc rehab for 4 hours in the evenings.  House/apartment Stairs: No Has following equipment at home: Wheelchair (power)   PRIOR LEVEL OF FUNCTION: Requires assistive device for independence  PATIENT GOALS: To be able to wear shoes again   OBJECTIVE: Note: Objective measures were completed at Evaluation unless otherwise noted.  COGNITION: Overall cognitive status: Within functional limits for tasks assessed   PALPATION: Noted induration of LE with noted B dorsal hump on feet.    LYMPHEDEMA ASSESSMENTS:     LE LANDMARK RIGHT eval 04/15/24 04/22/24  At groin     30 cm proximal to suprapatella     20 cm proximal to suprapatella     10 cm proximal to suprapatella     At midpatella / popliteal crease     30 cm proximal to floor at lateral plantar foot 40.2 39 38  20 cm proximal to floor at  lateral plantar foot 34.2 33 31.8  10 cm proximal to floor at lateral plantar foot 27 26.5 25.3  Circumference of ankle/heel 33 31.5 30.5  5 cm proximal to 1st MTP joint 27 25.6 23.5  Across MTP joint 23.9 24.2 22.5  Around proximal great toe 8.5 8.1 8.1  (Blank rows = not tested)  LE LANDMARK LEFT eval 04/15/24 04/22/24  At groin     30 cm proximal to suprapatella     20 cm proximal to suprapatella     10 cm proximal to suprapatella     At midpatella / popliteal crease     30 cm proximal to floor at lateral plantar foot 42.1 39.7 39  20 cm proximal to floor at lateral plantar foot 34.7 35 31.5  10 cm proximal to floor at lateral plantar foot 26.6 25.7 24.3  Circumference of ankle/heel 32.2 31.6 29  5  cm proximal to 1st MTP joint 25.2 24.4 23.1  Across MTP joint 22.3 22 21.7  Around proximal great toe 8.8 8.1 8.1  (Blank rows = not tested)    TODAY'S TREATMENT:                                                                                                                              DATE:  04/22/24:  Measurements Manual decongestive techniques anterior only, reclined in power WC to include supraclavicular, deep and superficial abdominal, inguinal/axillary anastomsis and LE.  Manual completed anterior only to B LE.  Compression bandaging using 1/2" foam and multilayer short stretch bandaging from toes to knee B.   04/18/24: Manual decongestive techniques anterior only, reclined in power WC to include supraclavicular, deep and superficial abdominal, inguinal/axillary anastomsis and LE.  Manual completed anterior only to B LE.   Compression bandaging using 1/2" foam and multilayer short stretch bandaging from toes to knee B.    04/15/24: PT measured.  Manual decongestive techniques anterior only, reclined in power WC to include supraclavicular, deep and superficial abdominal, inguinal/axillary anastomsis and LE.  Manual completed anterior only to B LE.  Compression bandaging using 1/2"  foam and multilayer short stretch bandaging from toes to knee B.     PATIENT EDUCATION:  Education details: What lymphedema is four aspects to treatment. exercises Person educated: Patient Education method: Explanation, Demonstration, Verbal cues, and Handouts Education comprehension: verbalized understanding  HOME EXERCISE PROGRAM:Sitting:  Ankle pumps, LAQ, hip ab/adduction, marching and diaphragm breathing. (Note pt is only able to complete less than a 1/4 of ROM due to spastic CP, however, therapist encouraged pt to complete 2x /day as this will still increase lymphatic circulation, maybe not as much as if she could complete full ROM but something is better than nothing.)  ASSESSMENT:  CLINICAL IMPRESSION:   Measurements complete with noted reduction in volume.  Manual decongestive complete with WC reclined to anterior aspect.  Applied good layer of lotion prior application of short stretch bandages with 1/2in foam.  Reports of comfort at EOS.   OBJECTIVE IMPAIRMENTS: decreased ROM, increased edema, and pain.   ACTIVITY LIMITATIONS: dressing and hygiene/grooming  PERSONAL FACTORS: Fitness and 1 comorbidity: spastic CP  are also affecting patient's functional outcome.   REHAB POTENTIAL: Good  CLINICAL DECISION MAKING: Evolving/moderate complexity  EVALUATION COMPLEXITY: Moderate  GOALS: Goals reviewed with patient? No  SHORT TERM GOALS: Target date: 04/27/24  PT to be I with HEP Baseline: Goal status: met  2.  PT to have lost 2 cm in foot and LE to reduce risk of cellulitis  Baseline:  Goal status: on-going    LONG TERM GOALS: Target date: 05/18/24  PT pain to decrease to no greater than a 2 Baseline:  Goal status: on-going  2.  PT to have lost 4cm  foot measurement to be able to fit into a shoe. Baseline:  Goal status: on-going  3.  PT leg measurements to be decreased 3-4 cm to allow improved skin integrity. (Currently very dry and slighty red).  Baseline:   Goal status: on-going   PLAN:  PT FREQUENCY: 2x/week  PT DURATION: 6 weeks  PLANNED INTERVENTIONS: 97110-Therapeutic exercises, 97535- Self Care, and 13086- Manual therapy  PLAN FOR NEXT SESSION: total decongestive techinques, wrap to Bil knees, measure on Fridays  Rishab Stoudt, LPTA/CLT; Johnye Napoleon [phone]  04/22/2024, 2:20 PM

## 2024-04-25 ENCOUNTER — Encounter (HOSPITAL_COMMUNITY): Payer: Self-pay

## 2024-04-25 ENCOUNTER — Ambulatory Visit (HOSPITAL_COMMUNITY): Payer: MEDICAID

## 2024-04-25 DIAGNOSIS — I890 Lymphedema, not elsewhere classified: Secondary | ICD-10-CM

## 2024-04-25 NOTE — Therapy (Signed)
 OUTPATIENT PHYSICAL THERAPY LYMPHEDEMA TREATMENT  Patient Name: Christine Gomez MRN: [id number] DOB:October 05, 1986, 38 y.o., female Today's Date: 04/25/2024  END OF SESSION:  PT End of Session - 04/25/24 0858     Visit Number 6    Number of Visits 18    Date for PT Re-Evaluation 05/18/24    Authorization Type Marelyn Shan put in    Progress Note Due on Visit 10    PT Start Time 0910    PT Stop Time 1008    PT Time Calculation (min) 58 min    Activity Tolerance Patient tolerated treatment well    Behavior During Therapy [facility] for tasks assessed/performed               Past Medical History:  Diagnosis Date   Acid reflux    Cerebral palsy (HCC)    Past Surgical History:  Procedure Laterality Date   dorsal rhizotomy  1996   back ligament looosening surgery   ESOPHAGOGASTRODUODENOSCOPY (EGD) WITH PROPOFOL  N/A 10/08/2016   Procedure: ESOPHAGOGASTRODUODENOSCOPY (EGD) WITH PROPOFOL ;  Surgeon: Evangeline Hilts, MD;  Location: [facility];  Service: Endoscopy;  Laterality: N/A;   left arm surgery  03/2021   Patient Active Problem List   Diagnosis Date Noted   Dysphagia 05/31/2021   Epigastric pain 05/31/2021   Gastroesophageal reflux disease 05/31/2021   Hematemesis 05/31/2021   Hiatal hernia 05/31/2021   Microcytic anemia 05/31/2021   Sagittal band rupture, extensor tendon, nontraumatic, left 05/01/2021   Cerebral palsy (HCC) 04/25/2020   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   Other acquired deformity of other parts of limb 04/25/2020   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   Deformity of forearm, excluding fingers 04/25/2020   Urinary incontinence 04/25/2020   Iron deficiency anemia due to chronic blood loss 07/10/2017    PCP: Rhett Cella   REFERRING PROVIDER: Armand Lamy, DO  REFERRING DIAG: I89.0 (ICD-10-CM) - Lymphedema, not elsewhere classified  THERAPY DIAG:  I89.0 (ICD-10-CM) -  Lymphedema, not elsewhere classified  Rationale for Evaluation and Treatment: Rehabilitation  ONSET DATE: 06/28/24  SUBJECTIVE:                                                                                                                                                                                           SUBJECTIVE STATEMENT:  Pt arrived with bandages unwrapped, stated the nurse arrived late so unable to wrap.  Reports the liner gets itchy.  Has been doing exercises daily and self manual.    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  Pt received 19 treatments of PT in [place] last year attempting to improve her mobility  PAIN:  Are you having pain? Only with pressure, 4/10   PRECAUTIONS: Other: cellulitis  RED FLAGS: None   WEIGHT BEARING RESTRICTIONS: No; pt needs assistance with transfers uses a wheelchair for locomotion.     FALLS:  Has patient fallen in last 6 months? No  LIVING ENVIRONMENT: Lives with: roomates who do not assist in her care.  She has an aide for 3 hours in the mornings and voc rehab for 4 hours in the evenings.  House/apartment Stairs: No Has following equipment at home: Wheelchair (power)   PRIOR LEVEL OF FUNCTION: Requires assistive device for independence  PATIENT GOALS: To be able to wear shoes again   OBJECTIVE: Note: Objective measures were completed at Evaluation unless otherwise noted.  COGNITION: Overall cognitive status: Within functional limits for tasks assessed   PALPATION: Noted induration of LE with noted B dorsal hump on feet.    LYMPHEDEMA ASSESSMENTS:     LE LANDMARK RIGHT eval 04/15/24 04/22/24  At groin     30 cm proximal to suprapatella     20 cm proximal to suprapatella     10 cm proximal to suprapatella     At midpatella / popliteal crease     30 cm proximal to floor at lateral plantar foot 40.2 39 38  20 cm proximal to floor at lateral plantar foot 34.2 33 31.8  10 cm  proximal to floor at lateral plantar foot 27 26.5 25.3  Circumference of ankle/heel 33 31.5 30.5  5 cm proximal to 1st MTP joint 27 25.6 23.5  Across MTP joint 23.9 24.2 22.5  Around proximal great toe 8.5 8.1 8.1  (Blank rows = not tested)  LE LANDMARK LEFT eval 04/15/24 04/22/24  At groin     30 cm proximal to suprapatella     20 cm proximal to suprapatella     10 cm proximal to suprapatella     At midpatella / popliteal crease     30 cm proximal to floor at lateral plantar foot 42.1 39.7 39  20 cm proximal to floor at lateral plantar foot 34.7 35 31.5  10 cm proximal to floor at lateral plantar foot 26.6 25.7 24.3  Circumference of ankle/heel 32.2 31.6 29  5  cm proximal to 1st MTP joint 25.2 24.4 23.1  Across MTP joint 22.3 22 21.7  Around proximal great toe 8.8 8.1 8.1  (Blank rows = not tested)    TODAY'S TREATMENT:                                                                                                                              DATE:  04/22/24:  Measurements Manual decongestive techniques anterior only, reclined in power WC to include supraclavicular, deep and superficial abdominal, inguinal/axillary anastomsis and LE.  Manual completed anterior only to B LE.   Compression bandaging using 1/2"  foam and multilayer short stretch bandaging from toes to knee B.   04/18/24: Manual decongestive techniques anterior only, reclined in power WC to include supraclavicular, deep and superficial abdominal, inguinal/axillary anastomsis and LE.  Manual completed anterior only to B LE.   Compression bandaging using 1/2" foam and multilayer short stretch bandaging from toes to knee B.    04/15/24: PT measured.  Manual decongestive techniques anterior only, reclined in power WC to include supraclavicular, deep and superficial abdominal, inguinal/axillary anastomsis and LE.  Manual completed anterior only to B LE.  Compression bandaging using 1/2" foam and multilayer short stretch  bandaging from toes to knee B.     PATIENT EDUCATION:  Education details: What lymphedema is four aspects to treatment. exercises Person educated: Patient Education method: Explanation, Demonstration, Verbal cues, and Handouts Education comprehension: verbalized understanding  HOME EXERCISE PROGRAM:Sitting:  Ankle pumps, LAQ, hip ab/adduction, marching and diaphragm breathing. (Note pt is only able to complete less than a 1/4 of ROM due to spastic CP, however, therapist encouraged pt to complete 2x /day as this will still increase lymphatic circulation, maybe not as much as if she could complete full ROM but something is better than nothing.)  ASSESSMENT:  CLINICAL IMPRESSION:   Manual decongestive complete in WC reclines to anterior aspect for inguinal to axillary anastomosis.  Applied lotion prior short stretch bandages with 1/2in foam.  Changes liner to the thicker fabric liner to assist with itching, assess improvements next session.    OBJECTIVE IMPAIRMENTS: decreased ROM, increased edema, and pain.   ACTIVITY LIMITATIONS: dressing and hygiene/grooming  PERSONAL FACTORS: Fitness and 1 comorbidity: spastic CP  are also affecting patient's functional outcome.   REHAB POTENTIAL: Good  CLINICAL DECISION MAKING: Evolving/moderate complexity  EVALUATION COMPLEXITY: Moderate  GOALS: Goals reviewed with patient? No  SHORT TERM GOALS: Target date: 04/27/24  PT to be I with HEP Baseline: Goal status: met  2.  PT to have lost 2 cm in foot and LE to reduce risk of cellulitis  Baseline:  Goal status: on-going    LONG TERM GOALS: Target date: 05/18/24  PT pain to decrease to no greater than a 2 Baseline:  Goal status: on-going  2.  PT to have lost 4cm  foot measurement to be able to fit into a shoe. Baseline:  Goal status: on-going  3.  PT leg measurements to be decreased 3-4 cm to allow improved skin integrity. (Currently very dry and slighty red).  Baseline:  Goal  status: on-going   PLAN:  PT FREQUENCY: 2x/week  PT DURATION: 6 weeks  PLANNED INTERVENTIONS: 97110-Therapeutic exercises, 97535- Self Care, and 16109- Manual therapy  PLAN FOR NEXT SESSION: total decongestive techinques, wrap to Bil knees, measure on Fridays.  Assess if pt liked the thicker liner better next session.  Minor Amble, LPTA/CLT; Johnye Napoleon [phone]  04/25/2024, 2:19 PM

## 2024-04-27 ENCOUNTER — Encounter (HOSPITAL_COMMUNITY): Payer: MEDICAID | Admitting: Physical Therapy

## 2024-04-29 ENCOUNTER — Ambulatory Visit (HOSPITAL_COMMUNITY): Payer: MEDICAID | Attending: Family Medicine

## 2024-04-29 ENCOUNTER — Encounter (HOSPITAL_COMMUNITY): Payer: Self-pay

## 2024-04-29 DIAGNOSIS — M6281 Muscle weakness (generalized): Secondary | ICD-10-CM | POA: Insufficient documentation

## 2024-04-29 DIAGNOSIS — G800 Spastic quadriplegic cerebral palsy: Secondary | ICD-10-CM | POA: Insufficient documentation

## 2024-04-29 DIAGNOSIS — I890 Lymphedema, not elsewhere classified: Secondary | ICD-10-CM | POA: Diagnosis present

## 2024-04-29 NOTE — Therapy (Signed)
 OUTPATIENT PHYSICAL THERAPY LYMPHEDEMA TREATMENT  Patient Name: Christine Gomez MRN: [id number] DOB:09/26/1986, 38 y.o., female Today's Date: 04/29/2024  END OF SESSION:  PT End of Session - 04/29/24 1240     Visit Number 7    Number of Visits 18    Date for PT Re-Evaluation 05/18/24    Authorization Type Marelyn Shan put in    Authorization Time Period complete approved 18 visits from 04/06/2024-05/18/2024    Authorization - Visit Number 7    Authorization - Number of Visits 18    Progress Note Due on Visit 10    PT Start Time 1005    PT Stop Time 1110    PT Time Calculation (min) 65 min    Activity Tolerance Patient tolerated treatment well    Behavior During Therapy [facility] for tasks assessed/performed               Past Medical History:  Diagnosis Date   Acid reflux    Cerebral palsy (HCC)    Past Surgical History:  Procedure Laterality Date   dorsal rhizotomy  1996   back ligament looosening surgery   ESOPHAGOGASTRODUODENOSCOPY (EGD) WITH PROPOFOL  N/A 10/08/2016   Procedure: ESOPHAGOGASTRODUODENOSCOPY (EGD) WITH PROPOFOL ;  Surgeon: Evangeline Hilts, MD;  Location: [facility];  Service: Endoscopy;  Laterality: N/A;   left arm surgery  03/2021   Patient Active Problem List   Diagnosis Date Noted   Dysphagia 05/31/2021   Epigastric pain 05/31/2021   Gastroesophageal reflux disease 05/31/2021   Hematemesis 05/31/2021   Hiatal hernia 05/31/2021   Microcytic anemia 05/31/2021   Sagittal band rupture, extensor tendon, nontraumatic, left 05/01/2021   Cerebral palsy (HCC) 04/25/2020   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   Other acquired deformity of other parts of limb 04/25/2020   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   Deformity of forearm, excluding fingers 04/25/2020   Urinary incontinence 04/25/2020   Iron deficiency anemia due to chronic blood loss 07/10/2017    PCP: Rhett Cella    REFERRING PROVIDER: Armand Lamy, DO  REFERRING DIAG: I89.0 (ICD-10-CM) - Lymphedema, not elsewhere classified  THERAPY DIAG:  I89.0 (ICD-10-CM) - Lymphedema, not elsewhere classified  Rationale for Evaluation and Treatment: Rehabilitation  ONSET DATE: 06/28/24  SUBJECTIVE:                                                                                                                                                                                           SUBJECTIVE STATEMENT:  Reports improved tolerance with change to the soft material liner.  No reports of pain.  Arrived with dressings wrapped.  Can tell some reduction in the legs, continues to have swollen foot on dorsal aspect.  Pt stated she has began standing again 3times a week and able to stand for 7 minutes prior fatigue.  She is interested in beginning to walk again and looking into walkers.  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  Pt received 19 treatments of PT in [place] last year attempting to improve her mobility  PAIN:  Are you having pain? Only with pressure, 4/10   PRECAUTIONS: Other: cellulitis  RED FLAGS: None   WEIGHT BEARING RESTRICTIONS: No; pt needs assistance with transfers uses a wheelchair for locomotion.     FALLS:  Has patient fallen in last 6 months? No  LIVING ENVIRONMENT: Lives with: roomates who do not assist in her care.  She has an aide for 3 hours in the mornings and voc rehab for 4 hours in the evenings.  House/apartment Stairs: No Has following equipment at home: Wheelchair (power)   PRIOR LEVEL OF FUNCTION: Requires assistive device for independence  PATIENT GOALS: To be able to wear shoes again   OBJECTIVE: Note: Objective measures were completed at Evaluation unless otherwise noted.  COGNITION: Overall cognitive status: Within functional limits for tasks assessed   PALPATION: Noted induration of LE with noted B dorsal hump on feet.     LYMPHEDEMA ASSESSMENTS:     LE LANDMARK RIGHT eval 04/15/24 04/22/24 04/29/24  At groin      30 cm proximal to suprapatella      20 cm proximal to suprapatella      10 cm proximal to suprapatella      At midpatella / popliteal crease      30 cm proximal to floor at lateral plantar foot 40.2 39 38 37  20 cm proximal to floor at lateral plantar foot 34.2 33 31.8 31  10  cm proximal to floor at lateral plantar foot 27 26.5 25.3 25.3  Circumference of ankle/heel 33 31.5 30.5 29.7  5 cm proximal to 1st MTP joint 27 25.6 23.5 23.4  Across MTP joint 23.9 24.2 22.5 21.5  Around proximal great toe 8.5 8.1 8.1 8.1  (Blank rows = not tested)  LE LANDMARK LEFT eval 04/15/24 04/22/24 04/29/24  At groin      30 cm proximal to suprapatella      20 cm proximal to suprapatella      10 cm proximal to suprapatella      At midpatella / popliteal crease      30 cm proximal to floor at lateral plantar foot 42.1 39.7 39 38.2  20 cm proximal to floor at lateral plantar foot 34.7 35 31.5 31.5  10 cm proximal to floor at lateral plantar foot 26.6 25.7 24.3 23.5  Circumference of ankle/heel 32.2 31.6 29 29  5  cm proximal to 1st MTP joint 25.2 24.4 23.1 22.4  Across MTP joint 22.3 22 21.7 22.3  Around proximal great toe 8.8 8.1 8.1 8  (Blank rows = not tested)    TODAY'S TREATMENT:  DATE:  04/29/24: Measurements Manual decongestive techniques anterior only, reclined in power WC to include supraclavicular, deep and superficial abdominal, inguinal/axillary anastomsis and LE.  Manual completed anterior only to B LE.   Compression bandaging using 1/2" foam and multilayer short stretch bandaging from toes to knee B.  Soft fabric liner #10.  04/25/24: Manual decongestive techniques anterior only, reclined in power WC to include supraclavicular, deep and superficial abdominal,  inguinal/axillary anastomsis and LE.  Manual completed anterior only to B LE.   Compression bandaging using 1/2" foam and multilayer short stretch bandaging from toes to knee B.   04/22/24:  Measurements Manual decongestive techniques anterior only, reclined in power WC to include supraclavicular, deep and superficial abdominal, inguinal/axillary anastomsis and LE.  Manual completed anterior only to B LE.   Compression bandaging using 1/2" foam and multilayer short stretch bandaging from toes to knee B.   04/18/24: Manual decongestive techniques anterior only, reclined in power WC to include supraclavicular, deep and superficial abdominal, inguinal/axillary anastomsis and LE.  Manual completed anterior only to B LE.   Compression bandaging using 1/2" foam and multilayer short stretch bandaging from toes to knee B.    04/15/24: PT measured.  Manual decongestive techniques anterior only, reclined in power WC to include supraclavicular, deep and superficial abdominal, inguinal/axillary anastomsis and LE.  Manual completed anterior only to B LE.  Compression bandaging using 1/2" foam and multilayer short stretch bandaging from toes to knee B.     PATIENT EDUCATION:  Education details: What lymphedema is four aspects to treatment. exercises Person educated: Patient Education method: Explanation, Demonstration, Verbal cues, and Handouts Education comprehension: verbalized understanding  HOME EXERCISE PROGRAM:Sitting:  Ankle pumps, LAQ, hip ab/adduction, marching and diaphragm breathing. (Note pt is only able to complete less than a 1/4 of ROM due to spastic CP, however, therapist encouraged pt to complete 2x /day as this will still increase lymphatic circulation, maybe not as much as if she could complete full ROM but something is better than nothing.)  ASSESSMENT:  CLINICAL IMPRESSION:   Measurements complete with good reduction though continues to have edema present dorsal aspect of feet.  Manual  decongestive techniques complete anterior aspect with WC reclined, increased focus with 50 strokes to feet.  Moisturized well prior application of multilayer short stretch bandages.  Continued with the soft fabric liner.  OBJECTIVE IMPAIRMENTS: decreased ROM, increased edema, and pain.   ACTIVITY LIMITATIONS: dressing and hygiene/grooming  PERSONAL FACTORS: Fitness and 1 comorbidity: spastic CP  are also affecting patient's functional outcome.   REHAB POTENTIAL: Good  CLINICAL DECISION MAKING: Evolving/moderate complexity  EVALUATION COMPLEXITY: Moderate  GOALS: Goals reviewed with patient? No  SHORT TERM GOALS: Target date: 04/27/24  PT to be I with HEP Baseline: Goal status: met  2.  PT to have lost 2 cm in foot and LE to reduce risk of cellulitis  Baseline:  Goal status: on-going    LONG TERM GOALS: Target date: 05/18/24  PT pain to decrease to no greater than a 2 Baseline:  Goal status: on-going  2.  PT to have lost 4cm  foot measurement to be able to fit into a shoe. Baseline:  Goal status: on-going  3.  PT leg measurements to be decreased 3-4 cm to allow improved skin integrity. (Currently very dry and slighty red).  Baseline:  Goal status: on-going   PLAN:  PT FREQUENCY: 2x/week  PT DURATION: 6 weeks  PLANNED INTERVENTIONS: 97110-Therapeutic exercises, 97535- Self Care, and 81191- Manual therapy  PLAN FOR NEXT SESSION: total decongestive techinques, wrap to Bil knees, measure on Fridays.  Trial with orange foam on feet.    Minor Amble, LPTA/CLT; Johnye Napoleon [phone]  04/29/2024, 12:43 PM

## 2024-05-02 ENCOUNTER — Ambulatory Visit (HOSPITAL_COMMUNITY): Payer: MEDICAID

## 2024-05-02 ENCOUNTER — Encounter (HOSPITAL_COMMUNITY): Payer: Self-pay

## 2024-05-02 DIAGNOSIS — I890 Lymphedema, not elsewhere classified: Secondary | ICD-10-CM | POA: Diagnosis not present

## 2024-05-02 NOTE — Therapy (Signed)
 OUTPATIENT PHYSICAL THERAPY LYMPHEDEMA TREATMENT  Patient Name: Christine Gomez MRN: [id number] DOB:04-13-1986, 38 y.o., female Today's Date: 05/02/2024  END OF SESSION:  PT End of Session - 05/02/24 0959     Visit Number 8    Number of Visits 18    Date for PT Re-Evaluation 05/18/24    Authorization Type Marelyn Shan put in    Authorization Time Period complete approved 18 visits from 04/06/2024-05/18/2024    Authorization - Visit Number 8    Authorization - Number of Visits 18    Progress Note Due on Visit 10    PT Start Time 0900    PT Stop Time 0955    PT Time Calculation (min) 55 min    Activity Tolerance Patient tolerated treatment well    Behavior During Therapy [facility] for tasks assessed/performed               Past Medical History:  Diagnosis Date   Acid reflux    Cerebral palsy (HCC)    Past Surgical History:  Procedure Laterality Date   dorsal rhizotomy  1996   back ligament looosening surgery   ESOPHAGOGASTRODUODENOSCOPY (EGD) WITH PROPOFOL  N/A 10/08/2016   Procedure: ESOPHAGOGASTRODUODENOSCOPY (EGD) WITH PROPOFOL ;  Surgeon: Evangeline Hilts, MD;  Location: [facility];  Service: Endoscopy;  Laterality: N/A;   left arm surgery  03/2021   Patient Active Problem List   Diagnosis Date Noted   Dysphagia 05/31/2021   Epigastric pain 05/31/2021   Gastroesophageal reflux disease 05/31/2021   Hematemesis 05/31/2021   Hiatal hernia 05/31/2021   Microcytic anemia 05/31/2021   Sagittal band rupture, extensor tendon, nontraumatic, left 05/01/2021   Cerebral palsy (HCC) 04/25/2020   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   Other acquired deformity of other parts of limb 04/25/2020   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   Deformity of forearm, excluding fingers 04/25/2020   Urinary incontinence 04/25/2020   Iron deficiency anemia due to chronic blood loss 07/10/2017    PCP: Rhett Cella    REFERRING PROVIDER: Armand Lamy, DO  REFERRING DIAG: I89.0 (ICD-10-CM) - Lymphedema, not elsewhere classified  THERAPY DIAG:  I89.0 (ICD-10-CM) - Lymphedema, not elsewhere classified  Rationale for Evaluation and Treatment: Rehabilitation  ONSET DATE: 06/28/24  SUBJECTIVE:                                                                                                                                                                                           SUBJECTIVE STATEMENT:  Pt arrived wearing clogs, stated first time able in months with a smile.  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  Pt received 19 treatments of PT in [place] last year attempting to improve her mobility  PAIN:  Are you having pain? Only with pressure, 4/10   PRECAUTIONS: Other: cellulitis  RED FLAGS: None   WEIGHT BEARING RESTRICTIONS: No; pt needs assistance with transfers uses a wheelchair for locomotion.     FALLS:  Has patient fallen in last 6 months? No  LIVING ENVIRONMENT: Lives with: roomates who do not assist in her care.  She has an aide for 3 hours in the mornings and voc rehab for 4 hours in the evenings.  House/apartment Stairs: No Has following equipment at home: Wheelchair (power)   PRIOR LEVEL OF FUNCTION: Requires assistive device for independence  PATIENT GOALS: To be able to wear shoes again   OBJECTIVE: Note: Objective measures were completed at Evaluation unless otherwise noted.  COGNITION: Overall cognitive status: Within functional limits for tasks assessed   PALPATION: Noted induration of LE with noted B dorsal hump on feet.    LYMPHEDEMA ASSESSMENTS:     LE LANDMARK RIGHT eval 04/15/24 04/22/24 04/29/24  At groin      30 cm proximal to suprapatella      20 cm proximal to suprapatella      10 cm proximal to suprapatella      At midpatella / popliteal crease      30 cm proximal to floor at lateral plantar foot 40.2 39 38 37   20 cm proximal to floor at lateral plantar foot 34.2 33 31.8 31  10  cm proximal to floor at lateral plantar foot 27 26.5 25.3 25.3  Circumference of ankle/heel 33 31.5 30.5 29.7  5 cm proximal to 1st MTP joint 27 25.6 23.5 23.4  Across MTP joint 23.9 24.2 22.5 21.5  Around proximal great toe 8.5 8.1 8.1 8.1  (Blank rows = not tested)  LE LANDMARK LEFT eval 04/15/24 04/22/24 04/29/24  At groin      30 cm proximal to suprapatella      20 cm proximal to suprapatella      10 cm proximal to suprapatella      At midpatella / popliteal crease      30 cm proximal to floor at lateral plantar foot 42.1 39.7 39 38.2  20 cm proximal to floor at lateral plantar foot 34.7 35 31.5 31.5  10 cm proximal to floor at lateral plantar foot 26.6 25.7 24.3 23.5  Circumference of ankle/heel 32.2 31.6 29 29  5  cm proximal to 1st MTP joint 25.2 24.4 23.1 22.4  Across MTP joint 22.3 22 21.7 22.3  Around proximal great toe 8.8 8.1 8.1 8  (Blank rows = not tested)    TODAY'S TREATMENT:                                                                                                                              DATE:  05/02/24: Manual decongestive techniques anterior only, reclined in power WC to include supraclavicular, deep and superficial abdominal, inguinal/axillary anastomsis and LE.  Manual completed anterior only to B LE.   Compression bandaging using 1/2" foam and multilayer short stretch bandaging from toes to knee B. Soft material liner #10, orange foam on dorsal aspect  04/29/24: Measurements Manual decongestive techniques anterior only, reclined in power WC to include supraclavicular, deep and superficial abdominal, inguinal/axillary anastomsis and LE.  Manual completed anterior only to B LE.   Compression bandaging using 1/2" foam and multilayer short stretch bandaging from toes to knee B.  Soft fabric liner #10.  04/25/24: Manual decongestive techniques anterior only, reclined in power WC to include  supraclavicular, deep and superficial abdominal, inguinal/axillary anastomsis and LE.  Manual completed anterior only to B LE.   Compression bandaging using 1/2" foam and multilayer short stretch bandaging from toes to knee B.   04/22/24:  Measurements Manual decongestive techniques anterior only, reclined in power WC to include supraclavicular, deep and superficial abdominal, inguinal/axillary anastomsis and LE.  Manual completed anterior only to B LE.   Compression bandaging using 1/2" foam and multilayer short stretch bandaging from toes to knee B.   04/18/24: Manual decongestive techniques anterior only, reclined in power WC to include supraclavicular, deep and superficial abdominal, inguinal/axillary anastomsis and LE.  Manual completed anterior only to B LE.   Compression bandaging using 1/2" foam and multilayer short stretch bandaging from toes to knee B.    04/15/24: PT measured.  Manual decongestive techniques anterior only, reclined in power WC to include supraclavicular, deep and superficial abdominal, inguinal/axillary anastomsis and LE.  Manual completed anterior only to B LE.  Compression bandaging using 1/2" foam and multilayer short stretch bandaging from toes to knee B.     PATIENT EDUCATION:  Education details: What lymphedema is four aspects to treatment. exercises Person educated: Patient Education method: Explanation, Demonstration, Verbal cues, and Handouts Education comprehension: verbalized understanding  HOME EXERCISE PROGRAM:Sitting:  Ankle pumps, LAQ, hip ab/adduction, marching and diaphragm breathing. (Note pt is only able to complete less than a 1/4 of ROM due to spastic CP, however, therapist encouraged pt to complete 2x /day as this will still increase lymphatic circulation, maybe not as much as if she could complete full ROM but something is better than nothing.)  ASSESSMENT:  CLINICAL IMPRESSION:   Manual decongestive techniques complete anterior aspect with  WC reclined with continued focus on dorsal aspect of feet.  Moisturized well prior application of multilayer short stretch bandages with soft fabric liner.  Changed to orange foam to address the edema present dorsal aspect.  Reports of comfort.  [facility]ed [facility] concerning pump, neither have received paperwork.  Will discuss with evaluating therapist.  OBJECTIVE IMPAIRMENTS: decreased ROM, increased edema, and pain.   ACTIVITY LIMITATIONS: dressing and hygiene/grooming  PERSONAL FACTORS: Fitness and 1 comorbidity: spastic CP  are also affecting patient's functional outcome.   REHAB POTENTIAL: Good  CLINICAL DECISION MAKING: Evolving/moderate complexity  EVALUATION COMPLEXITY: Moderate  GOALS: Goals reviewed with patient? No  SHORT TERM GOALS: Target date: 04/27/24  PT to be I with HEP Baseline: Goal status: met  2.  PT to have lost 2 cm in foot and LE to reduce risk of cellulitis  Baseline:  Goal status: on-going    LONG TERM GOALS: Target date: 05/18/24  PT pain to decrease to no greater than a 2 Baseline:  Goal status: on-going  2.  PT to have  lost 4cm  foot measurement to be able to fit into a shoe. Baseline:  Goal status: on-going  3.  PT leg measurements to be decreased 3-4 cm to allow improved skin integrity. (Currently very dry and slighty red).  Baseline:  Goal status: on-going   PLAN:  PT FREQUENCY: 2x/week  PT DURATION: 6 weeks  PLANNED INTERVENTIONS: 97110-Therapeutic exercises, 97535- Self Care, and 29562- Manual therapy  PLAN FOR NEXT SESSION: total decongestive techinques, wrap to Bil knees, measure on Fridays.    Minor Amble, LPTA/CLT; Johnye Napoleon [phone]  05/02/2024, 10:08 AM

## 2024-05-04 ENCOUNTER — Encounter (HOSPITAL_COMMUNITY): Payer: MEDICAID | Admitting: Physical Therapy

## 2024-05-06 ENCOUNTER — Ambulatory Visit (HOSPITAL_COMMUNITY): Payer: MEDICAID | Admitting: Physical Therapy

## 2024-05-06 DIAGNOSIS — I890 Lymphedema, not elsewhere classified: Secondary | ICD-10-CM | POA: Diagnosis not present

## 2024-05-06 NOTE — Therapy (Signed)
 OUTPATIENT PHYSICAL THERAPY LYMPHEDEMA TREATMENT/prog  Patient Name: Christine Gomez MRN: [id number] DOB:1986-04-08, 38 y.o., female Today's Date: 05/06/2024 Progress Note Reporting Period 04/06/24 to 05/06/24  See note below for Objective Data and Assessment of Progress/Goals.     END OF SESSION:  PT End of Session - 05/06/24 1014     Visit Number 9    Number of Visits 18    Date for PT Re-Evaluation 05/18/24    Authorization Type Marelyn Shan put in    Authorization Time Period complete approved 18 visits from 04/06/2024-05/18/2024    Authorization - Visit Number 9    Authorization - Number of Visits 18    Progress Note Due on Visit 18    PT Start Time 0905    PT Stop Time 1015    PT Time Calculation (min) 70 min    Activity Tolerance Patient tolerated treatment well    Behavior During Therapy [facility] for tasks assessed/performed                Past Medical History:  Diagnosis Date   Acid reflux    Cerebral palsy (HCC)    Past Surgical History:  Procedure Laterality Date   dorsal rhizotomy  1996   back ligament looosening surgery   ESOPHAGOGASTRODUODENOSCOPY (EGD) WITH PROPOFOL  N/A 10/08/2016   Procedure: ESOPHAGOGASTRODUODENOSCOPY (EGD) WITH PROPOFOL ;  Surgeon: Evangeline Hilts, MD;  Location: [facility];  Service: Endoscopy;  Laterality: N/A;   left arm surgery  03/2021   Patient Active Problem List   Diagnosis Date Noted   Dysphagia 05/31/2021   Epigastric pain 05/31/2021   Gastroesophageal reflux disease 05/31/2021   Hematemesis 05/31/2021   Hiatal hernia 05/31/2021   Microcytic anemia 05/31/2021   Sagittal band rupture, extensor tendon, nontraumatic, left 05/01/2021   Cerebral palsy (HCC) 04/25/2020   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   Other acquired deformity of other parts of limb 04/25/2020   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  Pt comes from [place] and is only able to come 2x a week due to transportation.   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  Pt received 19 treatments of PT in [place] last year attempting to improve her mobility  PAIN:  Are you having pain? Only with pressure, 4/10   PRECAUTIONS: Other: cellulitis  RED FLAGS: None   WEIGHT BEARING RESTRICTIONS: No; pt needs assistance with transfers uses a wheelchair for locomotion.     FALLS:  Has patient fallen in last 6 months? No  LIVING ENVIRONMENT: Lives with: roomates who do not assist in her care.  She has an aide for 3 hours in the mornings and voc rehab for 4 hours in the evenings.  House/apartment Stairs: No Has following equipment at home: Wheelchair (power)   PRIOR LEVEL OF FUNCTION: Requires assistive device for independence  PATIENT GOALS: To be able to wear shoes again   OBJECTIVE: Note: Objective measures were completed at Evaluation unless otherwise noted.  COGNITION: Overall cognitive status: Within functional limits for tasks assessed   PALPATION: Noted induration of LE with noted B dorsal hump on feet.    LYMPHEDEMA ASSESSMENTS:     LE LANDMARK RIGHT eval 04/15/24 04/22/24 04/29/24 05/06/24  At groin       30 cm proximal  to suprapatella       20 cm proximal to suprapatella       10 cm proximal to suprapatella       At midpatella / popliteal crease       30 cm proximal to floor at lateral plantar foot 40.2 39 38 37 36.3  20 cm proximal to floor at lateral plantar foot 34.2 33 31.8 31 31.2  10 cm proximal to floor at lateral plantar foot 27 26.5 25.3 25.3 24.4  Circumference of ankle/heel 33 31.5 30.5 29.7 31.5  5 cm proximal to 1st MTP joint 27 25.6 23.5 23.4 24  Across MTP joint 23.9 24.2 22.5 21.5 23  Around proximal great toe 8.5 8.1 8.1 8.1 8  (Blank rows = not tested)  LE LANDMARK LEFT eval 04/15/24 04/22/24 04/29/24 05/06/24  At groin       30 cm proximal to suprapatella       20 cm proximal to suprapatella       10 cm proximal to suprapatella       At midpatella / popliteal crease       30 cm proximal to floor at lateral plantar foot 42.1 39.7 39 38.2 40  20 cm proximal to floor at lateral plantar foot 34.7 35 31.5 31.5 32.5  10 cm proximal to floor at lateral plantar foot 26.6 25.7 24.3 23.5 24.5  Circumference of ankle/heel 32.2 31.6 29 29  30.5  5 cm proximal to 1st MTP joint 25.2 24.4 23.1 22.4 23.5  Across MTP joint 22.3 22 21.7 22.3 22.4  Around proximal great toe 8.8 8.1 8.1 8 8.2  (Blank rows = not tested)    TODAY'S TREATMENT:  DATE:  05/06/24: Measurement Manual decongestive techniques anterior only, reclined in power WC to include supraclavicular, deep and superficial abdominal, inguinal/axillary anastomsis and LE.  Manual completed anterior only to B LE.   Compression bandaging using 1/2" foam and multilayer short stretch bandaging from toes to knee B. Soft material liner #10, orange foam on dorsal aspect  Eval:  Education details: What lymphedema is four aspects to treatment. exercises Person educated: Patient Education method: Explanation, Demonstration, Verbal  cues, and Handouts Education comprehension: verbalized understanding  HOME EXERCISE PROGRAM:Sitting:  Ankle pumps, LAQ, hip ab/adduction, marching and diaphragm breathing. (Note pt is only able to complete less than a 1/4 of ROM due to spastic CP, however, therapist encouraged pt to complete 2x /day as this will still increase lymphatic circulation, maybe not as much as if she could complete full ROM but something is better than nothing.)  ASSESSMENT:  CLINICAL IMPRESSION:  Therapist discussed options of compression with pt.  Pt desires to go with foot/leg juxta fit compression vs. Traditional as with her CP she wants what will be easier to don for her aide.  Pt has increased slightly which may be due to pt only being able to come 2x /week.  Therapist will contact [facility] re juxta fit for pt.  Continue to use orange foam on dorsal aspect of feet to address stubborn edema.   OBJECTIVE IMPAIRMENTS: decreased ROM, increased edema, and pain.   ACTIVITY LIMITATIONS: dressing and hygiene/grooming  PERSONAL FACTORS: Fitness and 1 comorbidity: spastic CP are also affecting patient's functional outcome.   REHAB POTENTIAL: Good  CLINICAL DECISION MAKING: Evolving/moderate complexity  EVALUATION COMPLEXITY: Moderate  GOALS: Goals reviewed with patient? No  SHORT TERM GOALS: Target date: 04/27/24  PT to be I with HEP Baseline: Goal status: met  2.  PT to have lost 2 cm in foot and LE to reduce risk of cellulitis  Baseline:  Goal status: met   LONG TERM GOALS: Target date: 05/18/24  PT pain to decrease to no greater than a 2 Baseline:  Goal status: met  2.  PT to have lost 4cm  foot measurement to be able to fit into a shoe. Baseline:  Goal status: on-going  3.  PT leg measurements to be decreased 3-4 cm to allow improved skin integrity. (Currently very dry and slighty red).  Baseline:  Goal status: on-going   PLAN:  PT FREQUENCY: 2x/week  PT DURATION: 6 weeks  PLANNED  INTERVENTIONS: 97110-Therapeutic exercises, 97535- Self Care, and 03474- Manual therapy  PLAN FOR NEXT SESSION: total decongestive techinques, wrap to Bil knees, measure on Fridays.    Leodis Rainwater, PT CLT [phone]    05/06/2024, 10:20 AM

## 2024-05-09 ENCOUNTER — Encounter (HOSPITAL_COMMUNITY): Payer: Self-pay

## 2024-05-09 ENCOUNTER — Ambulatory Visit (HOSPITAL_COMMUNITY): Payer: MEDICAID

## 2024-05-09 DIAGNOSIS — I890 Lymphedema, not elsewhere classified: Secondary | ICD-10-CM

## 2024-05-09 NOTE — Therapy (Signed)
 OUTPATIENT PHYSICAL THERAPY LYMPHEDEMA TREATMENT/prog  Patient Name: Christine Gomez MRN: [id number] DOB:Nov 17, 1986, 38 y.o., female Today's Date: 05/09/2024 Progress Note Reporting Period 04/06/24 to 05/06/24  See note below for Objective Data and Assessment of Progress/Goals.     END OF SESSION:  PT End of Session - 05/09/24 1111     Visit Number 10    Number of Visits 18    Date for PT Re-Evaluation 05/18/24    Authorization Type Marelyn Shan put in    Authorization Time Period complete approved 18 visits from 04/06/2024-05/18/2024    Authorization - Visit Number 10    Authorization - Number of Visits 18    Progress Note Due on Visit 18    PT Start Time 1015    PT Stop Time 1112    PT Time Calculation (min) 57 min    Activity Tolerance Patient tolerated treatment well    Behavior During Therapy WFL for tasks assessed/performed                Past Medical History:  Diagnosis Date   Acid reflux    Cerebral palsy (HCC)    Past Surgical History:  Procedure Laterality Date   dorsal rhizotomy  1996   back ligament looosening surgery   ESOPHAGOGASTRODUODENOSCOPY (EGD) WITH PROPOFOL  N/A 10/08/2016   Procedure: ESOPHAGOGASTRODUODENOSCOPY (EGD) WITH PROPOFOL ;  Surgeon: Evangeline Hilts, MD;  Location: [facility];  Service: Endoscopy;  Laterality: N/A;   left arm surgery  03/2021   Patient Active Problem List   Diagnosis Date Noted   Dysphagia 05/31/2021   Epigastric pain 05/31/2021   Gastroesophageal reflux disease 05/31/2021   Hematemesis 05/31/2021   Hiatal hernia 05/31/2021   Microcytic anemia 05/31/2021   Sagittal band rupture, extensor tendon, nontraumatic, left 05/01/2021   Cerebral palsy (HCC) 04/25/2020   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   Other acquired deformity of other parts of limb 04/25/2020   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  Removed bandages this morning and feels her Lt foot looks almost normal, continues to have swelling Rt foot.    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  Pt received 19 treatments of PT in [place] last year attempting to improve her mobility  PAIN:  Are you having pain? Only with pressure, 4/10   PRECAUTIONS: Other: cellulitis  RED FLAGS: None   WEIGHT BEARING RESTRICTIONS: No; pt needs assistance with transfers uses a wheelchair for locomotion.     FALLS:  Has patient fallen in last 6 months? No  LIVING ENVIRONMENT: Lives with: roomates who do not assist in her care.  She has an aide for 3 hours in the mornings and voc rehab for 4 hours in the evenings.  House/apartment Stairs: No Has following equipment at home: Wheelchair (power)   PRIOR LEVEL OF FUNCTION: Requires assistive device for independence  PATIENT GOALS: To be able to wear shoes again   OBJECTIVE: Note: Objective measures were completed at Evaluation unless otherwise noted.  COGNITION: Overall cognitive status: Within functional limits for tasks assessed   PALPATION: Noted induration of LE with noted B dorsal hump on feet.    LYMPHEDEMA ASSESSMENTS:     LE LANDMARK RIGHT eval 04/15/24 04/22/24 04/29/24 05/06/24  At groin       30  cm proximal to suprapatella       20 cm proximal to suprapatella       10 cm proximal to suprapatella       At midpatella / popliteal crease       30 cm proximal to floor at lateral plantar foot 40.2 39 38 37 36.3  20 cm proximal to floor at lateral plantar foot 34.2 33 31.8 31 31.2  10 cm proximal to floor at lateral plantar foot 27 26.5 25.3 25.3 24.4  Circumference of ankle/heel 33 31.5 30.5 29.7 31.5  5 cm proximal to 1st MTP joint 27 25.6 23.5 23.4 24  Across MTP joint 23.9 24.2 22.5 21.5 23  Around proximal great toe 8.5 8.1 8.1 8.1 8  (Blank rows = not tested)  LE LANDMARK LEFT eval 04/15/24 04/22/24 04/29/24 05/06/24  At groin       30 cm proximal to suprapatella       20 cm proximal to suprapatella       10 cm proximal to suprapatella       At midpatella / popliteal crease       30 cm proximal to floor at lateral plantar foot 42.1 39.7 39 38.2 40  20 cm proximal to floor at lateral plantar foot 34.7 35 31.5 31.5 32.5  10 cm proximal to floor at lateral plantar foot 26.6 25.7 24.3 23.5 24.5  Circumference of ankle/heel 32.2 31.6 29 29  30.5  5 cm proximal to 1st MTP joint 25.2 24.4 23.1 22.4 23.5  Across MTP joint 22.3 22 21.7 22.3 22.4  Around proximal great toe 8.8 8.1 8.1 8 8.2  (Blank rows = not tested)    TODAY'S TREATMENT:  DATE:  05/06/24: Measurement Manual decongestive techniques anterior only, reclined in power WC to include supraclavicular, deep and superficial abdominal, inguinal/axillary anastomsis and LE.  Manual completed anterior only to B LE.   Compression bandaging using 1/2" foam and multilayer short stretch bandaging from toes to knee B. Soft material liner #10, orange foam on dorsal aspect  Eval:  Education details: What lymphedema is four aspects to treatment. exercises Person educated: Patient Education method: Explanation,  Demonstration, Verbal cues, and Handouts Education comprehension: verbalized understanding  HOME EXERCISE PROGRAM:Sitting:  Ankle pumps, LAQ, hip ab/adduction, marching and diaphragm breathing. (Note pt is only able to complete less than a 1/4 of ROM due to spastic CP, however, therapist encouraged pt to complete 2x /day as this will still increase lymphatic circulation, maybe not as much as if she could complete full ROM but something is better than nothing.)  ASSESSMENT:  CLINICAL IMPRESSION:   Manual decongestive techniuqes complete anterior aspect with main focus on dorsal aspect of feet Rt>Lt with ~100 strokes.  Applied heavy layer of lotion prior application of multilayer short stretch bandages with orange foam over feet.  Noted red scratch on Rt foot, pt stated bandages shifted over weekend and may have irritated.  Extra cotton placed under foam for skin integrity.  Reports of comfort at EOS.  OBJECTIVE IMPAIRMENTS: decreased ROM, increased edema, and pain.   ACTIVITY LIMITATIONS: dressing and hygiene/grooming  PERSONAL FACTORS: Fitness and 1 comorbidity: spastic CP are also affecting patient's functional outcome.   REHAB POTENTIAL: Good  CLINICAL DECISION MAKING: Evolving/moderate complexity  EVALUATION COMPLEXITY: Moderate  GOALS: Goals reviewed with patient? No  SHORT TERM GOALS: Target date: 04/27/24  PT to be I with HEP Baseline: Goal status: met  2.  PT to have lost 2 cm in foot and LE to reduce risk of cellulitis  Baseline:  Goal status: met   LONG TERM GOALS: Target date: 05/18/24  PT pain to decrease to no greater than a 2 Baseline:  Goal status: met  2.  PT to have lost 4cm  foot measurement to be able to fit into a shoe. Baseline:  Goal status: on-going  3.  PT leg measurements to be decreased 3-4 cm to allow improved skin integrity. (Currently very dry and slighty red).  Baseline:  Goal status: on-going   PLAN:  PT FREQUENCY: 2x/week  PT  DURATION: 6 weeks  PLANNED INTERVENTIONS: 97110-Therapeutic exercises, 97535- Self Care, and 40981- Manual therapy  PLAN FOR NEXT SESSION: total decongestive techinques, wrap to Bil knees, measure on Fridays.    Minor Amble, LPTA/CLT; Johnye Napoleon [phone]  05/09/2024, 11:13 AM

## 2024-05-11 ENCOUNTER — Encounter (HOSPITAL_COMMUNITY): Payer: MEDICAID | Admitting: Physical Therapy

## 2024-05-13 ENCOUNTER — Ambulatory Visit (HOSPITAL_COMMUNITY): Payer: MEDICAID | Admitting: Physical Therapy

## 2024-05-13 DIAGNOSIS — I890 Lymphedema, not elsewhere classified: Secondary | ICD-10-CM

## 2024-05-13 NOTE — Therapy (Signed)
 OUTPATIENT PHYSICAL THERAPY LYMPHEDEMA TREATMENT/prog  Patient Name: Christine Gomez MRN: [id number] DOB:28-Aug-1986, 38 y.o., female Today's Date: 05/13/2024    END OF SESSION:  PT End of Session - 05/13/24 1217     Visit Number 11    Number of Visits 18    Date for PT Re-Evaluation 05/18/24    Authorization Type Marelyn Shan put in    Authorization Time Period complete approved 18 visits from 04/06/2024-05/18/2024    Authorization - Visit Number 11    Authorization - Number of Visits 18    Progress Note Due on Visit 18    PT Start Time 1018    PT Stop Time 1120    PT Time Calculation (min) 62 min    Activity Tolerance Patient tolerated treatment well    Behavior During Therapy WFL for tasks assessed/performed                 Past Medical History:  Diagnosis Date   Acid reflux    Cerebral palsy (HCC)    Past Surgical History:  Procedure Laterality Date   dorsal rhizotomy  1996   back ligament looosening surgery   ESOPHAGOGASTRODUODENOSCOPY (EGD) WITH PROPOFOL  N/A 10/08/2016   Procedure: ESOPHAGOGASTRODUODENOSCOPY (EGD) WITH PROPOFOL ;  Surgeon: Evangeline Hilts, MD;  Location: [facility];  Service: Endoscopy;  Laterality: N/A;   left arm surgery  03/2021   Patient Active Problem List   Diagnosis Date Noted   Dysphagia 05/31/2021   Epigastric pain 05/31/2021   Gastroesophageal reflux disease 05/31/2021   Hematemesis 05/31/2021   Hiatal hernia 05/31/2021   Microcytic anemia 05/31/2021   Sagittal band rupture, extensor tendon, nontraumatic, left 05/01/2021   Cerebral palsy (HCC) 04/25/2020   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   Other acquired deformity of other parts of limb 04/25/2020   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   Deformity of forearm, excluding fingers 04/25/2020   Urinary incontinence 04/25/2020   Iron deficiency anemia due to chronic blood loss 07/10/2017    PCP:  Rhett Cella   REFERRING PROVIDER: Armand Lamy, DO  REFERRING DIAG: I89.0 (ICD-10-CM) - Lymphedema, not elsewhere classified  THERAPY DIAG:  I89.0 (ICD-10-CM) - Lymphedema, not elsewhere classified  Rationale for Evaluation and Treatment: Rehabilitation  ONSET DATE: 06/28/24  SUBJECTIVE:                                                                                                                                                                                           SUBJECTIVE STATEMENT:  Pt states that  she has been contacted by [facility] who  is in process to obtain her pump.  Pt heard from [facility] as well [facility] is in process as well.  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  Pt received 19 treatments of PT in [place] last year attempting to improve her mobility  PAIN:  Are you having pain? Only with pressure, 4/10   PRECAUTIONS: Other: cellulitis  RED FLAGS: None   WEIGHT BEARING RESTRICTIONS: No; pt needs assistance with transfers uses a wheelchair for locomotion.     FALLS:  Has patient fallen in last 6 months? No  LIVING ENVIRONMENT: Lives with: roomates who do not assist in her care.  She has an aide for 3 hours in the mornings and voc rehab for 4 hours in the evenings.  House/apartment Stairs: No Has following equipment at home: Wheelchair (power)   PRIOR LEVEL OF FUNCTION: Requires assistive device for independence  PATIENT GOALS: To be able to wear shoes again   OBJECTIVE: Note: Objective measures were completed at Evaluation unless otherwise noted.  COGNITION: Overall cognitive status: Within functional limits for tasks assessed   PALPATION: Noted induration of LE with noted B dorsal hump on feet.    LYMPHEDEMA ASSESSMENTS:     LE LANDMARK RIGHT eval 04/15/24 04/22/24 04/29/24 05/06/24 05/13/24  At groin        30 cm proximal to suprapatella        20 cm proximal to suprapatella         10 cm proximal to suprapatella        At midpatella / popliteal crease        30 cm proximal to floor at lateral plantar foot 40.2 39 38 37 36.3 36.7  20 cm proximal to floor at lateral plantar foot 34.2 33 31.8 31 31.2 30.5  10 cm proximal to floor at lateral plantar foot 27 26.5 25.3 25.3 24.4 25  Circumference of ankle/heel 33 31.5 30.5 29.7 31.5 31  5  cm proximal to 1st MTP joint 27 25.6 23.5 23.4 24 24.2  Across MTP joint 23.9 24.2 22.5 21.5 23 20.8  Around proximal great toe 8.5 8.1 8.1 8.1 8 8.3  (Blank rows = not tested)  LE LANDMARK LEFT eval 04/15/24 04/22/24 04/29/24 05/06/24 05/13/24  At groin        30 cm proximal to suprapatella        20 cm proximal to suprapatella        10 cm proximal to suprapatella        At midpatella / popliteal crease        30 cm proximal to floor at lateral plantar foot 42.1 39.7 39 38.2 40 39  20 cm proximal to floor at lateral plantar foot 34.7 35 31.5 31.5 32.5 31.8  10 cm proximal to floor at lateral plantar foot 26.6 25.7 24.3 23.5 24.5 25  Circumference of ankle/heel 32.2 31.6 29 29  30.5 30.1  5 cm proximal to 1st MTP joint 25.2 24.4 23.1 22.4 23.5 24  Across MTP joint 22.3 22 21.7 22.3 22.4 22.3  Around proximal great toe 8.8 8.1 8.1 8 8.2   (Blank rows = not tested)    TODAY'S TREATMENT:  DATE:  05/06/24: Measurement Manual decongestive techniques anterior only, reclined in power WC to include supraclavicular, deep and superficial abdominal, inguinal/axillary anastomsis and LE.  Manual completed anterior only to B LE.   Compression bandaging using 1/2" foam and multilayer short stretch bandaging from toes to knee B. Soft material liner #10, orange foam on dorsal aspect  Eval:  Education details: What lymphedema is four aspects to treatment. exercises Person educated: Patient Education method: Explanation,  Demonstration, Verbal cues, and Handouts Education comprehension: verbalized understanding  HOME EXERCISE PROGRAM:Sitting:  Ankle pumps, LAQ, hip ab/adduction, marching and diaphragm breathing. (Note pt is only able to complete less than a 1/4 of ROM due to spastic CP, however, therapist encouraged pt to complete 2x /day as this will still increase lymphatic circulation, maybe not as much as if she could complete full ROM but something is better than nothing.)  ASSESSMENT:  CLINICAL IMPRESSION:  Measurement with mixed results.  Therapist changed to 12 cm thicker netting to assist in decreasing pt itching.  Therapist notes  B LE with Rt greater than Lt has redness indicating irritation.  Therapist state that if pt is continuing to be itching bring anti itch lotion which we can don prior to compression bandaing.  Manual decongestive techniuqes complete anterior aspect with main focus on dorsal aspect of feet Rt>Lt with ~100 strokes.  Applied heavy layer of lotion prior application of multilayer short stretch bandages with orange foam over feet.  OBJECTIVE IMPAIRMENTS: decreased ROM, increased edema, and pain.   ACTIVITY LIMITATIONS: dressing and hygiene/grooming  PERSONAL FACTORS: Fitness and 1 comorbidity: spastic CP are also affecting patient's functional outcome.   REHAB POTENTIAL: Good  CLINICAL DECISION MAKING: Evolving/moderate complexity  EVALUATION COMPLEXITY: Moderate  GOALS: Goals reviewed with patient? No  SHORT TERM GOALS: Target date: 04/27/24  PT to be I with HEP Baseline: Goal status: met  2.  PT to have lost 2 cm in foot and LE to reduce risk of cellulitis  Baseline:  Goal status: met   LONG TERM GOALS: Target date: 05/18/24  PT pain to decrease to no greater than a 2 Baseline:  Goal status: met  2.  PT to have lost 4cm  foot measurement to be able to fit into a shoe. Baseline:  Goal status: on-going  3.  PT leg measurements to be decreased 3-4 cm to allow  improved skin integrity. (Currently very dry and slighty red).  Baseline:  Goal status: on-going   PLAN:  PT FREQUENCY: 2x/week  PT DURATION: 6 weeks  PLANNED INTERVENTIONS: 97110-Therapeutic exercises, 97535- Self Care, and 16109- Manual therapy  PLAN FOR NEXT SESSION: total decongestive techinques, wrap to Bil knees, measure on Fridays.    Leodis Rainwater, PT CLT [phone]   05/13/2024, 12:18 PM

## 2024-05-16 ENCOUNTER — Ambulatory Visit (HOSPITAL_COMMUNITY): Payer: MEDICAID

## 2024-05-16 ENCOUNTER — Encounter (HOSPITAL_COMMUNITY): Payer: Self-pay

## 2024-05-16 DIAGNOSIS — I890 Lymphedema, not elsewhere classified: Secondary | ICD-10-CM

## 2024-05-16 NOTE — Therapy (Signed)
 OUTPATIENT PHYSICAL THERAPY LYMPHEDEMA TREATMENT/prog  Patient Name: Christine Gomez MRN: [id number] DOB:30-May-1986, 38 y.o., female Today's Date: 05/16/2024    END OF SESSION:  PT End of Session - 05/16/24 1004     Visit Number 12    Number of Visits 18    Date for PT Re-Evaluation 05/18/24    Authorization Type Marelyn Shan put in    Authorization Time Period complete approved 18 visits from 04/06/2024-05/18/2024    Authorization - Visit Number 12    Authorization - Number of Visits 18    Progress Note Due on Visit 18    PT Start Time 0911    PT Stop Time 1004    PT Time Calculation (min) 53 min    Activity Tolerance Patient tolerated treatment well    Behavior During Therapy [facility] for tasks assessed/performed                 Past Medical History:  Diagnosis Date   Acid reflux    Cerebral palsy (HCC)    Past Surgical History:  Procedure Laterality Date   dorsal rhizotomy  1996   back ligament looosening surgery   ESOPHAGOGASTRODUODENOSCOPY (EGD) WITH PROPOFOL  N/A 10/08/2016   Procedure: ESOPHAGOGASTRODUODENOSCOPY (EGD) WITH PROPOFOL ;  Surgeon: Evangeline Hilts, MD;  Location: [facility];  Service: Endoscopy;  Laterality: N/A;   left arm surgery  03/2021   Patient Active Problem List   Diagnosis Date Noted   Dysphagia 05/31/2021   Epigastric pain 05/31/2021   Gastroesophageal reflux disease 05/31/2021   Hematemesis 05/31/2021   Hiatal hernia 05/31/2021   Microcytic anemia 05/31/2021   Sagittal band rupture, extensor tendon, nontraumatic, left 05/01/2021   Cerebral palsy (HCC) 04/25/2020   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   Other acquired deformity of other parts of limb 04/25/2020   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   Deformity of forearm, excluding fingers 04/25/2020   Urinary incontinence 04/25/2020   Iron deficiency anemia due to chronic blood loss 07/10/2017    PCP:  Rhett Cella   REFERRING PROVIDER: Armand Lamy, DO  REFERRING DIAG: I89.0 (ICD-10-CM) - Lymphedema, not elsewhere classified  THERAPY DIAG:  I89.0 (ICD-10-CM) - Lymphedema, not elsewhere classified  Rationale for Evaluation and Treatment: Rehabilitation  ONSET DATE: 06/28/24  SUBJECTIVE:                                                                                                                                                                                           SUBJECTIVE STATEMENT:  Bandages removed this morning.  Feels good today outside of the itching  following 2-3 days wearing the dressings.      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  Pt received 19 treatments of PT in [place] last year attempting to improve her mobility  PAIN:  Are you having pain? Only with pressure, 4/10   PRECAUTIONS: Other: cellulitis  RED FLAGS: None   WEIGHT BEARING RESTRICTIONS: No; pt needs assistance with transfers uses a wheelchair for locomotion.     FALLS:  Has patient fallen in last 6 months? No  LIVING ENVIRONMENT: Lives with: roomates who do not assist in her care.  She has an aide for 3 hours in the mornings and voc rehab for 4 hours in the evenings.  House/apartment Stairs: No Has following equipment at home: Wheelchair (power)   PRIOR LEVEL OF FUNCTION: Requires assistive device for independence  PATIENT GOALS: To be able to wear shoes again   OBJECTIVE: Note: Objective measures were completed at Evaluation unless otherwise noted.  COGNITION: Overall cognitive status: Within functional limits for tasks assessed   PALPATION: Noted induration of LE with noted B dorsal hump on feet.    LYMPHEDEMA ASSESSMENTS:     LE LANDMARK RIGHT eval 04/15/24 04/22/24 04/29/24 05/06/24 05/13/24  At groin        30 cm proximal to suprapatella        20 cm proximal to suprapatella        10 cm proximal to suprapatella        At midpatella  / popliteal crease        30 cm proximal to floor at lateral plantar foot 40.2 39 38 37 36.3 36.7  20 cm proximal to floor at lateral plantar foot 34.2 33 31.8 31 31.2 30.5  10 cm proximal to floor at lateral plantar foot 27 26.5 25.3 25.3 24.4 25  Circumference of ankle/heel 33 31.5 30.5 29.7 31.5 31  5  cm proximal to 1st MTP joint 27 25.6 23.5 23.4 24 24.2  Across MTP joint 23.9 24.2 22.5 21.5 23 20.8  Around proximal great toe 8.5 8.1 8.1 8.1 8 8.3  (Blank rows = not tested)  LE LANDMARK LEFT eval 04/15/24 04/22/24 04/29/24 05/06/24 05/13/24  At groin        30 cm proximal to suprapatella        20 cm proximal to suprapatella        10 cm proximal to suprapatella        At midpatella / popliteal crease        30 cm proximal to floor at lateral plantar foot 42.1 39.7 39 38.2 40 39  20 cm proximal to floor at lateral plantar foot 34.7 35 31.5 31.5 32.5 31.8  10 cm proximal to floor at lateral plantar foot 26.6 25.7 24.3 23.5 24.5 25  Circumference of ankle/heel 32.2 31.6 29 29  30.5 30.1  5 cm proximal to 1st MTP joint 25.2 24.4 23.1 22.4 23.5 24  Across MTP joint 22.3 22 21.7 22.3 22.4 22.3  Around proximal great toe 8.8 8.1 8.1 8 8.2   (Blank rows = not tested)    TODAY'S TREATMENT:  DATE:  05/06/24: Measurement Manual decongestive techniques anterior only, reclined in power WC to include supraclavicular, deep and superficial abdominal, inguinal/axillary anastomsis and LE.  Manual completed anterior only to B LE.   Compression bandaging using 1/2" foam and multilayer short stretch bandaging from toes to knee B. Soft material liner #10, orange foam on dorsal aspect  Eval:  Education details: What lymphedema is four aspects to treatment. exercises Person educated: Patient Education method: Explanation, Demonstration, Verbal cues, and Handouts Education  comprehension: verbalized understanding  HOME EXERCISE PROGRAM:Sitting:  Ankle pumps, LAQ, hip ab/adduction, marching and diaphragm breathing. (Note pt is only able to complete less than a 1/4 of ROM due to spastic CP, however, therapist encouraged pt to complete 2x /day as this will still increase lymphatic circulation, maybe not as much as if she could complete full ROM but something is better than nothing.)  ASSESSMENT:  CLINICAL IMPRESSION:   Manual decongestive techniques complete anterior aspect with focus on dorsal aspect of feet Rt>Lt with ~100 strokes.  Improved skin integrity noted, pt did not bring anti itch lotion this session.  Applied heavy layer of lotion prior application of multilayer short stretch bandage with 1/2in foam on legs and the orange foam on dorsal aspect.  Reports of comfort at EOS.  OBJECTIVE IMPAIRMENTS: decreased ROM, increased edema, and pain.   ACTIVITY LIMITATIONS: dressing and hygiene/grooming  PERSONAL FACTORS: Fitness and 1 comorbidity: spastic CP are also affecting patient's functional outcome.   REHAB POTENTIAL: Good  CLINICAL DECISION MAKING: Evolving/moderate complexity  EVALUATION COMPLEXITY: Moderate  GOALS: Goals reviewed with patient? No  SHORT TERM GOALS: Target date: 04/27/24  PT to be I with HEP Baseline: Goal status: met  2.  PT to have lost 2 cm in foot and LE to reduce risk of cellulitis  Baseline:  Goal status: met   LONG TERM GOALS: Target date: 05/18/24  PT pain to decrease to no greater than a 2 Baseline:  Goal status: met  2.  PT to have lost 4cm  foot measurement to be able to fit into a shoe. Baseline:  Goal status: on-going  3.  PT leg measurements to be decreased 3-4 cm to allow improved skin integrity. (Currently very dry and slighty red).  Baseline:  Goal status: on-going   PLAN:  PT FREQUENCY: 2x/week  PT DURATION: 6 weeks  PLANNED INTERVENTIONS: 97110-Therapeutic exercises, 97535- Self Care, and  16109- Manual therapy  PLAN FOR NEXT SESSION: total decongestive techinques, wrap to Bil knees, measure on Fridays.    Minor Amble, LPTA/CLT; Johnye Napoleon [phone]   05/16/2024, 10:06 AM

## 2024-05-18 ENCOUNTER — Encounter (HOSPITAL_COMMUNITY): Payer: MEDICAID | Admitting: Physical Therapy

## 2024-05-20 ENCOUNTER — Ambulatory Visit (HOSPITAL_COMMUNITY): Payer: MEDICAID | Admitting: Physical Therapy

## 2024-05-20 DIAGNOSIS — I890 Lymphedema, not elsewhere classified: Secondary | ICD-10-CM

## 2024-05-20 NOTE — Therapy (Signed)
 OUTPATIENT PHYSICAL THERAPY LYMPHEDEMA TREATMENT/prog  Patient Name: Christine Gomez MRN: [id number] DOB:Jun 08, 1986, 38 y.o., female Today's Date: 05/20/2024    END OF SESSION:  PT End of Session - 05/20/24 1001     Visit Number 13    Number of Visits 18    Date for PT Re-Evaluation 06/01/24    Authorization Type Marelyn Shan put in    Authorization Time Period complete approved 18 visits from 04/06/2024-05/18/2024    Authorization - Visit Number 13    Authorization - Number of Visits 18    Progress Note Due on Visit 18    PT Start Time 0901    PT Stop Time 1003    PT Time Calculation (min) 62 min    Activity Tolerance Patient tolerated treatment well    Behavior During Therapy [facility] for tasks assessed/performed                  Past Medical History:  Diagnosis Date   Acid reflux    Cerebral palsy (HCC)    Past Surgical History:  Procedure Laterality Date   dorsal rhizotomy  1996   back ligament looosening surgery   ESOPHAGOGASTRODUODENOSCOPY (EGD) WITH PROPOFOL  N/A 10/08/2016   Procedure: ESOPHAGOGASTRODUODENOSCOPY (EGD) WITH PROPOFOL ;  Surgeon: Evangeline Hilts, MD;  Location: [facility];  Service: Endoscopy;  Laterality: N/A;   left arm surgery  03/2021   Patient Active Problem List   Diagnosis Date Noted   Dysphagia 05/31/2021   Epigastric pain 05/31/2021   Gastroesophageal reflux disease 05/31/2021   Hematemesis 05/31/2021   Hiatal hernia 05/31/2021   Microcytic anemia 05/31/2021   Sagittal band rupture, extensor tendon, nontraumatic, left 05/01/2021   Cerebral palsy (HCC) 04/25/2020   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   Other acquired deformity of other parts of limb 04/25/2020   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   Deformity of forearm, excluding fingers 04/25/2020   Urinary incontinence 04/25/2020   Iron deficiency anemia due to chronic blood loss 07/10/2017     PCP: Rhett Cella   REFERRING PROVIDER: Armand Lamy, DO  REFERRING DIAG: I89.0 (ICD-10-CM) - Lymphedema, not elsewhere classified  THERAPY DIAG:  I89.0 (ICD-10-CM) - Lymphedema, not elsewhere classified  Rationale for Evaluation and Treatment: Rehabilitation  ONSET DATE: 06/28/24  SUBJECTIVE:                                                                                                                                                                                           SUBJECTIVE STATEMENT:  Pt states that she has less pain in her feet  PERTINENT HISTORY: 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  Pt received 19 treatments of PT in [place] last year attempting to improve her mobility  PAIN:  Are you having pain? Only with pressure, 3/10   PRECAUTIONS: Other: cellulitis  RED FLAGS: None   WEIGHT BEARING RESTRICTIONS: No; pt needs assistance with transfers uses a wheelchair for locomotion.     FALLS:  Has patient fallen in last 6 months? No  LIVING ENVIRONMENT: Lives with: roomates who do not assist in her care.  She has an aide for 3 hours in the mornings and voc rehab for 4 hours in the evenings.  House/apartment Stairs: No Has following equipment at home: Wheelchair (power)   PRIOR LEVEL OF FUNCTION: Requires assistive device for independence  PATIENT GOALS: To be able to wear shoes again   OBJECTIVE: Note: Objective measures were completed at Evaluation unless otherwise noted.  COGNITION: Overall cognitive status: Within functional limits for tasks assessed   PALPATION: Noted induration of LE with noted B dorsal hump on feet.    LYMPHEDEMA ASSESSMENTS:     LE LANDMARK RIGHT eval 04/15/24 04/22/24 04/29/24 05/06/24 05/13/24 05/20/24  At groin         30 cm proximal to suprapatella         20 cm proximal to suprapatella         10 cm proximal to suprapatella         At midpatella / popliteal crease         30 cm proximal to  floor at lateral plantar foot 40.2 39 38 37 36.3 36.7 37.5  20 cm proximal to floor at lateral plantar foot 34.2 33 31.8 31 31.2 30.5 30  10  cm proximal to floor at lateral plantar foot 27 26.5 25.3 25.3 24.4 25 24   Circumference of ankle/heel 33 31.5 30.5 29.7 31.5 31 30.8  5 cm proximal to 1st MTP joint 27 25.6 23.5 23.4 24 24.2 24  Across MTP joint 23.9 24.2 22.5 21.5 23 20.8 21.8  Around proximal great toe 8.5 8.1 8.1 8.1 8 8.3 8.5  (Blank rows = not tested)  LE LANDMARK LEFT eval 04/15/24 04/22/24 04/29/24 05/06/24 05/13/24 05/20/24  At groin         30 cm proximal to suprapatella         20 cm proximal to suprapatella         10 cm proximal to suprapatella         At midpatella / popliteal crease         30 cm proximal to floor at lateral plantar foot 42.1 39.7 39 38.2 40 39 37.5  20 cm proximal to floor at lateral plantar foot 34.7 35 31.5 31.5 32.5 31.8 30.7  10 cm proximal to floor at lateral plantar foot 26.6 25.7 24.3 23.5 24.5 25 24.3  Circumference of ankle/heel 32.2 31.6 29 29  30.5 30.1 29.7  5 cm proximal to 1st MTP joint 25.2 24.4 23.1 22.4 23.5 24 23.3  Across MTP joint 22.3 22 21.7 22.3 22.4 22.3 23  Around proximal great toe 8.8 8.1 8.1 8 8.2  8.4  (Blank rows = not tested)    TODAY'S TREATMENT:  DATE:  05/20/24: Measurement Manual decongestive techniques anterior only, reclined in power WC to include supraclavicular, deep and superficial abdominal, inguinal/axillary anastomsis and LE.  Manual completed anterior only to B LE.   Compression bandaging using 1/2" foam and multilayer short stretch bandaging from toes to knee B. Soft material liner #10, orange foam on dorsal aspect  Eval:  Education details: What lymphedema is four aspects to treatment. exercises Person educated: Patient Education method: Explanation, Demonstration, Verbal cues, and  Handouts Education comprehension: verbalized understanding  HOME EXERCISE PROGRAM:Sitting:  Ankle pumps, LAQ, hip ab/adduction, marching and diaphragm breathing. (Note pt is only able to complete less than a 1/4 of ROM due to spastic CP, however, therapist encouraged pt to complete 2x /day as this will still increase lymphatic circulation, maybe not as much as if she could complete full ROM but something is better than nothing.)  ASSESSMENT:  CLINICAL IMPRESSION:  Therapist called [facility] re referral for foot and LE juxtafit with no answer.  Left message to call pt.   Manual decongestive techniques complete anterior aspect with focus on dorsal aspect of feet Rt>Lt with ~100 strokes.   Applied heavy layer of lotion prior application of multilayer short stretch bandage with 1/2in foam on legs and the orange foam on dorsal aspect.  Reports of comfort at EOS.  OBJECTIVE IMPAIRMENTS: decreased ROM, increased edema, and pain.   ACTIVITY LIMITATIONS: dressing and hygiene/grooming  PERSONAL FACTORS: Fitness and 1 comorbidity: spastic CP are also affecting patient's functional outcome.   REHAB POTENTIAL: Good  CLINICAL DECISION MAKING: Evolving/moderate complexity  EVALUATION COMPLEXITY: Moderate  GOALS: Goals reviewed with patient? No  SHORT TERM GOALS: Target date: 04/27/24  PT to be I with HEP Baseline: Goal status: met  2.  PT to have lost 2 cm in foot and LE to reduce risk of cellulitis  Baseline:  Goal status: met   LONG TERM GOALS: Target date: 05/18/24  PT pain to decrease to no greater than a 2 Baseline:  Goal status: met  2.  PT to have lost 4cm  foot measurement to be able to fit into a shoe. Baseline:  Goal status: on-going  3.  PT leg measurements to be decreased 3-4 cm to allow improved skin integrity. (Currently very dry and slighty red).  Baseline:  Goal status: on-going   PLAN:  PT FREQUENCY: 2x/week  PT DURATION: 6 weeks  PLANNED  INTERVENTIONS: 97110-Therapeutic exercises, 97535- Self Care, and 54098- Manual therapy  PLAN FOR NEXT SESSION: total decongestive techinques, wrap to Bil knees, measure on Fridays.    Leodis Rainwater, PT CLT [phone]  [phone]   05/20/2024, 10:03 AM

## 2024-05-24 NOTE — Addendum Note (Signed)
 Addended by: Adrienne Alberts on: 05/24/2024 08:53 AM   Modules accepted: Orders

## 2024-05-25 ENCOUNTER — Ambulatory Visit (HOSPITAL_COMMUNITY): Payer: MEDICAID | Admitting: Physical Therapy

## 2024-05-25 ENCOUNTER — Encounter (HOSPITAL_COMMUNITY): Payer: MEDICAID | Admitting: Physical Therapy

## 2024-05-25 DIAGNOSIS — I890 Lymphedema, not elsewhere classified: Secondary | ICD-10-CM

## 2024-05-25 DIAGNOSIS — M6281 Muscle weakness (generalized): Secondary | ICD-10-CM

## 2024-05-25 NOTE — Therapy (Signed)
 OUTPATIENT PHYSICAL THERAPY LYMPHEDEMA TREATMENT  Patient Name: Christine Gomez MRN: [id number] DOB:07-Aug-1986, 38 y.o., female Today's Date: 05/25/2024    END OF SESSION:  PT End of Session - 05/25/24 1149     Visit Number 14    Number of Visits 18    Date for PT Re-Evaluation 06/01/24    Authorization Type Marelyn Shan put in    Authorization Time Period complete approved 18 visits from 04/06/2024-05/18/2024    Authorization - Visit Number 14    Authorization - Number of Visits 18    Progress Note Due on Visit 18    PT Start Time 1001    PT Stop Time 1058    PT Time Calculation (min) 57 min    Activity Tolerance Patient tolerated treatment well    Behavior During Therapy [facility] for tasks assessed/performed                  Past Medical History:  Diagnosis Date   Acid reflux    Cerebral palsy (HCC)    Past Surgical History:  Procedure Laterality Date   dorsal rhizotomy  1996   back ligament looosening surgery   ESOPHAGOGASTRODUODENOSCOPY (EGD) WITH PROPOFOL  N/A 10/08/2016   Procedure: ESOPHAGOGASTRODUODENOSCOPY (EGD) WITH PROPOFOL ;  Surgeon: Evangeline Hilts, MD;  Location: [facility];  Service: Endoscopy;  Laterality: N/A;   left arm surgery  03/2021   Patient Active Problem List   Diagnosis Date Noted   Dysphagia 05/31/2021   Epigastric pain 05/31/2021   Gastroesophageal reflux disease 05/31/2021   Hematemesis 05/31/2021   Hiatal hernia 05/31/2021   Microcytic anemia 05/31/2021   Sagittal band rupture, extensor tendon, nontraumatic, left 05/01/2021   Cerebral palsy (HCC) 04/25/2020   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   Other acquired deformity of other parts of limb 04/25/2020   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   Deformity of forearm, excluding fingers 04/25/2020   Urinary incontinence 04/25/2020   Iron deficiency anemia due to chronic blood loss 07/10/2017    PCP:  Rhett Cella   REFERRING PROVIDER: Armand Lamy, DO  REFERRING DIAG: I89.0 (ICD-10-CM) - Lymphedema, not elsewhere classified  THERAPY DIAG:  I89.0 (ICD-10-CM) - Lymphedema, not elsewhere classified  Rationale for Evaluation and Treatment: Rehabilitation  ONSET DATE: 06/28/24  SUBJECTIVE:                                                                                                                                                                                           SUBJECTIVE STATEMENT:  Pt states that she washed the compression bandages as instructed but  due to this she has not had compression on for the past two days and can really tell that her legs are up and more sensitive.    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  Pt received 19 treatments of PT in [place] last year attempting to improve her mobility  PAIN:  Are you having pain? Only with pressure, 5/10   PRECAUTIONS: Other: cellulitis  RED FLAGS: None   WEIGHT BEARING RESTRICTIONS: No; pt needs assistance with transfers uses a wheelchair for locomotion.     FALLS:  Has patient fallen in last 6 months? No  LIVING ENVIRONMENT: Lives with: roomates who do not assist in her care.  She has an aide for 3 hours in the mornings and voc rehab for 4 hours in the evenings.  House/apartment Stairs: No Has following equipment at home: Wheelchair (power)   PRIOR LEVEL OF FUNCTION: Requires assistive device for independence  PATIENT GOALS: To be able to wear shoes again   OBJECTIVE: Note: Objective measures were completed at Evaluation unless otherwise noted.  COGNITION: Overall cognitive status: Within functional limits for tasks assessed   PALPATION: Noted induration of LE with noted B dorsal hump on feet.    LYMPHEDEMA ASSESSMENTS:     LE LANDMARK RIGHT eval 04/15/24 04/22/24 04/29/24 05/06/24 05/13/24 05/20/24  At groin         30 cm proximal to suprapatella         20  cm proximal to suprapatella         10 cm proximal to suprapatella         At midpatella / popliteal crease         30 cm proximal to floor at lateral plantar foot 40.2 39 38 37 36.3 36.7 37.5  20 cm proximal to floor at lateral plantar foot 34.2 33 31.8 31 31.2 30.5 30  10  cm proximal to floor at lateral plantar foot 27 26.5 25.3 25.3 24.4 25 24   Circumference of ankle/heel 33 31.5 30.5 29.7 31.5 31 30.8  5 cm proximal to 1st MTP joint 27 25.6 23.5 23.4 24 24.2 24  Across MTP joint 23.9 24.2 22.5 21.5 23 20.8 21.8  Around proximal great toe 8.5 8.1 8.1 8.1 8 8.3 8.5  (Blank rows = not tested)  LE LANDMARK LEFT eval 04/15/24 04/22/24 04/29/24 05/06/24 05/13/24 05/20/24  At groin         30 cm proximal to suprapatella         20 cm proximal to suprapatella         10 cm proximal to suprapatella         At midpatella / popliteal crease         30 cm proximal to floor at lateral plantar foot 42.1 39.7 39 38.2 40 39 37.5  20 cm proximal to floor at lateral plantar foot 34.7 35 31.5 31.5 32.5 31.8 30.7  10 cm proximal to floor at lateral plantar foot 26.6 25.7 24.3 23.5 24.5 25 24.3  Circumference of ankle/heel 32.2 31.6 29 29  30.5 30.1 29.7  5 cm proximal to 1st MTP joint 25.2 24.4 23.1 22.4 23.5 24 23.3  Across MTP joint 22.3 22 21.7 22.3 22.4 22.3 23  Around proximal great toe 8.8 8.1 8.1 8 8.2  8.4  (Blank rows = not tested)    TODAY'S TREATMENT:  DATE:  05/25/24:  Manual decongestive techniques anterior only, reclined in power WC to include supraclavicular, deep and superficial abdominal, inguinal/axillary anastomsis and LE.  Manual completed anterior only to B LE.   Compression bandaging using 1/2" foam and multilayer short stretch bandaging from toes to knee B. Soft material liner #10, orange foam on dorsal aspect  Eval:  Education details: What lymphedema is four  aspects to treatment. exercises Person educated: Patient Education method: Explanation, Demonstration, Verbal cues, and Handouts Education comprehension: verbalized understanding  HOME EXERCISE PROGRAM:Sitting:  Ankle pumps, LAQ, hip ab/adduction, marching and diaphragm breathing. (Note pt is only able to complete less than a 1/4 of ROM due to spastic CP, however, therapist encouraged pt to complete 2x /day as this will still increase lymphatic circulation, maybe not as much as if she could complete full ROM but something is better than nothing.)  ASSESSMENT:  CLINICAL IMPRESSION:  [facility] called pt MD has left the group that they had the number for, once they can find MD and get signature she should have her juxtafit.  Therapist gave pt number for both [facility] for the juxtafit and [facility] for the compression to verify MD number.  PT LE with noted increased induration as well as increased redness therefore therapist spent extra time on manual.  PT will continue to benefit from skilled PT to reduce edema and insure I in maintenance of compression pump and juxtafit.   OBJECTIVE IMPAIRMENTS: decreased ROM, increased edema, and pain.   ACTIVITY LIMITATIONS: dressing and hygiene/grooming  PERSONAL FACTORS: Fitness and 1 comorbidity: spastic CP are also affecting patient's functional outcome.   REHAB POTENTIAL: Good  CLINICAL DECISION MAKING: Evolving/moderate complexity  EVALUATION COMPLEXITY: Moderate  GOALS: Goals reviewed with patient? No  SHORT TERM GOALS: Target date: 04/27/24  PT to be I with HEP Baseline: Goal status: met  2.  PT to have lost 2 cm in foot and LE to reduce risk of cellulitis  Baseline:  Goal status: met   LONG TERM GOALS: Target date: 05/18/24  PT pain to decrease to no greater than a 2 Baseline:  Goal status: met  2.  PT to have lost 4cm  foot measurement to be able to fit into a shoe. Baseline:  Goal status: on-going  3.  PT leg measurements to be  decreased 3-4 cm to allow improved skin integrity. (Currently very dry and slighty red).  Baseline:  Goal status: on-going   PLAN:  PT FREQUENCY: 2x/week  PT DURATION: 6 weeks  PLANNED INTERVENTIONS: 97110-Therapeutic exercises, 97535- Self Care, and 16109- Manual therapy  PLAN FOR NEXT SESSION: total decongestive techinques, wrap to Bil knees, measure on Fridays.    Leodis Rainwater, PT CLT [phone]     05/25/2024, 11:51 AM

## 2024-05-27 ENCOUNTER — Ambulatory Visit (HOSPITAL_COMMUNITY): Payer: MEDICAID | Admitting: Physical Therapy

## 2024-05-27 DIAGNOSIS — I890 Lymphedema, not elsewhere classified: Secondary | ICD-10-CM

## 2024-05-27 DIAGNOSIS — G800 Spastic quadriplegic cerebral palsy: Secondary | ICD-10-CM

## 2024-05-27 NOTE — Therapy (Signed)
 OUTPATIENT PHYSICAL THERAPY LYMPHEDEMA TREATMENT  Patient Name: Christine Gomez MRN: [id number] DOB:08-07-1986, 38 y.o., female Today's Date: 05/27/2024    END OF SESSION:  PT End of Session - 05/27/24 0856     Visit Number 15    Number of Visits 18    Date for PT Re-Evaluation 06/01/24    Authorization Type Marelyn Shan put in    Authorization Time Period complete approved 18 visits from 04/06/2024-05/18/2024    Authorization - Visit Number 15    Authorization - Number of Visits 18    Progress Note Due on Visit 18    PT Start Time 0900    PT Stop Time 1004    PT Time Calculation (min) 64 min    Activity Tolerance Patient tolerated treatment well    Behavior During Therapy [facility] for tasks assessed/performed                  Past Medical History:  Diagnosis Date   Acid reflux    Cerebral palsy (HCC)    Past Surgical History:  Procedure Laterality Date   dorsal rhizotomy  1996   back ligament looosening surgery   ESOPHAGOGASTRODUODENOSCOPY (EGD) WITH PROPOFOL  N/A 10/08/2016   Procedure: ESOPHAGOGASTRODUODENOSCOPY (EGD) WITH PROPOFOL ;  Surgeon: Evangeline Hilts, MD;  Location: [facility];  Service: Endoscopy;  Laterality: N/A;   left arm surgery  03/2021   Patient Active Problem List   Diagnosis Date Noted   Dysphagia 05/31/2021   Epigastric pain 05/31/2021   Gastroesophageal reflux disease 05/31/2021   Hematemesis 05/31/2021   Hiatal hernia 05/31/2021   Microcytic anemia 05/31/2021   Sagittal band rupture, extensor tendon, nontraumatic, left 05/01/2021   Cerebral palsy (HCC) 04/25/2020   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   Other acquired deformity of other parts of limb 04/25/2020   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   Deformity of forearm, excluding fingers 04/25/2020   Urinary incontinence 04/25/2020   Iron deficiency anemia due to chronic blood loss 07/10/2017    PCP:  Rhett Cella   REFERRING PROVIDER: Armand Lamy, DO  REFERRING DIAG: I89.0 (ICD-10-CM) - Lymphedema, not elsewhere classified  THERAPY DIAG:  I89.0 (ICD-10-CM) - Lymphedema, not elsewhere classified  Rationale for Evaluation and Treatment: Rehabilitation  ONSET DATE: 06/28/24  SUBJECTIVE:                                                                                                                                                                                           SUBJECTIVE STATEMENT:  Pt states that she called both the compression and pump companies  both are awaiting approval from MD>   PERTINENT HISTORY: 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  Pt received 19 treatments of PT in [place] last year attempting to improve her mobility  PAIN:  Are you having pain? Only with pressure, 5/10   PRECAUTIONS: Other: cellulitis  RED FLAGS: None   WEIGHT BEARING RESTRICTIONS: No; pt needs assistance with transfers uses a wheelchair for locomotion.     FALLS:  Has patient fallen in last 6 months? No  LIVING ENVIRONMENT: Lives with: roomates who do not assist in her care.  She has an aide for 3 hours in the mornings and voc rehab for 4 hours in the evenings.  House/apartment Stairs: No Has following equipment at home: Wheelchair (power)   PRIOR LEVEL OF FUNCTION: Requires assistive device for independence  PATIENT GOALS: To be able to wear shoes again   OBJECTIVE: Note: Objective measures were completed at Evaluation unless otherwise noted.  COGNITION: Overall cognitive status: Within functional limits for tasks assessed   PALPATION: Noted induration of LE with noted B dorsal hump on feet.    LYMPHEDEMA ASSESSMENTS:     LE LANDMARK RIGHT eval 04/15/24 04/22/24 04/29/24 05/06/24 05/13/24 05/20/24 05/27/24  At groin          30 cm proximal to suprapatella          20 cm proximal to suprapatella          10 cm proximal to suprapatella          At  midpatella / popliteal crease          30 cm proximal to floor at lateral plantar foot 40.2 39 38 37 36.3 36.7 37.5 38.5  20 cm proximal to floor at lateral plantar foot 34.2 33 31.8 31 31.2 30.5 30 32  10 cm proximal to floor at lateral plantar foot 27 26.5 25.3 25.3 24.4 25 24 25   Circumference of ankle/heel 33 31.5 30.5 29.7 31.5 31 30.8 31  5  cm proximal to 1st MTP joint 27 25.6 23.5 23.4 24 24.2 24 23.8  Across MTP joint 23.9 24.2 22.5 21.5 23 20.8 21.8 21.8  Around proximal great toe 8.5 8.1 8.1 8.1 8 8.3 8.5 8.6  (Blank rows = not tested)  LE LANDMARK LEFT eval 04/15/24 04/22/24 04/29/24 05/06/24 05/13/24 05/20/24 05/27/24  At groin          30 cm proximal to suprapatella          20 cm proximal to suprapatella          10 cm proximal to suprapatella          At midpatella / popliteal crease          30 cm proximal to floor at lateral plantar foot 42.1 39.7 39 38.2 40 39 37.5 39.3  20 cm proximal to floor at lateral plantar foot 34.7 35 31.5 31.5 32.5 31.8 30.7 32.3  10 cm proximal to floor at lateral plantar foot 26.6 25.7 24.3 23.5 24.5 25 24.3 25  Circumference of ankle/heel 32.2 31.6 29 29  30.5 30.1 29.7 31  5  cm proximal to 1st MTP joint 25.2 24.4 23.1 22.4 23.5 24 23.3 23  Across MTP joint 22.3 22 21.7 22.3 22.4 22.3 23 22.5  Around proximal great toe 8.8 8.1 8.1 8 8.2  8.4 8.4  (Blank rows = not tested)    TODAY'S TREATMENT:  DATE:  05/27/24: measurement:  Manual decongestive techniques anterior only, reclined in power WC to include supraclavicular, deep and superficial abdominal, inguinal/axillary anastomsis and LE.  Manual completed anterior only to B LE.   Compression bandaging using 1/2" foam and multilayer short stretch bandaging from toes to knee B. Soft material liner #10, orange foam on dorsal aspect  Eval:  Education details: What lymphedema is  four aspects to treatment. exercises Person educated: Patient Education method: Explanation, Demonstration, Verbal cues, and Handouts Education comprehension: verbalized understanding  HOME EXERCISE PROGRAM:Sitting:  Ankle pumps, LAQ, hip ab/adduction, marching and diaphragm breathing. (Note pt is only able to complete less than a 1/4 of ROM due to spastic CP, however, therapist encouraged pt to complete 2x /day as this will still increase lymphatic circulation, maybe not as much as if she could complete full ROM but something is better than nothing.)  ASSESSMENT:  CLINICAL IMPRESSION:    PT LE continues with increased edema but significantly improved from Wed.  Redness is now gone.  PT will continue to benefit from skilled PT to reduce edema and insure I in maintenance of compression pump and juxtafit.   OBJECTIVE IMPAIRMENTS: decreased ROM, increased edema, and pain.   ACTIVITY LIMITATIONS: dressing and hygiene/grooming  PERSONAL FACTORS: Fitness and 1 comorbidity: spastic CP are also affecting patient's functional outcome.   REHAB POTENTIAL: Good  CLINICAL DECISION MAKING: Evolving/moderate complexity  EVALUATION COMPLEXITY: Moderate  GOALS: Goals reviewed with patient? No  SHORT TERM GOALS: Target date: 04/27/24  PT to be I with HEP Baseline: Goal status: met  2.  PT to have lost 2 cm in foot and LE to reduce risk of cellulitis  Baseline:  Goal status: met   LONG TERM GOALS: Target date: 05/18/24  PT pain to decrease to no greater than a 2 Baseline:  Goal status: met  2.  PT to have lost 4cm  foot measurement to be able to fit into a shoe. Baseline:  Goal status: on-going  3.  PT leg measurements to be decreased 3-4 cm to allow improved skin integrity. (Currently very dry and slighty red).  Baseline:  Goal status: on-going   PLAN:  PT FREQUENCY: 2x/week  PT DURATION: 6 weeks  PLANNED INTERVENTIONS: 97110-Therapeutic exercises, 97535- Self Care, and 78469-  Manual therapy  PLAN FOR NEXT SESSION: total decongestive techinques, wrap to Bil knees, measure on Fridays.    Leodis Rainwater, PT CLT [phone]     05/27/2024, 10:33 AM

## 2024-05-30 ENCOUNTER — Encounter (HOSPITAL_COMMUNITY)

## 2024-05-31 ENCOUNTER — Ambulatory Visit (HOSPITAL_COMMUNITY): Payer: MEDICAID | Attending: Family Medicine | Admitting: Physical Therapy

## 2024-05-31 DIAGNOSIS — I890 Lymphedema, not elsewhere classified: Secondary | ICD-10-CM | POA: Insufficient documentation

## 2024-05-31 DIAGNOSIS — M6281 Muscle weakness (generalized): Secondary | ICD-10-CM | POA: Diagnosis present

## 2024-05-31 DIAGNOSIS — G800 Spastic quadriplegic cerebral palsy: Secondary | ICD-10-CM | POA: Diagnosis present

## 2024-05-31 NOTE — Therapy (Signed)
 OUTPATIENT PHYSICAL THERAPY LYMPHEDEMA TREATMENT  Patient Name: Christine Gomez MRN: [id number] DOB:12/15/1986, 38 y.o., female Today's Date: 05/31/2024    END OF SESSION:  PT End of Session - 05/31/24 1156     Visit Number 16    Number of Visits 18    Date for PT Re-Evaluation 06/01/24    Authorization Type Marelyn Shan put in    Authorization Time Period complete approved 18 visits from 04/06/2024-05/18/2024    Authorization - Visit Number 16    Authorization - Number of Visits 18    Progress Note Due on Visit 18    PT Start Time 1013    PT Stop Time 1112    PT Time Calculation (min) 59 min    Activity Tolerance Patient tolerated treatment well    Behavior During Therapy [facility] for tasks assessed/performed                  Past Medical History:  Diagnosis Date   Acid reflux    Cerebral palsy (HCC)    Past Surgical History:  Procedure Laterality Date   dorsal rhizotomy  1996   back ligament looosening surgery   ESOPHAGOGASTRODUODENOSCOPY (EGD) WITH PROPOFOL  N/A 10/08/2016   Procedure: ESOPHAGOGASTRODUODENOSCOPY (EGD) WITH PROPOFOL ;  Surgeon: Evangeline Hilts, MD;  Location: [facility];  Service: Endoscopy;  Laterality: N/A;   left arm surgery  03/2021   Patient Active Problem List   Diagnosis Date Noted   Dysphagia 05/31/2021   Epigastric pain 05/31/2021   Gastroesophageal reflux disease 05/31/2021   Hematemesis 05/31/2021   Hiatal hernia 05/31/2021   Microcytic anemia 05/31/2021   Sagittal band rupture, extensor tendon, nontraumatic, left 05/01/2021   Cerebral palsy (HCC) 04/25/2020   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   Other acquired deformity of other parts of limb 04/25/2020   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   Deformity of forearm, excluding fingers 04/25/2020   Urinary incontinence 04/25/2020   Iron deficiency anemia due to chronic blood loss 07/10/2017    PCP:  Rhett Cella   REFERRING PROVIDER: Armand Lamy, DO  REFERRING DIAG: I89.0 (ICD-10-CM) - Lymphedema, not elsewhere classified  THERAPY DIAG:  I89.0 (ICD-10-CM) - Lymphedema, not elsewhere classified  Rationale for Evaluation and Treatment: Rehabilitation  ONSET DATE: 06/28/24  SUBJECTIVE:                                                                                                                                                                                           SUBJECTIVE STATEMENT:  Pt states that she has been notified that her medicaid has  been cancelled.  The medicaid was paying for her transportation and she had to pay $150.00 to get her today.  She is going to work on transportation but is unable to afford this.   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  Pt received 19 treatments of PT in [place] last year attempting to improve her mobility  PAIN:  Are you having pain? Only with pressure, 5/10   PRECAUTIONS: Other: cellulitis  RED FLAGS: None   WEIGHT BEARING RESTRICTIONS: No; pt needs assistance with transfers uses a wheelchair for locomotion.     FALLS:  Has patient fallen in last 6 months? No  LIVING ENVIRONMENT: Lives with: roomates who do not assist in her care.  She has an aide for 3 hours in the mornings and voc rehab for 4 hours in the evenings.  House/apartment Stairs: No Has following equipment at home: Wheelchair (power)   PRIOR LEVEL OF FUNCTION: Requires assistive device for independence  PATIENT GOALS: To be able to wear shoes again   OBJECTIVE: Note: Objective measures were completed at Evaluation unless otherwise noted.  COGNITION: Overall cognitive status: Within functional limits for tasks assessed   PALPATION: Noted induration of LE with noted B dorsal hump on feet.    LYMPHEDEMA ASSESSMENTS:     LE LANDMARK RIGHT eval 04/15/24 04/22/24 04/29/24 05/06/24 05/13/24 05/20/24 05/27/24  At groin           30 cm proximal to suprapatella          20 cm proximal to suprapatella          10 cm proximal to suprapatella          At midpatella / popliteal crease          30 cm proximal to floor at lateral plantar foot 40.2 39 38 37 36.3 36.7 37.5 38.5  20 cm proximal to floor at lateral plantar foot 34.2 33 31.8 31 31.2 30.5 30 32  10 cm proximal to floor at lateral plantar foot 27 26.5 25.3 25.3 24.4 25 24 25   Circumference of ankle/heel 33 31.5 30.5 29.7 31.5 31 30.8 31  5  cm proximal to 1st MTP joint 27 25.6 23.5 23.4 24 24.2 24 23.8  Across MTP joint 23.9 24.2 22.5 21.5 23 20.8 21.8 21.8  Around proximal great toe 8.5 8.1 8.1 8.1 8 8.3 8.5 8.6  (Blank rows = not tested)  LE LANDMARK LEFT eval 04/15/24 04/22/24 04/29/24 05/06/24 05/13/24 05/20/24 05/27/24  At groin          30 cm proximal to suprapatella          20 cm proximal to suprapatella          10 cm proximal to suprapatella          At midpatella / popliteal crease          30 cm proximal to floor at lateral plantar foot 42.1 39.7 39 38.2 40 39 37.5 39.3  20 cm proximal to floor at lateral plantar foot 34.7 35 31.5 31.5 32.5 31.8 30.7 32.3  10 cm proximal to floor at lateral plantar foot 26.6 25.7 24.3 23.5 24.5 25 24.3 25  Circumference of ankle/heel 32.2 31.6 29 29  30.5 30.1 29.7 31  5  cm proximal to 1st MTP joint 25.2 24.4 23.1 22.4 23.5 24 23.3 23  Across MTP joint 22.3 22 21.7 22.3 22.4 22.3 23 22.5  Around proximal great toe 8.8 8.1 8.1 8  8.2  8.4 8.4  (Blank rows = not tested)    TODAY'S TREATMENT:                                                                                                                              DATE:  05/31/24:  Manual decongestive techniques anterior only, reclined in power WC to include supraclavicular, deep and superficial abdominal, inguinal/axillary anastomsis and LE.  Manual completed anterior only to B LE.   Compression bandaging using 1/2" foam and multilayer short stretch bandaging from  toes to knee B. Soft material liner #10, orange foam on dorsal aspect  Eval:  Education details: What lymphedema is four aspects to treatment. exercises Person educated: Patient Education method: Explanation, Demonstration, Verbal cues, and Handouts Education comprehension: verbalized understanding  HOME EXERCISE PROGRAM:Sitting:  Ankle pumps, LAQ, hip ab/adduction, marching and diaphragm breathing. (Note pt is only able to complete less than a 1/4 of ROM due to spastic CP, however, therapist encouraged pt to complete 2x /day as this will still increase lymphatic circulation, maybe not as much as if she could complete full ROM but something is better than nothing.)  ASSESSMENT:  CLINICAL IMPRESSION:    PT LE continues are significantly improved from last week when she went without compression for four days and her LE became indurated.  No induration or  Redness is now present .  PT will continue to benefit from skilled PT to reduce edema and insure I in maintenance of compression pump and juxtafit.   OBJECTIVE IMPAIRMENTS: decreased ROM, increased edema, and pain.   ACTIVITY LIMITATIONS: dressing and hygiene/grooming  PERSONAL FACTORS: Fitness and 1 comorbidity: spastic CP are also affecting patient's functional outcome.   REHAB POTENTIAL: Good  CLINICAL DECISION MAKING: Evolving/moderate complexity  EVALUATION COMPLEXITY: Moderate  GOALS: Goals reviewed with patient? No  SHORT TERM GOALS: Target date: 04/27/24  PT to be I with HEP Baseline: Goal status: met  2.  PT to have lost 2 cm in foot and LE to reduce risk of cellulitis  Baseline:  Goal status: met   LONG TERM GOALS: Target date: 05/18/24  PT pain to decrease to no greater than a 2 Baseline:  Goal status: met  2.  PT to have lost 4cm  foot measurement to be able to fit into a shoe. Baseline:  Goal status: on-going  3.  PT leg measurements to be decreased 3-4 cm to allow improved skin integrity. (Currently very  dry and slighty red).  Baseline:  Goal status: on-going   PLAN:  PT FREQUENCY: 2x/week  PT DURATION: 6 weeks  PLANNED INTERVENTIONS: 97110-Therapeutic exercises, 97535- Self Care, and 65784- Manual therapy  PLAN FOR NEXT SESSION: total decongestive techinques, wrap to Bil knees, measure on Fridays.    Leodis Rainwater, PT CLT [phone]     05/31/2024, 11:57 AM

## 2024-06-02 ENCOUNTER — Encounter (HOSPITAL_COMMUNITY): Payer: Self-pay | Admitting: Physical Therapy

## 2024-06-02 ENCOUNTER — Ambulatory Visit (HOSPITAL_COMMUNITY): Payer: MEDICAID | Admitting: Physical Therapy

## 2024-06-02 DIAGNOSIS — I890 Lymphedema, not elsewhere classified: Secondary | ICD-10-CM | POA: Diagnosis not present

## 2024-06-02 DIAGNOSIS — G800 Spastic quadriplegic cerebral palsy: Secondary | ICD-10-CM

## 2024-06-02 NOTE — Therapy (Signed)
 OUTPATIENT PHYSICAL THERAPY LYMPHEDEMA TREATMENT  Patient Name: Christine Gomez MRN: [id number] DOB:December 03, 1986, 38 y.o., female Today's Date: 06/02/2024    END OF SESSION:  PT End of Session - 06/02/24 1118     Visit Number 17    Number of Visits 18    Date for PT Re-Evaluation 06/01/24    Authorization Type Marelyn Shan put in    Authorization Time Period complete approved 18 visits from 04/06/2024-05/18/2024    Authorization - Visit Number 17    Authorization - Number of Visits 18    Progress Note Due on Visit 18    Activity Tolerance Patient tolerated treatment well    Behavior During Therapy [facility] for tasks assessed/performed                  Past Medical History:  Diagnosis Date   Acid reflux    Cerebral palsy (HCC)    Past Surgical History:  Procedure Laterality Date   dorsal rhizotomy  1996   back ligament looosening surgery   ESOPHAGOGASTRODUODENOSCOPY (EGD) WITH PROPOFOL  N/A 10/08/2016   Procedure: ESOPHAGOGASTRODUODENOSCOPY (EGD) WITH PROPOFOL ;  Surgeon: Evangeline Hilts, MD;  Location: [facility];  Service: Endoscopy;  Laterality: N/A;   left arm surgery  03/2021   Patient Active Problem List   Diagnosis Date Noted   Dysphagia 05/31/2021   Epigastric pain 05/31/2021   Gastroesophageal reflux disease 05/31/2021   Hematemesis 05/31/2021   Hiatal hernia 05/31/2021   Microcytic anemia 05/31/2021   Sagittal band rupture, extensor tendon, nontraumatic, left 05/01/2021   Cerebral palsy (HCC) 04/25/2020   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   Other acquired deformity of other parts of limb 04/25/2020   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   Deformity of forearm, excluding fingers 04/25/2020   Urinary incontinence 04/25/2020   Iron deficiency anemia due to chronic blood loss 07/10/2017    PCP: Rhett Cella   REFERRING PROVIDER: Armand Lamy, DO  REFERRING DIAG: I89.0  (ICD-10-CM) - Lymphedema, not elsewhere classified  THERAPY DIAG:  I89.0 (ICD-10-CM) - Lymphedema, not elsewhere classified  Rationale for Evaluation and Treatment: Rehabilitation  ONSET DATE: 06/28/24  SUBJECTIVE:                                                                                                                                                                                           SUBJECTIVE STATEMENT:  PT has not heard about her pump or her juxtafit at this time.    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  Pt received 19 treatments of  PT in [place] last year attempting to improve her mobility  PAIN:  Are you having pain? Only with pressure, 5/10   PRECAUTIONS: Other: cellulitis  RED FLAGS: None   WEIGHT BEARING RESTRICTIONS: No; pt needs assistance with transfers uses a wheelchair for locomotion.     FALLS:  Has patient fallen in last 6 months? No  LIVING ENVIRONMENT: Lives with: roomates who do not assist in her care.  She has an aide for 3 hours in the mornings and voc rehab for 4 hours in the evenings.  House/apartment Stairs: No Has following equipment at home: Wheelchair (power)   PRIOR LEVEL OF FUNCTION: Requires assistive device for independence  PATIENT GOALS: To be able to wear shoes again   OBJECTIVE: Note: Objective measures were completed at Evaluation unless otherwise noted.  COGNITION: Overall cognitive status: Within functional limits for tasks assessed   PALPATION: Noted induration of LE with noted B dorsal hump on feet.    LYMPHEDEMA ASSESSMENTS:     LE LANDMARK RIGHT eval 04/15/24 04/22/24 04/29/24 05/06/24 05/13/24 05/20/24 05/27/24 06/02/24  At groin           30 cm proximal to suprapatella           20 cm proximal to suprapatella           10 cm proximal to suprapatella           At midpatella / popliteal crease           30 cm proximal to floor at lateral plantar foot 40.2 39 38 37 36.3 36.7  37.5 38.5 38  20 cm proximal to floor at lateral plantar foot 34.2 33 31.8 31 31.2 30.5 30 32 31.5  10 cm proximal to floor at lateral plantar foot 27 26.5 25.3 25.3 24.4 25 24 25  25.7  Circumference of ankle/heel 33 31.5 30.5 29.7 31.5 31 30.8 31 31.3  5 cm proximal to 1st MTP joint 27 25.6 23.5 23.4 24 24.2 24 23.8 23.8  Across MTP joint 23.9 24.2 22.5 21.5 23 20.8 21.8 21.8 21.7  Around proximal great toe 8.5 8.1 8.1 8.1 8 8.3 8.5 8.6 8.4  (Blank rows = not tested)  LE LANDMARK LEFT eval 04/15/24 04/22/24 04/29/24 05/06/24 05/13/24 05/20/24 05/27/24 06/02/24  At groin           30 cm proximal to suprapatella           20 cm proximal to suprapatella           10 cm proximal to suprapatella           At midpatella / popliteal crease           30 cm proximal to floor at lateral plantar foot 42.1 39.7 39 38.2 40 39 37.5 39.3 38.7  20 cm proximal to floor at lateral plantar foot 34.7 35 31.5 31.5 32.5 31.8 30.7 32.3 31  10  cm proximal to floor at lateral plantar foot 26.6 25.7 24.3 23.5 24.5 25 24.3 25 24.2  Circumference of ankle/heel 32.2 31.6 29 29  30.5 30.1 29.7 31 29.6  5 cm proximal to 1st MTP joint 25.2 24.4 23.1 22.4 23.5 24 23.3 23 23.5  Across MTP joint 22.3 22 21.7 22.3 22.4 22.3 23 22.5 22.8  Around proximal great toe 8.8 8.1 8.1 8 8.2  8.4 8.4 8.7  (Blank rows = not tested)    TODAY'S TREATMENT:  DATE:  06/02/24: measurement Manual decongestive techniques anterior only, reclined in power WC to include supraclavicular, deep and superficial abdominal, inguinal/axillary anastomsis and LE.  Manual completed anterior only to B LE.   Compression bandaging using 1/2" foam and multilayer short stretch bandaging from toes to knee B. Soft material liner #10, orange foam on dorsal aspect  Eval:  Education details: What lymphedema is four aspects to treatment.  exercises Person educated: Patient Education method: Explanation, Demonstration, Verbal cues, and Handouts Education comprehension: verbalized understanding  HOME EXERCISE PROGRAM:Sitting:  Ankle pumps, LAQ, hip ab/adduction, marching and diaphragm breathing. (Note pt is only able to complete less than a 1/4 of ROM due to spastic CP, however, therapist encouraged pt to complete 2x /day as this will still increase lymphatic circulation, maybe not as much as if she could complete full ROM but something is better than nothing.)  ASSESSMENT:  CLINICAL IMPRESSION:    PT volume appears to be stabilizing.  PT volume increased significantly when pt washed her bandages and had to go without compression.  Therapist will contact companies who were to supply the juxtafit and compression pump.    PT will continue to benefit from skilled PT to reduce edema and ensure I in maintenance of compression pump and juxtafit.   OBJECTIVE IMPAIRMENTS: decreased ROM, increased edema, and pain.   ACTIVITY LIMITATIONS: dressing and hygiene/grooming  PERSONAL FACTORS: Fitness and 1 comorbidity: spastic CP are also affecting patient's functional outcome.   REHAB POTENTIAL: Good  CLINICAL DECISION MAKING: Evolving/moderate complexity  EVALUATION COMPLEXITY: Moderate  GOALS: Goals reviewed with patient? No  SHORT TERM GOALS: Target date: 04/27/24  PT to be I with HEP Baseline: Goal status: met  2.  PT to have lost 2 cm in foot and LE to reduce risk of cellulitis  Baseline:  Goal status: met   LONG TERM GOALS: Target date: 05/18/24  PT pain to decrease to no greater than a 2 Baseline:  Goal status: met  2.  PT to have lost 4cm  foot measurement to be able to fit into a shoe. Baseline:  Goal status: on-going  3.  PT leg measurements to be decreased 3-4 cm to allow improved skin integrity. (Currently very dry and slighty red).  Baseline:  Goal status: partially met    PLAN:  PT FREQUENCY:  2x/week  PT DURATION: 6 weeks  PLANNED INTERVENTIONS: 97110-Therapeutic exercises, 97535- Self Care, and 62130- Manual therapy  PLAN FOR NEXT SESSION: total decongestive techinques, wrap to Bil knees, measure on Fridays.    Leodis Rainwater, PT CLT [phone]     06/02/2024, 11:19 AM

## 2024-06-02 NOTE — Therapy (Signed)
 Therapist called [facility] who stated that they were still waiting on a signature from the physician.    The therapist then called the physican office  And spoke to [facility] who stated the prescription was emailed to her she would make sure that the MD signed the prescription.    Therapist then called [facility] back with this information who stated that they would send the prescription back to the MD office.    Therapist then contacted Carlus Chihuahua at [facility] about the compression pump.    Christine Gomez, PT CLT [phone]

## 2024-06-03 ENCOUNTER — Encounter (HOSPITAL_COMMUNITY): Admitting: Physical Therapy

## 2024-06-06 ENCOUNTER — Encounter (HOSPITAL_COMMUNITY)

## 2024-06-08 ENCOUNTER — Ambulatory Visit (HOSPITAL_COMMUNITY): Payer: MEDICAID | Admitting: Physical Therapy

## 2024-06-08 DIAGNOSIS — I890 Lymphedema, not elsewhere classified: Secondary | ICD-10-CM

## 2024-06-08 NOTE — Therapy (Signed)
 OUTPATIENT PHYSICAL THERAPY LYMPHEDEMA TREATMENT  Patient Name: Christine Gomez MRN: [id number] DOB:[birth year], 38 y.o., female Today's Date: 06/08/2024    END OF SESSION:  PT End of Session - 06/08/24 1155     Visit Number 18    Number of Visits 24    Date for PT Re-Evaluation 06/15/24    Authorization Type Marelyn Shan put in    Authorization Time Period complete approved 18 visits from 04/06/2024-05/18/2024    Authorization - Number of Visits 18    Progress Note Due on Visit 18    PT Start Time 0915    PT Stop Time 1010    PT Time Calculation (min) 55 min    Activity Tolerance Patient tolerated treatment well    Behavior During Therapy [facility] for tasks assessed/performed                  Past Medical History:  Diagnosis Date   Acid reflux    Cerebral palsy (HCC)    Past Surgical History:  Procedure Laterality Date   dorsal rhizotomy  1996   back ligament looosening surgery   ESOPHAGOGASTRODUODENOSCOPY (EGD) WITH PROPOFOL  N/A 10/08/2016   Procedure: ESOPHAGOGASTRODUODENOSCOPY (EGD) WITH PROPOFOL ;  Surgeon: Evangeline Hilts, MD;  Location: [facility];  Service: Endoscopy;  Laterality: N/A;   left arm surgery  03/2021   Patient Active Problem List   Diagnosis Date Noted   Dysphagia 05/31/2021   Epigastric pain 05/31/2021   Gastroesophageal reflux disease 05/31/2021   Hematemesis 05/31/2021   Hiatal hernia 05/31/2021   Microcytic anemia 05/31/2021   Sagittal band rupture, extensor tendon, nontraumatic, left 05/01/2021   Cerebral palsy (HCC) 04/25/2020   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   Other acquired deformity of other parts of limb 04/25/2020   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   Deformity of forearm, excluding fingers 04/25/2020   Urinary incontinence 04/25/2020   Iron deficiency anemia due to chronic blood loss 07/10/2017    PCP: Rhett Cella   REFERRING PROVIDER:  Armand Lamy, DO  REFERRING DIAG: I89.0 (ICD-10-CM) - Lymphedema, not elsewhere classified  THERAPY DIAG:  I89.0 (ICD-10-CM) - Lymphedema, not elsewhere classified  Rationale for Evaluation and Treatment: Rehabilitation  ONSET DATE: 06/28/24  SUBJECTIVE:                                                                                                                                                                                           SUBJECTIVE STATEMENT:  PT has not heard about her pump or her juxtafit at this time.    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  Pt received 19 treatments of PT in [place] last year attempting to improve her mobility  PAIN:  Are you having pain? Only with pressure, 5/10   PRECAUTIONS: Other: cellulitis  RED FLAGS: None   WEIGHT BEARING RESTRICTIONS: No; pt needs assistance with transfers uses a wheelchair for locomotion.     FALLS:  Has patient fallen in last 6 months? No  LIVING ENVIRONMENT: Lives with: roomates who do not assist in her care.  She has an aide for 3 hours in the mornings and voc rehab for 4 hours in the evenings.  House/apartment Stairs: No Has following equipment at home: Wheelchair (power)   PRIOR LEVEL OF FUNCTION: Requires assistive device for independence  PATIENT GOALS: To be able to wear shoes again   OBJECTIVE: Note: Objective measures were completed at Evaluation unless otherwise noted.  COGNITION: Overall cognitive status: Within functional limits for tasks assessed   PALPATION: Noted induration of LE with noted B dorsal hump on feet.    LYMPHEDEMA ASSESSMENTS:     LE LANDMARK RIGHT eval 04/15/24 04/22/24 04/29/24 05/06/24 05/13/24 05/20/24 05/27/24 06/02/24  At groin           30 cm proximal to suprapatella           20 cm proximal to suprapatella           10 cm proximal to suprapatella           At midpatella / popliteal crease           30 cm proximal to floor at  lateral plantar foot 40.2 39 38 37 36.3 36.7 37.5 38.5 38  20 cm proximal to floor at lateral plantar foot 34.2 33 31.8 31 31.2 30.5 30 32 31.5  10 cm proximal to floor at lateral plantar foot 27 26.5 25.3 25.3 24.4 25 24 25  25.7  Circumference of ankle/heel 33 31.5 30.5 29.7 31.5 31 30.8 31 31.3  5 cm proximal to 1st MTP joint 27 25.6 23.5 23.4 24 24.2 24 23.8 23.8  Across MTP joint 23.9 24.2 22.5 21.5 23 20.8 21.8 21.8 21.7  Around proximal great toe 8.5 8.1 8.1 8.1 8 8.3 8.5 8.6 8.4  (Blank rows = not tested)  LE LANDMARK LEFT eval 04/15/24 04/22/24 04/29/24 05/06/24 05/13/24 05/20/24 05/27/24 06/02/24  At groin           30 cm proximal to suprapatella           20 cm proximal to suprapatella           10 cm proximal to suprapatella           At midpatella / popliteal crease           30 cm proximal to floor at lateral plantar foot 42.1 39.7 39 38.2 40 39 37.5 39.3 38.7  20 cm proximal to floor at lateral plantar foot 34.7 35 31.5 31.5 32.5 31.8 30.7 32.3 31  10  cm proximal to floor at lateral plantar foot 26.6 25.7 24.3 23.5 24.5 25 24.3 25 24.2  Circumference of ankle/heel 32.2 31.6 29 29  30.5 30.1 29.7 31 29.6  5 cm proximal to 1st MTP joint 25.2 24.4 23.1 22.4 23.5 24 23.3 23 23.5  Across MTP joint 22.3 22 21.7 22.3 22.4 22.3 23 22.5 22.8  Around proximal great toe 8.8 8.1 8.1 8 8.2  8.4 8.4 8.7  (Blank rows = not tested)  TODAY'S TREATMENT:                                                                                                                              DATE:  06/08/24:  Manual decongestive techniques anterior only, reclined in power WC to include supraclavicular, deep and superficial abdominal, inguinal/axillary anastomsis and LE.  Manual completed anterior only to B LE.   Compression bandaging using 1/2 foam and multilayer short stretch bandaging from toes to knee B. Soft material liner #10, orange foam on dorsal aspect   HOME EXERCISE PROGRAM:Sitting:  Ankle pumps, LAQ,  hip ab/adduction, marching and diaphragm breathing. (Note pt is only able to complete less than a 1/4 of ROM due to spastic CP, however, therapist encouraged pt to complete 2x /day as this will still increase lymphatic circulation, maybe not as much as if she could complete full ROM but something is better than nothing.)  ASSESSMENT:  CLINICAL IMPRESSION:    PT volumes increased significantly when pt washed her bandages and had to go without compression therefore pt can not go without compression.  Awaiting juxtafit to allow pt to have compression on LE once discharged.  PT will continue to benefit from skilled PT to reduce edema and ensure I in maintenance of compression pump and juxtafit.   OBJECTIVE IMPAIRMENTS: decreased ROM, increased edema, and pain.   ACTIVITY LIMITATIONS: dressing and hygiene/grooming  PERSONAL FACTORS: Fitness and 1 comorbidity: spastic CP are also affecting patient's functional outcome.   REHAB POTENTIAL: Good  CLINICAL DECISION MAKING: Evolving/moderate complexity  EVALUATION COMPLEXITY: Moderate  GOALS: Goals reviewed with patient? No  SHORT TERM GOALS: Target date: 04/27/24  PT to be I with HEP Baseline: Goal status: met  2.  PT to have lost 2 cm in foot and LE to reduce risk of cellulitis  Baseline:  Goal status: met   LONG TERM GOALS: Target date: 05/18/24  PT pain to decrease to no greater than a 2 Baseline:  Goal status: met  2.  PT to have lost 4cm  foot measurement to be able to fit into a shoe. Baseline:  Goal status: on-going  3.  PT leg measurements to be decreased 3-4 cm to allow improved skin integrity. (Currently very dry and slighty red).  Baseline:  Goal status: partially met    PLAN:  PT FREQUENCY: 2x/week  PT DURATION: 6 weeks  PLANNED INTERVENTIONS: 97110-Therapeutic exercises, 97535- Self Care, and 81191- Manual therapy  PLAN FOR NEXT SESSION: total decongestive techinques, wrap to Bil knees, measure on Fridays.     Leodis Rainwater, PT CLT [phone]     06/08/2024, 11:58 AM

## 2024-06-10 ENCOUNTER — Encounter (HOSPITAL_COMMUNITY): Admitting: Physical Therapy

## 2024-06-10 ENCOUNTER — Ambulatory Visit (HOSPITAL_COMMUNITY): Payer: MEDICAID | Admitting: Physical Therapy

## 2024-06-10 DIAGNOSIS — G800 Spastic quadriplegic cerebral palsy: Secondary | ICD-10-CM

## 2024-06-10 DIAGNOSIS — I890 Lymphedema, not elsewhere classified: Secondary | ICD-10-CM

## 2024-06-10 NOTE — Therapy (Addendum)
 OUTPATIENT PHYSICAL THERAPY LYMPHEDEMA TREATMENT  Patient Name: Christine Gomez MRN: [id number] DOB:01-Aug-1986, 38 y.o., female Today's Date: 06/10/2024    END OF SESSION:  PT End of Session - 06/10/24 1039     Visit Number 19    Number of Visits 24    Date for PT Re-Evaluation 06/15/24    Authorization Type [employer]    Authorization Time Period complete approved 18 visits from 04/06/2024-05/18/2024; pt had [employer] which required pre auth not put in until today able to retro 3 visits.    Authorization - Visit Number 6   3 visits used at another facility   Authorization - Number of Visits 27    Progress Note Due on Visit 24    PT Start Time 0918    PT Stop Time 1027    PT Time Calculation (min) 69 min    Activity Tolerance Patient tolerated treatment well    Behavior During Therapy [facility] for tasks assessed/performed                Past Medical History:  Diagnosis Date   Acid reflux    Cerebral palsy (HCC)    Past Surgical History:  Procedure Laterality Date   dorsal rhizotomy  1996   back ligament looosening surgery   ESOPHAGOGASTRODUODENOSCOPY (EGD) WITH PROPOFOL  N/A 10/08/2016   Procedure: ESOPHAGOGASTRODUODENOSCOPY (EGD) WITH PROPOFOL ;  Surgeon: Evangeline Hilts, MD;  Location: [facility];  Service: Endoscopy;  Laterality: N/A;   left arm surgery  03/2021   Patient Active Problem List   Diagnosis Date Noted   Dysphagia 05/31/2021   Epigastric pain 05/31/2021   Gastroesophageal reflux disease 05/31/2021   Hematemesis 05/31/2021   Hiatal hernia 05/31/2021   Microcytic anemia 05/31/2021   Sagittal band rupture, extensor tendon, nontraumatic, left 05/01/2021   Cerebral palsy (HCC) 04/25/2020   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   Other acquired deformity of other parts of limb 04/25/2020   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  Pt received 19 treatments of PT in [place] last year attempting to improve her mobility  PAIN:  Are you having pain? Only with pressure, 5/10   PRECAUTIONS: Other: cellulitis  RED FLAGS: None   WEIGHT BEARING RESTRICTIONS: No; pt needs assistance with transfers uses a wheelchair for locomotion.     FALLS:  Has patient fallen in last 6 months? No  LIVING ENVIRONMENT: Lives with: roomates who do not assist in her care.  She has an aide for 3 hours in the mornings and voc rehab for 4 hours in the evenings.  House/apartment Stairs: No Has following equipment at home: Wheelchair (power)   PRIOR LEVEL OF FUNCTION: Requires assistive device for independence  PATIENT GOALS: To be able to wear shoes again   OBJECTIVE: Note: Objective measures were completed at Evaluation unless otherwise noted.  COGNITION: Overall cognitive status: Within functional limits for tasks assessed   PALPATION: Noted induration of LE with noted B dorsal hump on feet.    LYMPHEDEMA ASSESSMENTS:     LE LANDMARK RIGHT eval 04/15/24 04/22/24 04/29/24 05/06/24 05/13/24 05/20/24 05/27/24 06/02/24 06/10/24  At groin            30 cm proximal to suprapatella            20 cm  proximal to suprapatella            10 cm proximal to suprapatella            At midpatella / popliteal crease            30 cm proximal to floor at lateral plantar foot 40.2 39 38 37 36.3 36.7 37.5 38.5 38 38  20 cm proximal to floor at lateral plantar foot 34.2 33 31.8 31 31.2 30.5 30 32 31.5 31  10  cm proximal to floor at lateral plantar foot 27 26.5 25.3 25.3 24.4 25 24 25  25.7 25.2  Circumference of ankle/heel 33 31.5 30.5 29.7 31.5 31 30.8 31 31.3 30.7  5 cm proximal to 1st MTP joint 27 25.6 23.5 23.4 24 24.2 24 23.8 23.8 24  Across MTP joint 23.9 24.2 22.5 21.5 23 20.8 21.8 21.8 21.7 21.7  Around proximal great toe 8.5 8.1 8.1 8.1 8 8.3 8.5 8.6 8.4   (Blank rows = not tested)  LE LANDMARK LEFT eval 04/15/24 04/22/24 04/29/24 05/06/24 05/13/24 05/20/24 05/27/24 06/02/24 06/10/24  At groin            30 cm proximal to suprapatella            20 cm proximal to suprapatella            10 cm proximal to suprapatella            At midpatella / popliteal crease            30 cm proximal to floor at lateral plantar foot 42.1 39.7 39 38.2 40 39 37.5 39.3 38.7  38.7  20 cm proximal to floor at lateral plantar foot 34.7 35 31.5 31.5 32.5 31.8 30.7 32.3 31 30.2  10 cm proximal to floor at lateral plantar foot 26.6 25.7 24.3 23.5 24.5 25 24.3 25 24.2 23.5  Circumference of ankle/heel 32.2 31.6 29 29  30.5 30.1 29.7 31 29.6 29.8  5 cm proximal to 1st MTP  joint 25.2 24.4 23.1 22.4 23.5 24 23.3 23 23.5 23.5  Across MTP joint 22.3 22 21.7 22.3 22.4 22.3 23 22.5 22.8 23.2  Around proximal great toe 8.8 8.1 8.1 8 8.2  8.4 8.4 8.7 8.3  (Blank rows = not tested)    TODAY'S TREATMENT:                                                                                                                              DATE:  06/10/24: measurement  Manual decongestive techniques anterior only, reclined in power WC to include supraclavicular, deep and superficial abdominal, inguinal/axillary anastomsis and LE.  Manual  completed anterior only to B LE.   Profore compression bandage sx with 1/2 foam B    HOME EXERCISE PROGRAM:Sitting:  Ankle pumps, LAQ, hip ab/adduction, marching and diaphragm breathing. (Note pt is only able to complete less than a 1/4 of ROM due to spastic CP, however, therapist encouraged pt to complete 2x /day as this will still increase lymphatic circulation, maybe not as much as if she could complete full ROM but something is better than nothing.)  ASSESSMENT:  CLINICAL IMPRESSION:    Pt measured for juxtafit, faxed information to [facility].  Pt is still slowly reducing in LE but dorsal hump remains.  Therapist placed two dorsal foam on feet in attempt to decreased edema in this are.  Pt father, who is her ride, is out of town next week therefore therapist used Profore to allow bandages to stay on longer.   OBJECTIVE IMPAIRMENTS: decreased ROM, increased edema, and pain.   ACTIVITY LIMITATIONS: dressing and hygiene/grooming  PERSONAL FACTORS: Fitness and 1 comorbidity: spastic CP are also affecting patient's functional outcome.   REHAB POTENTIAL: Good  CLINICAL DECISION MAKING: Evolving/moderate complexity  EVALUATION COMPLEXITY: Moderate  GOALS: Goals reviewed with patient? No  SHORT TERM GOALS: Target date: 04/27/24  PT to be I with HEP Baseline: Goal status: met  2.  PT to have lost 2 cm in foot and LE to reduce risk of cellulitis  Baseline:  Goal status: met   LONG TERM GOALS: Target date: 05/18/24  PT pain to decrease to no greater than a 2 Baseline:  Goal status: met  2.  PT to have lost 4cm  foot measurement to be able to fit into a shoe. Baseline:  Goal status: on-going  3.  PT leg measurements to be decreased 3-4 cm to allow improved skin integrity. (Currently very dry and slighty red).  Baseline:  Goal status: partially met    PLAN:  PT FREQUENCY: 2x/week  PT DURATION: 6 weeks  PLANNED INTERVENTIONS: 97110-Therapeutic exercises, 97535- Self Care,  and 14782- Manual therapy  PLAN FOR NEXT SESSION: total decongestive techinques, wrap to Bil knees, measure on Fridays.    Leodis Rainwater, PT CLT [phone]     06/10/2024, 10:56 AM

## 2024-06-13 ENCOUNTER — Telehealth (HOSPITAL_COMMUNITY): Payer: Self-pay

## 2024-06-13 ENCOUNTER — Encounter (HOSPITAL_COMMUNITY)

## 2024-06-13 NOTE — Telephone Encounter (Signed)
 No show, called and spoke to pt who thought her apt was already cancelled as father is out of town this week. Cancelled remaining apts this week and reminded next apt date and time.   Minor Amble, LPTA/CLT; Johnye Napoleon [phone]

## 2024-06-17 ENCOUNTER — Encounter (HOSPITAL_COMMUNITY): Admitting: Physical Therapy

## 2024-06-20 ENCOUNTER — Encounter (HOSPITAL_COMMUNITY)

## 2024-06-22 ENCOUNTER — Encounter (HOSPITAL_COMMUNITY): Admitting: Physical Therapy

## 2024-06-24 ENCOUNTER — Ambulatory Visit (HOSPITAL_COMMUNITY): Payer: MEDICAID | Admitting: Physical Therapy

## 2024-06-24 ENCOUNTER — Encounter (HOSPITAL_COMMUNITY): Admitting: Physical Therapy

## 2024-06-24 DIAGNOSIS — I890 Lymphedema, not elsewhere classified: Secondary | ICD-10-CM | POA: Diagnosis not present

## 2024-06-24 DIAGNOSIS — G800 Spastic quadriplegic cerebral palsy: Secondary | ICD-10-CM

## 2024-06-24 NOTE — Therapy (Signed)
 OUTPATIENT PHYSICAL THERAPY LYMPHEDEMA TREATMENT  Patient Name: Christine Gomez MRN: [id number] DOB:August 28, 1986, 38 y.o., female Today's Date: 06/24/2024    END OF SESSION:  PT End of Session - 06/24/24 1418     Visit Number 20    Number of Visits 29    Date for PT Re-Evaluation 07/08/24    Authorization Type [employer]    Authorization Time Period 11 visits approved from 6/13=-9/10    Authorization - Visit Number 2    Authorization - Number of Visits 11    Progress Note Due on Visit 29    PT Start Time 0930    PT Stop Time 1025    PT Time Calculation (min) 55 min    Activity Tolerance Patient tolerated treatment well    Behavior During Therapy WFL for tasks assessed/performed                 Past Medical History:  Diagnosis Date   Acid reflux    Cerebral palsy (HCC)    Past Surgical History:  Procedure Laterality Date   dorsal rhizotomy  1996   back ligament looosening surgery   ESOPHAGOGASTRODUODENOSCOPY (EGD) WITH PROPOFOL  N/A 10/08/2016   Procedure: ESOPHAGOGASTRODUODENOSCOPY (EGD) WITH PROPOFOL ;  Surgeon: Elsie Cree, MD;  Location: [facility];  Service: Endoscopy;  Laterality: N/A;   left arm surgery  03/2021   Patient Active Problem List   Diagnosis Date Noted   Dysphagia 05/31/2021   Epigastric pain 05/31/2021   Gastroesophageal reflux disease 05/31/2021   Hematemesis 05/31/2021   Hiatal hernia 05/31/2021   Microcytic anemia 05/31/2021   Sagittal band rupture, extensor tendon, nontraumatic, left 05/01/2021   Cerebral palsy (HCC) 04/25/2020   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   Other acquired deformity of other parts of limb 04/25/2020   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   Deformity of forearm, excluding fingers 04/25/2020   Urinary incontinence 04/25/2020   Iron deficiency anemia due to chronic blood loss 07/10/2017    PCP: Suzen Lager   REFERRING PROVIDER:  Ladora Jaynie SQUIBB, DO  REFERRING DIAG: I89.0 (ICD-10-CM) - Lymphedema, not elsewhere classified  THERAPY DIAG:  I89.0 (ICD-10-CM) - Lymphedema, not elsewhere classified  Rationale for Evaluation and Treatment: Rehabilitation  ONSET DATE: 06/28/24  SUBJECTIVE:                                                                                                                                                                                           SUBJECTIVE STATEMENT:  PT states that she has received her pump and has used it once.  She has  also received her juxtafit but did not bring them and has not had them on.  PT has not been to therapy since 6/13 due to not having transportation .    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  Pt received 19 treatments of PT in [place] last year attempting to improve her mobility  PAIN:  Are you having pain? Only with pressure, 5/10   PRECAUTIONS: Other: cellulitis  RED FLAGS: None   WEIGHT BEARING RESTRICTIONS: No; pt needs assistance with transfers uses a wheelchair for locomotion.     FALLS:  Has patient fallen in last 6 months? No  LIVING ENVIRONMENT: Lives with: roomates who do not assist in her care.  She has an aide for 3 hours in the mornings and voc rehab for 4 hours in the evenings.  House/apartment Stairs: No Has following equipment at home: Wheelchair (power)   PRIOR LEVEL OF FUNCTION: Requires assistive device for independence  PATIENT GOALS: To be able to wear shoes again   OBJECTIVE: Note: Objective measures were completed at Evaluation unless otherwise noted.  COGNITION: Overall cognitive status: Within functional limits for tasks assessed   PALPATION: Noted induration of LE with noted B dorsal hump on feet.    LYMPHEDEMA ASSESSMENTS:     LE LANDMARK RIGHT eval 04/15/24 04/22/24 04/29/24 05/06/24 05/13/24 05/20/24 05/27/24 06/02/24 06/10/24 06/24/24  At groin             30 cm proximal to  suprapatella             20 cm proximal to suprapatella             10 cm proximal to suprapatella             At midpatella / popliteal crease             30 cm proximal to floor at lateral plantar foot 40.2 39 38 37 36.3 36.7 37.5 38.5 38 38 38.5  20 cm proximal to floor at lateral plantar foot 34.2 33 31.8 31 31.2 30.5 30 32 31.5 31 34.1  10 cm proximal to floor at lateral plantar foot 27 26.5 25.3 25.3 24.4 25 24 25  25.7 25.2 27.7  Circumference of ankle/heel 33 31.5 30.5 29.7 31.5 31 30.8 31 31.3 30.7 32.7  5 cm proximal to 1st MTP joint 27 25.6 23.5 23.4 24 24.2 24 23.8 23.8 24 26.7  Across MTP joint 23.9 24.2 22.5 21.5 23 20.8 21.8 21.8 21.7 21.7 25  Around proximal great toe 8.5 8.1 8.1 8.1 8 8.3 8.5 8.6 8.4    (Blank rows = not tested)  LE LANDMARK LEFT eval 04/15/24 04/22/24 04/29/24 05/06/24 05/13/24 05/20/24 05/27/24 06/02/24 06/10/24 06/24/24  At groin             30 cm proximal to suprapatella             20 cm proximal to suprapatella             10 cm proximal to suprapatella             At midpatella / popliteal crease             30 cm proximal to floor at lateral plantar foot 42.1 39.7 39 38.2 40 39 37.5 39.3 38.7  38.7 40.3  20 cm proximal to floor at lateral plantar foot 34.7 35 31.5 31.5 32.5 31.8 30.7 32.3 31 30.2 34  10 cm proximal to floor  at lateral plantar foot 26.6 25.7 24.3 23.5 24.5 25 24.3 25 24.2 23.5 26.4  Circumference of ankle/heel 32.2 31.6 29 29  30.5 30.1 29.7 31 29.6 29.8 32  5 cm proximal to 1st MTP joint 25.2 24.4 23.1 22.4 23.5 24 23.3 23 23.5 23.5 25  Across MTP joint 22.3 22 21.7 22.3 22.4 22.3 23 22.5 22.8 23.2 24.4  Around proximal great toe 8.8 8.1 8.1 8 8.2  8.4 8.4 8.7 8.3 8.8  (Blank rows = not tested)    TODAY'S TREATMENT:                                                                                                                              DATE:  06/24/24: measurement  Manual decongestive techniques anterior only, reclined in power WC to  include supraclavicular, deep and superficial abdominal, inguinal/axillary anastomsis and LE.  Manual completed anterior only to B LE.   Pt bandaged with multiple short stretch bandages and 1/2 foam.    HOME EXERCISE PROGRAM:Sitting:  Ankle pumps, LAQ, hip ab/adduction, marching and diaphragm breathing. (Note pt is only able to complete less than a 1/4 of ROM due to spastic CP, however, therapist encouraged pt to complete 2x /day as this will still increase lymphatic circulation, maybe not as much as if she could complete full ROM but something is better than nothing.)  ASSESSMENT:  CLINICAL IMPRESSION:    PT has not been to treatment in two weeks.  She has just received her pump and juxtafit, unfortunately over the two weeks she has gained back almost all the edema that she had lost.  Therapist completed total decongestive techniques.  Pt is limited on when she can come as she is w/c bound and dependent on others for transportation.  Therapist encouraged pt to take bandages off on Monday, pump for an hour and then have her aide apply her juxtafit. Pump and wear juxtafit every day.   OBJECTIVE IMPAIRMENTS: decreased ROM, increased edema, and pain.   ACTIVITY LIMITATIONS: dressing and hygiene/grooming  PERSONAL FACTORS: Fitness and 1 comorbidity: spastic CP are also affecting patient's functional outcome.   REHAB POTENTIAL: Good  CLINICAL DECISION MAKING: Evolving/moderate complexity  EVALUATION COMPLEXITY: Moderate  GOALS: Goals reviewed with patient? No  SHORT TERM GOALS: Target date: 04/27/24  PT to be I with HEP Baseline: Goal status: met  2.  PT to have lost 2 cm in foot and LE to reduce risk of cellulitis  Baseline:  Goal status: met   LONG TERM GOALS: Target date: 05/18/24  PT pain to decrease to no greater than a 2 Baseline:  Goal status: met  2.  PT to have lost 4cm  foot measurement to be able to fit into a shoe. Baseline:  Goal status: on-going  3.  PT leg  measurements to be decreased 3-4 cm to allow improved skin integrity. (Currently very dry and slighty red).  Baseline:  Goal status: partially met  PLAN:  PT FREQUENCY: 2x/week  PT DURATION: 6 weeks  PLANNED INTERVENTIONS: 97110-Therapeutic exercises, 97535- Self Care, and [id number]- Manual therapy  PLAN FOR NEXT SESSION: total decongestive techinques, wrap to Bil knees, measure on Fridays.    Montie Metro, PT CLT [phone]     06/24/2024, 2:21 PM

## 2024-06-27 ENCOUNTER — Encounter (HOSPITAL_COMMUNITY): Admitting: Physical Therapy

## 2024-06-30 ENCOUNTER — Ambulatory Visit (HOSPITAL_COMMUNITY): Attending: Family Medicine | Admitting: Physical Therapy

## 2024-06-30 DIAGNOSIS — I890 Lymphedema, not elsewhere classified: Secondary | ICD-10-CM | POA: Insufficient documentation

## 2024-06-30 DIAGNOSIS — M6281 Muscle weakness (generalized): Secondary | ICD-10-CM | POA: Insufficient documentation

## 2024-06-30 DIAGNOSIS — G800 Spastic quadriplegic cerebral palsy: Secondary | ICD-10-CM | POA: Diagnosis present

## 2024-06-30 NOTE — Therapy (Signed)
 OUTPATIENT PHYSICAL THERAPY LYMPHEDEMA TREATMENT  Patient Name: Christine Gomez MRN: [id number] DOB:1986-08-20, 38 y.o., female Today's Date: 06/30/2024    END OF SESSION:  PT End of Session - 06/30/24 1236     Number of Visits 29    Date for PT Re-Evaluation 07/08/24    Authorization Type [employer]    Authorization Time Period 11 visits approved from 6/13=-9/10    Authorization - Number of Visits 11    Progress Note Due on Visit 29    PT Start Time 0925    PT Stop Time 1014    PT Time Calculation (min) 49 min    Activity Tolerance Patient tolerated treatment well    Behavior During Therapy [facility] for tasks assessed/performed                  Past Medical History:  Diagnosis Date   Acid reflux    Cerebral palsy (HCC)    Past Surgical History:  Procedure Laterality Date   dorsal rhizotomy  1996   back ligament looosening surgery   ESOPHAGOGASTRODUODENOSCOPY (EGD) WITH PROPOFOL  N/A 10/08/2016   Procedure: ESOPHAGOGASTRODUODENOSCOPY (EGD) WITH PROPOFOL ;  Surgeon: Elsie Cree, MD;  Location: [facility];  Service: Endoscopy;  Laterality: N/A;   left arm surgery  03/2021   Patient Active Problem List   Diagnosis Date Noted   Dysphagia 05/31/2021   Epigastric pain 05/31/2021   Gastroesophageal reflux disease 05/31/2021   Hematemesis 05/31/2021   Hiatal hernia 05/31/2021   Microcytic anemia 05/31/2021   Sagittal band rupture, extensor tendon, nontraumatic, left 05/01/2021   Cerebral palsy (HCC) 04/25/2020   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   Other acquired deformity of other parts of limb 04/25/2020   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   Deformity of forearm, excluding fingers 04/25/2020   Urinary incontinence 04/25/2020   Iron deficiency anemia due to chronic blood loss 07/10/2017    PCP: Suzen Lager   REFERRING PROVIDER: Ladora Jaynie SQUIBB, DO  REFERRING DIAG: I89.0 (ICD-10-CM) -  Lymphedema, not elsewhere classified  THERAPY DIAG:  I89.0 (ICD-10-CM) - Lymphedema, not elsewhere classified  Rationale for Evaluation and Treatment: Rehabilitation  ONSET DATE: 06/28/24  SUBJECTIVE:                                                                                                                                                                                           SUBJECTIVE STATEMENT:  Pt states that she has been using her pump as instructed, however, her aide is not sure how to don the juxta fit and wanted the patient to find  out how to do so before implementing donning the juxtafit's into the dressing routine. 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  Pt received 19 treatments of PT in [place] last year attempting to improve her mobility  PAIN:  Are you having pain? Only with pressure, 5/10   PRECAUTIONS: Other: cellulitis  RED FLAGS: None   WEIGHT BEARING RESTRICTIONS: No; pt needs assistance with transfers uses a wheelchair for locomotion.     FALLS:  Has patient fallen in last 6 months? No  LIVING ENVIRONMENT: Lives with: roomates who do not assist in her care.  She has an aide for 3 hours in the mornings and voc rehab for 4 hours in the evenings.  House/apartment Stairs: No Has following equipment at home: Wheelchair (power)   PRIOR LEVEL OF FUNCTION: Requires assistive device for independence  PATIENT GOALS: To be able to wear shoes again   OBJECTIVE: Note: Objective measures were completed at Evaluation unless otherwise noted.  COGNITION: Overall cognitive status: Within functional limits for tasks assessed   PALPATION: Noted induration of LE with noted B dorsal hump on feet.    LYMPHEDEMA ASSESSMENTS:     LE LANDMARK RIGHT eval 04/15/24 04/22/24 04/29/24 05/06/24 05/13/24 05/20/24 05/27/24 06/02/24 06/10/24 06/24/24 06/30/24  At groin               30 cm proximal to suprapatella              20 cm proximal to suprapatella              10 cm proximal to suprapatella              At midpatella / popliteal crease              30 cm proximal to floor at lateral plantar foot 40.2 39 38 37 36.3 36.7 37.5 38.5 38 38 38.5 38  20 cm proximal to floor at lateral plantar foot 34.2 33 31.8 31 31.2 30.5 30 32 31.5 31 34.1 32.5  10 cm proximal to floor at lateral plantar foot 27 26.5 25.3 25.3 24.4 25 24 25  25.7 25.2 27.7 26.7  Circumference of ankle/heel 33 31.5 30.5 29.7 31.5 31 30.8 31 31.3 30.7 32.7 32  5 cm proximal to 1st MTP joint 27 25.6 23.5 23.4 24 24.2 24 23.8 23.8 24 26.7 25  Across MTP joint 23.9 24.2 22.5 21.5 23 20.8 21.8 21.8 21.7 21.7 25 23   Around proximal great toe 8.5 8.1 8.1 8.1 8 8.3 8.5 8.6 8.4   8.6  (Blank rows = not tested)  LE LANDMARK LEFT eval 04/15/24 04/22/24 04/29/24 05/06/24 05/13/24 05/20/24 05/27/24 06/02/24 06/10/24 06/24/24 06/30/24  At groin              30 cm proximal to suprapatella              20 cm proximal to suprapatella              10 cm proximal to suprapatella              At midpatella / popliteal crease              30 cm proximal to floor at lateral plantar foot 42.1 39.7 39 38.2 40 39 37.5 39.3 38.7  38.7 40.3 39.9  20 cm proximal to floor at lateral plantar foot 34.7 35 31.5  31.5 32.5 31.8 30.7 32.3 31 30.2 34 33  10 cm proximal to floor at lateral plantar foot 26.6 25.7 24.3 23.5 24.5 25 24.3 25 24.2 23.5 26.4 25  Circumference of ankle/heel 32.2 31.6 29 29  30.5 30.1 29.7 31 29.6 29.8 32 31.2  5 cm proximal to 1st MTP joint 25.2 24.4 23.1 22.4 23.5 24 23.3 23 23.5 23.5 25 24.5  Across MTP joint 22.3 22 21.7 22.3 22.4 22.3 23 22.5 22.8 23.2 24.4 23.3  Around proximal great toe 8.8 8.1 8.1 8 8.2  8.4 8.4 8.7 8.3 8.8 8.3  (Blank rows = not tested)    TODAY'S TREATMENT:                                                                                                                              DATE:   06/30/24: measurement  Manual decongestive techniques anterior only, reclined in power WC to include supraclavicular, deep and superficial abdominal, inguinal/axillary anastomsis and LE.  Manual completed anterior only to B LE.   Education on donning ankle and leg juxtafit.  The thigh were short and needs to be extra short to fit the patient correctly.  Therapist had pt record the therapist donning the juxta so that aide would have video to refer to while attempting to don juxtafit.  Therapist recommended aide coming to session but pt states aide refused.     HOME EXERCISE PROGRAM:Sitting:  Ankle pumps, LAQ, hip ab/adduction, marching and diaphragm breathing. (Note pt is only able to complete less than a 1/4 of ROM due to spastic CP, however, therapist encouraged pt to complete 2x /day as this will still increase lymphatic circulation, maybe not as much as if she could complete full ROM but something is better than nothing.)  ASSESSMENT:  CLINICAL IMPRESSION:    PT edema is lower than last week but not at lowest point that she has been.  PT aide has not been donning the juxta fit as she did not know how and wanted the pt to gain instruction from therapist prior to donning.  PT foot and leg fit well, pt will need an extra short for the thigh juxta.  Therapist instructed pt to pump daily, twice if able,(pt is dependent on another person assisting her with the pump as she has CP), and donning juxta everyday.  Once pt has thigh high juxta we will discharge to self care. OBJECTIVE IMPAIRMENTS: decreased ROM, increased edema, and pain.   ACTIVITY LIMITATIONS: dressing and hygiene/grooming  PERSONAL FACTORS: Fitness and 1 comorbidity: spastic CP are also affecting patient's functional outcome.   REHAB POTENTIAL: Good  CLINICAL DECISION MAKING: Evolving/moderate complexity  EVALUATION COMPLEXITY: Moderate  GOALS: Goals reviewed with patient? No  SHORT TERM GOALS: Target date: 04/27/24  PT to be I  with HEP Baseline: Goal status: met  2.  PT to have lost 2 cm in foot and LE to reduce risk of cellulitis  Baseline:  Goal  status: met   LONG TERM GOALS: Target date: 05/18/24  PT pain to decrease to no greater than a 2 Baseline:  Goal status: met  2.  PT to have lost 4cm  foot measurement to be able to fit into a shoe. Baseline:  Goal status: on-going  3.  PT leg measurements to be decreased 3-4 cm to allow improved skin integrity. (Currently very dry and slighty red).  Baseline:  Goal status: partially met    PLAN:  PT FREQUENCY: 2x/week  PT DURATION: 6 weeks  PLANNED INTERVENTIONS: 97110-Therapeutic exercises, 97535- Self Care, and 02859- Manual therapy  PLAN FOR NEXT SESSION: measure and instruct in donning of thigh high juxta.  Montie Metro, PT CLT [phone]     06/30/2024, 12:37 PM

## 2024-07-20 ENCOUNTER — Telehealth (HOSPITAL_COMMUNITY): Payer: Self-pay | Admitting: Physical Therapy

## 2024-07-20 ENCOUNTER — Encounter (HOSPITAL_COMMUNITY): Admitting: Physical Therapy

## 2024-07-20 NOTE — Telephone Encounter (Signed)
 Pt states that she did not have a ride.  Pt called and left a voicemail  Montie Metro, PT CLT [phone]

## 2024-07-21 ENCOUNTER — Encounter (HOSPITAL_COMMUNITY): Payer: Self-pay | Admitting: Physical Therapy

## 2024-07-21 NOTE — Therapy (Signed)
 Pt called therapist stating that she is pumping everyday, donning the juxtafit foot and leg wraps and elevating in her wheelchair.  Her legs are doing well, however her feet are increasing in edema; the foot wraps are not enough to contain the swelling.   Therapist  recommends night garment re medi-profile to attempt to contain foot edema to allow pt to wear shoes as this is her goal so that she is able to work on standing more.  PT is limited in mobility due to CP.  Montie Metro, PT CLT [phone]

## 2024-07-27 ENCOUNTER — Ambulatory Visit (HOSPITAL_COMMUNITY): Admitting: Physical Therapy

## 2024-07-27 DIAGNOSIS — I890 Lymphedema, not elsewhere classified: Secondary | ICD-10-CM | POA: Diagnosis not present

## 2024-07-27 DIAGNOSIS — M6281 Muscle weakness (generalized): Secondary | ICD-10-CM

## 2024-07-27 DIAGNOSIS — G800 Spastic quadriplegic cerebral palsy: Secondary | ICD-10-CM

## 2024-07-27 NOTE — Therapy (Signed)
 OUTPATIENT PHYSICAL THERAPY LYMPHEDEMA TREATMENT/Discharge  Patient Name: Christine Gomez MRN: [id number] DOB:[birth year], 38 y.o., female Today's Date: 07/27/2024 PHYSICAL THERAPY DISCHARGE SUMMARY  Visits from Start of Care: 22  Current functional level related to goals / functional outcomes: Goals met   Remaining deficits: Dorsal foot hump B but smaller   Education / Equipment: HEP, pump, juxtafit    Patient agrees to discharge. Patient goals were met. Patient is being discharged due to being pleased with the current functional level.    END OF SESSION:  PT End of Session - 07/27/24 1201     Visit Number 22    Number of Visits 22    Date for PT Re-Evaluation 07/08/24    Authorization Type [employer]    Authorization Time Period 11 visits approved from 6/13=-9/10    Authorization - Visit Number 5    Authorization - Number of Visits 11    Progress Note Due on Visit 29    PT Start Time 0924    PT Stop Time 1020    PT Time Calculation (min) 56 min    Activity Tolerance Patient tolerated treatment well    Behavior During Therapy WFL for tasks assessed/performed                   Past Medical History:  Diagnosis Date   Acid reflux    Cerebral palsy (HCC)    Past Surgical History:  Procedure Laterality Date   dorsal rhizotomy  1996   back ligament looosening surgery   ESOPHAGOGASTRODUODENOSCOPY (EGD) WITH PROPOFOL  N/A 10/08/2016   Procedure: ESOPHAGOGASTRODUODENOSCOPY (EGD) WITH PROPOFOL ;  Surgeon: Elsie Cree, MD;  Location: [facility];  Service: Endoscopy;  Laterality: N/A;   left arm surgery  03/2021   Patient Active Problem List   Diagnosis Date Noted   Dysphagia 05/31/2021   Epigastric pain 05/31/2021   Gastroesophageal reflux disease 05/31/2021   Hematemesis 05/31/2021   Hiatal hernia 05/31/2021   Microcytic anemia 05/31/2021   Sagittal band rupture, extensor tendon, nontraumatic, left 05/01/2021   Cerebral palsy (HCC) 04/25/2020   Contracture  of forearm joint 04/25/2020   Other acquired deformity of other parts of limb 04/25/2020   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  She has not returned her thigh juxtafit yet or called [facility]over.   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  Pt received 19 treatments of PT in [place] last year attempting to improve her mobility  PAIN:  Are you having pain? Only with pressure, 5/10   PRECAUTIONS: Other: cellulitis  RED FLAGS: None   WEIGHT BEARING RESTRICTIONS: No; pt needs assistance with transfers uses a wheelchair for locomotion.     FALLS:  Has patient fallen in last 6 months? No  LIVING ENVIRONMENT: Lives with: roomates who do not assist in her care.  She has an aide for 3 hours in the mornings and voc rehab for 4 hours in the evenings.  House/apartment Stairs: No Has following equipment at home: Wheelchair (power)   PRIOR LEVEL OF FUNCTION: Requires assistive device for independence  PATIENT GOALS: To be able to wear shoes again   OBJECTIVE: Note: Objective measures were completed at Evaluation unless otherwise  noted.  COGNITION: Overall cognitive status: Within functional limits for tasks assessed   PALPATION: Noted induration of LE with noted B dorsal hump on feet.    LYMPHEDEMA ASSESSMENTS:     LE LANDMARK RIGHT eval 04/15/24 04/22/24 04/29/24 05/06/24 05/13/24 05/20/24 05/27/24 06/02/24 06/10/24 06/24/24 06/30/24 07/27/24  At groin               30 cm proximal to suprapatella               20 cm proximal to suprapatella               10 cm proximal to suprapatella               At midpatella / popliteal crease               30 cm proximal to floor at lateral plantar foot 40.2 39 38 37 36.3 36.7 37.5 38.5 38 38 38.5 38 35.3  20 cm proximal to floor at lateral plantar foot 34.2 33 31.8 31 31.2 30.5 30 32 31.5 31 34.1 32.5 28.8  10 cm proximal to floor at lateral plantar foot 27 26.5 25.3 25.3 24.4 25 24 25  25.7 25.2 27.7 26.7 23.7  Circumference of ankle/heel 33 31.5 30.5 29.7 31.5 31 30.8 31 31.3 30.7 32.7 32 29.4  5 cm proximal to 1st MTP joint 27 25.6 23.5 23.4 24 24.2 24 23.8 23.8 24 26.7 25 23.5  Across MTP joint 23.9 24.2 22.5 21.5 23 20.8 21.8 21.8 21.7 21.7 25 23  22.5  Around proximal great toe 8.5 8.1 8.1 8.1 8 8.3 8.5 8.6 8.4   8.6 9.2  (Blank rows = not tested)  LE LANDMARK LEFT eval 04/15/24 04/22/24 04/29/24 05/06/24 05/13/24 05/20/24 05/27/24 06/02/24 06/10/24 06/24/24 06/30/24 07/27/24  At groin               30 cm proximal to suprapatella               20 cm proximal to suprapatella               10 cm proximal to suprapatella               At midpatella / popliteal crease               30 cm proximal to floor at lateral  plantar foot 42.1 39.7 39 38.2 40 39 37.5 39.3 38.7  38.7 40.3 39.9 38  20 cm proximal to floor at lateral plantar foot 34.7 35 31.5 31.5 32.5 31.8 30.7 32.3 31 30.2 34 33 30  10 cm proximal to floor at lateral plantar foot 26.6 25.7 24.3 23.5 24.5 25 24.3 25 24.2 23.5 26.4 25 23.2  Circumference of ankle/heel 32.2 31.6 29 29  30.5 30.1 29.7 31 29.6 29.8 32 31.2 29.7  5 cm  proximal to 1st MTP joint 25.2 24.4 23.1 22.4 23.5 24 23.3 23 23.5 23.5 25 24.5 23.5  Across MTP joint 22.3 22 21.7 22.3 22.4 22.3 23 22.5 22.8 23.2 24.4 23.3 22.2  Around proximal great toe 8.8 8.1 8.1 8 8.2  8.4 8.4 8.7 8.3 8.8 8.3 8.4  (Blank rows = not tested)    TODAY'S TREATMENT:                                                                                                                              DATE:  07/27/24: measurement  Manual decongestive techniques anterior only, reclined in power WC to include supraclavicular, deep and superficial abdominal, inguinal/axillary anastomsis and LE.  Manual completed anterior only to B LE.    donning ankle and leg juxtafit.  Review of discharge instructions.  HOME EXERCISE PROGRAM:Sitting:  Ankle pumps, LAQ, hip ab/adduction, marching and diaphragm breathing. (Note pt is only able to complete less than a 1/4 of ROM due to spastic CP, however, therapist encouraged pt to complete 2x /day as this will still increase lymphatic circulation, maybe not as much as if she could complete full ROM but something is better than nothing.)  ASSESSMENT:  CLINICAL IMPRESSION:    PT edema is lower  except in her foot.  Pt has stubborn edema and has B dorsal hump in feet.  Therapist recommends a night garment for pt.  Pt has been pumping daily and wearing juxtafit daily.  Unable to complete exercises and manual fully due to CP and spasticity but tries the best that she can.  Pt goal is to get shoes on so she can work on standing for health of her bones and mm.   OBJECTIVE IMPAIRMENTS: decreased ROM, increased edema, and pain.   ACTIVITY LIMITATIONS: dressing and hygiene/grooming  PERSONAL FACTORS: Fitness and 1 comorbidity: spastic CP are also affecting patient's functional outcome.   REHAB POTENTIAL: Good  CLINICAL DECISION MAKING: Evolving/moderate complexity  EVALUATION COMPLEXITY: Moderate  GOALS: Goals reviewed with patient? No  SHORT TERM GOALS:  Target date: 04/27/24  PT to be I with HEP Baseline: Goal status: met  2.  PT to have lost 2 cm in foot and LE to reduce risk of cellulitis  Baseline:  Goal status: met   LONG TERM GOALS: Target date: 05/18/24  PT pain to decrease to no greater than a 2 Baseline:  Goal status: met  2.  PT to have lost 4cm  foot  measurement to be able to fit into a shoe. Baseline:  Goal status: on-going- could get her tennis shoe on for 4 hours Sunday, pt states that she can not remember when she had a tennis shoe on.   3.  PT leg measurements to be decreased 3-4 cm to allow improved skin integrity. (Currently very dry and slighty red).  Baseline:  Goal status: met    PLAN:  PT FREQUENCY: 2x/week  PT DURATION: 6 weeks  PLANNED INTERVENTIONS: 97110-Therapeutic exercises, 97535- Self Care, and 02859- Manual therapy  PLAN FOR NEXT SESSION: Discharge. Send order for night time garment as pt will benefit from a night garment. Montie Metro, PT CLT [phone]     07/27/2024, 12:15 PM

## 2024-07-29 ENCOUNTER — Encounter (HOSPITAL_COMMUNITY)

## 2024-08-03 ENCOUNTER — Encounter (HOSPITAL_COMMUNITY): Admitting: Physical Therapy

## 2024-08-10 ENCOUNTER — Encounter (HOSPITAL_COMMUNITY): Admitting: Physical Therapy

## 2024-08-12 ENCOUNTER — Encounter (HOSPITAL_COMMUNITY): Admitting: Physical Therapy

## 2024-08-17 ENCOUNTER — Encounter (HOSPITAL_COMMUNITY): Admitting: Physical Therapy

## 2024-08-24 ENCOUNTER — Encounter (HOSPITAL_COMMUNITY): Admitting: Physical Therapy

## 2024-08-26 ENCOUNTER — Encounter (HOSPITAL_COMMUNITY): Admitting: Physical Therapy
